# Patient Record
Sex: Female | Born: 1980
Health system: Southern US, Community
[De-identification: ages and names within clinical notes are randomized; demographics above are authoritative.]

## PROBLEM LIST (undated history)

## (undated) DIAGNOSIS — O24419 Gestational diabetes mellitus in pregnancy, unspecified control: Secondary | ICD-10-CM

## (undated) DIAGNOSIS — M25569 Pain in unspecified knee: Secondary | ICD-10-CM

## (undated) DIAGNOSIS — E119 Type 2 diabetes mellitus without complications: Secondary | ICD-10-CM

## (undated) DIAGNOSIS — F319 Bipolar disorder, unspecified: Secondary | ICD-10-CM

## (undated) DIAGNOSIS — F32A Depression, unspecified: Secondary | ICD-10-CM

## (undated) DIAGNOSIS — F329 Major depressive disorder, single episode, unspecified: Secondary | ICD-10-CM

## (undated) DIAGNOSIS — M549 Dorsalgia, unspecified: Secondary | ICD-10-CM

## (undated) HISTORY — PX: NO PAST SURGERIES: SHX2092

---
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
## 2009-07-13 ENCOUNTER — Emergency Department (HOSPITAL_COMMUNITY): Admission: EM | Admit: 2009-07-13 | Discharge: 2009-07-13 | Payer: Self-pay | Admitting: Emergency Medicine

## 2009-07-13 ENCOUNTER — Inpatient Hospital Stay (HOSPITAL_COMMUNITY): Admission: AD | Admit: 2009-07-13 | Discharge: 2009-07-15 | Payer: Self-pay | Admitting: Psychiatry

## 2009-07-13 ENCOUNTER — Ambulatory Visit: Payer: Self-pay | Admitting: Psychiatry

## 2009-11-04 ENCOUNTER — Ambulatory Visit: Payer: Self-pay | Admitting: Internal Medicine

## 2009-11-09 ENCOUNTER — Ambulatory Visit (HOSPITAL_COMMUNITY): Admission: RE | Admit: 2009-11-09 | Discharge: 2009-11-09 | Payer: Self-pay | Admitting: Family Medicine

## 2009-12-09 ENCOUNTER — Ambulatory Visit: Payer: Self-pay | Admitting: Internal Medicine

## 2009-12-09 ENCOUNTER — Encounter (INDEPENDENT_AMBULATORY_CARE_PROVIDER_SITE_OTHER): Payer: Self-pay | Admitting: Family Medicine

## 2009-12-09 LAB — CONVERTED CEMR LAB
ALT: 30 units/L (ref 0–35)
AST: 19 units/L (ref 0–37)
Albumin: 5.1 g/dL (ref 3.5–5.2)
Alkaline Phosphatase: 83 units/L (ref 39–117)
BUN: 14 mg/dL (ref 6–23)
Basophils Absolute: 0 10*3/uL (ref 0.0–0.1)
Basophils Relative: 0 % (ref 0–1)
CO2: 23 meq/L (ref 19–32)
Calcium: 9.6 mg/dL (ref 8.4–10.5)
Chloride: 104 meq/L (ref 96–112)
Cholesterol: 216 mg/dL — ABNORMAL HIGH (ref 0–200)
Creatinine, Ser: 0.54 mg/dL (ref 0.40–1.20)
Eosinophils Absolute: 0.1 10*3/uL (ref 0.0–0.7)
Eosinophils Relative: 1 % (ref 0–5)
Glucose, Bld: 92 mg/dL (ref 70–99)
HCT: 40.4 % (ref 36.0–46.0)
HDL: 48 mg/dL (ref >39)
Hemoglobin: 13.6 g/dL (ref 12.0–15.0)
LDL Cholesterol: 145 mg/dL — ABNORMAL HIGH (ref 0–99)
Lymphocytes Relative: 31 % (ref 12–46)
Lymphs Abs: 1.8 10*3/uL (ref 0.7–4.0)
MCHC: 33.7 g/dL (ref 30.0–36.0)
MCV: 94.4 fL (ref 78.0–100.0)
Monocytes Absolute: 0.4 10*3/uL (ref 0.1–1.0)
Monocytes Relative: 7 % (ref 3–12)
Neutro Abs: 3.5 10*3/uL (ref 1.7–7.7)
Neutrophils Relative %: 60 % (ref 43–77)
Platelets: 299 10*3/uL (ref 150–400)
Potassium: 3.9 meq/L (ref 3.5–5.3)
RBC: 4.28 M/uL (ref 3.87–5.11)
RDW: 13.2 % (ref 11.5–15.5)
Sodium: 139 meq/L (ref 135–145)
Total Bilirubin: 0.4 mg/dL (ref 0.3–1.2)
Total CHOL/HDL Ratio: 4.5
Total Protein: 7.9 g/dL (ref 6.0–8.3)
Triglycerides: 114 mg/dL (ref <150)
VLDL: 23 mg/dL (ref 0–40)
WBC: 5.7 10*3/uL (ref 4.0–10.5)

## 2009-12-18 ENCOUNTER — Ambulatory Visit: Payer: Self-pay | Admitting: Internal Medicine

## 2010-01-18 ENCOUNTER — Ambulatory Visit: Payer: Self-pay | Admitting: Internal Medicine

## 2010-05-04 ENCOUNTER — Ambulatory Visit: Payer: Self-pay | Admitting: Internal Medicine

## 2010-12-15 LAB — RAPID URINE DRUG SCREEN, HOSP PERFORMED
Amphetamines: NOT DETECTED
Barbiturates: NOT DETECTED
Benzodiazepines: NOT DETECTED
Cocaine: NOT DETECTED
Opiates: NOT DETECTED
Tetrahydrocannabinol: NOT DETECTED

## 2010-12-15 LAB — URINALYSIS, ROUTINE W REFLEX MICROSCOPIC
Bilirubin Urine: NEGATIVE
Glucose, UA: NEGATIVE mg/dL
Hgb urine dipstick: NEGATIVE
Ketones, ur: 15 mg/dL — AB
Nitrite: NEGATIVE
Protein, ur: NEGATIVE mg/dL
Specific Gravity, Urine: 1.018 (ref 1.005–1.030)
Urobilinogen, UA: 0.2 mg/dL (ref 0.0–1.0)
pH: 6 (ref 5.0–8.0)

## 2010-12-15 LAB — BASIC METABOLIC PANEL
CO2: 23 mEq/L (ref 19–32)
Chloride: 112 mEq/L (ref 96–112)
GFR calc Af Amer: >60 mL/min (ref >60)
Potassium: 3.6 mEq/L (ref 3.5–5.1)
Sodium: 144 mEq/L (ref 135–145)

## 2010-12-15 LAB — CBC
HCT: 37.5 % (ref 36.0–46.0)
Hemoglobin: 13.2 g/dL (ref 12.0–15.0)
MCHC: 35.2 g/dL (ref 30.0–36.0)
MCV: 91.2 fL (ref 78.0–100.0)
RBC: 4.11 MIL/uL (ref 3.87–5.11)
WBC: 7.4 10*3/uL (ref 4.0–10.5)

## 2010-12-15 LAB — ETHANOL: Alcohol, Ethyl (B): 122 mg/dL — ABNORMAL HIGH (ref 0–10)

## 2011-01-28 NOTE — Discharge Summary (Signed)
[facility]  Patient:    Catherine Morris, Catherine Morris Visit Number: [id number] MRN: [id number]          Service Type: OBS Location: 910D [id number] [id number] Attending Physician:  Antionette Char Dictated by:   Lucille Passy, M.D. Admit Date:  09/06/2001 Disc. Date: 09/10/01                             Discharge Summary  DATE OF BIRTH:                04-Sep-1981  DISCHARGE DIAGNOSES:          1. Cervicitis.                               2. Intrauterine pregnancy at 28 weeks.  DISCHARGE MEDICATIONS:        1. Augmentin 500 mg p.o. b.i.d. x 4 days.                               2. Flagyl 500 mg p.o. b.i.d. x 4 days.  HISTORY OF PRESENT ILLNESS:   Catherine Morris is an 30 year old, G1, P0, who presented at 28 weeks and 2 days to the maternal admissions unit with lower abdominal pain and vaginal bleeding.  She had good fetal movement,  no fluid leakage, and was not feeling strong contractions.  She had had no prenatal care.  The cervix was closed, long, and firm on sterile vaginal exam and a wet prep showed a few yeast, many clue cells, moderate wbcs, and bacteria too numerous to count with bleeding secondary to cervical friability. Fetal heart tones on admission were reassuring.  HOSPITAL COURSE:              The patient was admitted for IV antibiotics and was placed on ampicillin as the hospital pharmacy was out of Unasyn.  She was stable during the hospitalization, feeling no contractions, and her cervix remained closed, long, and thick.  She completed a 72-hour course of ampicillin IV.  On the day of discharge, her cervix was still closed, so she was discharged on a four-day course of both Augmentin and Flagyl b.i.d.  An appointment was made for her to follow up one week after discharge at the [facility] prior to discharge. Dictated by:   Lucille Passy, M.D. Attending Physician:  Antionette Char DD:  09/10/01 TD:   09/10/01 Job: [id number]

## 2014-01-14 ENCOUNTER — Encounter (HOSPITAL_COMMUNITY): Payer: Self-pay | Admitting: Emergency Medicine

## 2014-01-14 ENCOUNTER — Emergency Department (INDEPENDENT_AMBULATORY_CARE_PROVIDER_SITE_OTHER)
Admission: EM | Admit: 2014-01-14 | Discharge: 2014-01-14 | Disposition: A | Discharge status: Home / Self Care | Payer: Self-pay | Source: Home / Self Care

## 2014-01-14 DIAGNOSIS — R519 Headache, unspecified: Secondary | ICD-10-CM

## 2014-01-14 DIAGNOSIS — R51 Headache: Secondary | ICD-10-CM

## 2014-01-14 HISTORY — DX: Depression, unspecified: F32.A

## 2014-01-14 HISTORY — DX: Pain in unspecified knee: M25.569

## 2014-01-14 HISTORY — DX: Major depressive disorder, single episode, unspecified: F32.9

## 2014-01-14 HISTORY — DX: Dorsalgia, unspecified: M54.9

## 2014-01-14 HISTORY — DX: Bipolar disorder, unspecified: F31.9

## 2014-01-14 MED ORDER — KETOROLAC TROMETHAMINE 60 MG/2ML IM SOLN
60.0000 mg | Freq: Once | INTRAMUSCULAR | Status: AC
  Administered 2014-01-14: 60 mg via INTRAMUSCULAR

## 2014-01-14 MED ORDER — ONDANSETRON 4 MG PO TBDP
4.0000 mg | ORAL_TABLET | Freq: Once | ORAL | Status: AC
  Administered 2014-01-14: 4 mg via ORAL

## 2014-01-14 MED ORDER — DEXAMETHASONE SODIUM PHOSPHATE 10 MG/ML IJ SOLN
5.0000 mg | Freq: Once | INTRAMUSCULAR | Status: AC
  Administered 2014-01-14: 5 mg via INTRAMUSCULAR

## 2014-01-14 MED ORDER — ONDANSETRON 4 MG PO TBDP
ORAL_TABLET | ORAL | Status: AC
  Filled 2014-01-14: qty 1

## 2014-01-14 MED ORDER — KETOROLAC TROMETHAMINE 60 MG/2ML IM SOLN
INTRAMUSCULAR | Status: AC
  Filled 2014-01-14: qty 2

## 2014-01-14 MED ORDER — DEXAMETHASONE SODIUM PHOSPHATE 10 MG/ML IJ SOLN
INTRAMUSCULAR | Status: AC
  Filled 2014-01-14: qty 1

## 2014-01-14 NOTE — Discharge Instructions (Signed)
Dolor de Pensions consultant, preguntas frecuentes y sus respuestas (Headaches, Frequently Asked Questions) CEFALEAS MIGRAOSAS P: Qu es la migraa? Qu la ocasiona? Cmo puedo tratarla? R: En general, la migraa comienza como un dolor apagado. Luego progresa hacia un dolor, constante, punzante y como un latido. Sentir Copy las sienes. Podr sentir Aeronautical engineer parte anterior o posterior de la cabeza, o en uno o ambos lados. El dolor suele estar acompaado de una combinacin de:  Nuseas.  Vmitos.  Sensibilidad a la luz y los ruidos. Algunas personas (un 15%) experimentan un aura (ver abajo) antes de un ataque. La causa de la migraa se debe a reacciones qumicas del cerebro. El tratamiento para la migraa puede incluir medicamentos de [place]. Tambin puede incluir tcnicas de [place]. Estas incluyen entrenamientos para la relajacin y biorretroalimentacin.  P: Qu es un aura? R: Alrededor del 15% de las personas con migraa tiene un "aura". Es una seal de sntomas neurolgicos que ocurren antes de un dolor de cabeza por migraas. Podr ver lneas onduladas o irregulares, puntos o luces parpadeantes. Podr experimentar visin de tnel o puntos ciegos en uno o ambos ojos. El aura puede incluir alucinaciones visuales o auditivas (algo que se imagina). Puede incluir trastornos en el olfato (como olores extraos), el tacto o el gusto. Entre otros sntomas se incluyen:  Adormecimiento.  Sensacin de hormigueo.  Dificultad para recordar o Tax adviser. Estos episodios neurolgicos pueden durar hasta 60 minutos. Los sntomas desaparecern a medida que el dolor de cabeza comience. P:Qu es un disparador? R: Ciertos factores fsicos o Best boy a "disparar" una migraa. Estos son:  Alimentos.  Cambios hormonales.  Clima.  Estrs. Es importante recordar que los disparadores son diferentes entre si. Para ayudar a prevenir ataques de migraas, necesitar  descubrir cules son los Engineer, civil (consulting). Lleve un diario sobre sus dolores de [place]. Este es un buen modo para descubrir los disparadores. El Visual merchandiser en el momento de hablar con el profesional acerca de su enfermedad. P: El clima afecta en las migraas? R: La luz solar, el calor, la humedad y lo cambios drsticos en la presin Doctor, hospital a, o "disparar" un ataque de migraa en [employer]. Pero estudios han demostrado que el clima no acta como disparador para todas las personas con [place]. P: Cul es la relacin entre la migraa y la hormonas? R: Las hormonas inician y [place] funciones corporales. Las hormonas [employer] balance en el cuerpo dentro de los constantes cambios de [place]. Algunas veces, el nivel de hormonas en el cuerpo se desbalancea. Por ejemplo, durante la menstruacin, el embarazo o la [place]. Pueden ser la causa de un ataque de migraa. De hecho, alrededor de tres cuartos de las mujeres con migraa informan que sus ataques estn relacionados con el ciclo menstrual.  P: Aumenta el riesgo de sufrir un choque cardaco en las personas que padecen migraa? R: La probabilidad de que un ataque de migraa ocasione un ataque cardaco es muy remota. Esto no quiere Google persona que sufre de migraa no pueda tener un ataque cardaco asociado con ella. En las personas menores de 40 aos, el factor ms comn para un ataque es la [place]. Pero durante la vida de una persona, la ocurrencia de un dolor de cabeza por migraa est asociada con una reduccin en el riesgo de morir por un ataque cerebrovascular.  P: Cules son los medicamentos para la migraa? R: La  medicación precisa se utiliza para tratar el dolor de cabeza una vez que ha comenzado. Son ejemplos, medicamentos de venta libre, desinflamatorios sin esteroides, ergotamínicos y triptanos.  °P: ¿Qué son los triptanos? °R: Lo triptanos son una nueva clase de  medicamentos abortivos. Son específicos para tratar este problema. Los triptanos son vasoconstrictores. Moderan algunas reacciones químicas del cerebro. Los triptanos trabajan como receptores del cerebro. Ayudan a restaurar el balance de un neurotransmisor denominado serotonina. Se cree que las fluctuaciones en los niveles de serotonina son la causa principal de la migraña.  °P: ¿Son efectivos los medicamentos de venta libre para la migraña? °R: Los medicamentos de venta libre pueden ser efectivos para aliviar dolores leves a moderados y los síntomas asociados a la migraña. Pero deberá consultar a un médico antes de comenzar cualquier tratamiento para la migraña.  °P: ¿Cuáles son los medicamentos de prevención de la migraña? °R: Se suele denominar tratamiento "profiláctico" a los medicamentos para la prevención de la migraña. Se utilizan para reducir la frecuencia, gravedad y duración de los ataques de migraña. Son ejemplos de medicamentos de prevención: antiepilépticos, antidepresivos, bloqueadores beta, bloqueadores de los canales de calcio y medicamentos antiinflamatorios sin esteroides. °P: ¿ Por qué se utilizan anticonvulsivantes para tratar la migraña? °R: Durante los últimos años, ha habido un creciente interés en las drogas antiepilépticas para la prevención de la migraña. A menudo se los conoce como "anticonvulsivantes". La epilepsia y la migraña suceden por reacciones similares en el cerebro.  °P: ¿ Por qué se utilizan antidepresivos para tratar la migraña? °R: Los antidepresivos típicamente se utilizan para tratar a las personas con depresión. Pueden reducir la frecuencia de la migraña a través de la regulación de los niveles químicos, como la serotonina, en el cerebro.  °P: ¿ Por qué se utilizan terapias alternativas para tratar la migraña? °R: El término "terapias alternativas" suelen utilizarse para describir los tratamientos que se considera que están por fuera de alcance la medicina occidental  convencional. Son ejemplos de las terapias alternativas: la acupuntura, la acupresión y el yoga. Otra terapia alternativa común es la terapia herbal. Se cree que algunas hierbas ayudan a aliviar los dolores de cabeza. Siempre consulte con el profesional acerca de las terapias alternativas antes de utilizarlas. Algunos productos herbales contienen arsénico y otras toxinas. °DOLORES DE CABEZA POR TENSIÓN °P: ¿Qué es un dolor de cabeza por tensión? ¿Qué lo ocasiona? ¿Cómo puedo tratarlo? °R: Los dolores de cabeza por tensión ocurren al azar. A menudo son el resultado de estrés temporario, ansiedad, fatiga o ira. Los síntomas incluyen dolor en las sienes, una sensación como de tener una banda alrededor de la cabeza (un dolor que "presiona"). Los síntomas pueden incluir una sensación de empuje, de presión y contracción de los músculos de la cabeza y el cuello. El dolor comienza en la frente, sienes o en la parte posterior de la cabeza y el cuello. El tratamiento para los dolores de cabeza por tensión puede incluir medicamentos de venta libre. También puede incluir técnicas de autoayuda con entrenamientos para la relajación y biorretroalimentación. °CEFALEA EN RACIMOS °P: ¿Qué es una cefalea en racimos? ¿Qué la ocasiona? ¿Cómo puedo tratarla? °R: La cefalea en racimos toma su nombre debido a que los ataques vienen en grupos. El dolor aparece con poco o ningún aviso. Normalmente ocurre de un lado de la cabeza. Muchas veces el dolor viene acompañado de un lagrimeo u ojo rojo y goteo de la nariz del mismo lado que el dolor. Se cree que la causa es   una reacción en las sustancias químicas del cerebro. Se describe como el caso más grave e intenso de cualquier tipo de dolor de cabeza. El tratamiento incluye medicamentos bajo receta y oxígeno. °CEFALEA SINUSAL °P: ¿Qué es una cefalea sinusal? ¿Qué la ocasiona? ¿Cómo puedo tratarla? °R: Cuando se inflama una cavidad en los huesos de la cara y el cráneo (sinus) ocasiona un dolor  localizado. Esta enfermedad generalmente es el resultado de una reacción alérgica, un tumor o una infección. Si el dolor de cabeza está ocasionado por un bloqueo del sinus, como una infección, probablemente tendrá fiebre. Una imagen de rayos X confirmará el bloqueo del sinus. El tratamiento indicado por el médico podrá incluir antibióticos para la infección, y también antihistamínicos o descongestivos.  °DOLOR DE CABEZA POR EFECTO "REBOTE" °P: ¿Qué es un dolor de cabeza por efecto "rebote"? ¿Qué lo ocasiona? ¿Cómo puedo tratarlo? °R: Si se toman medicamentos para el dolor de cabeza muy a menudo puede llevar a la enfermedad conocida como "dolor de cabeza por rebote". Un patrón de abuso de medicamentos para el dolor de cabeza supone tomarlos más de dos veces por semana o en cantidades excesivas. Esto significa más que lo que indica el envase o el médico. Con los dolores de cabeza por rebote, los medicamentos no sólo dejan de aliviar el dolor sino que además comienzan a ocasionar dolores de cabeza. Los médicos tratan los dolores de cabeza por rebote mediante la disminución del medicamento del que se ha abusado. A veces el medico podrá sustituir gradualmente por un tipo diferente de tratamiento o medicación. Dejar de consumirlo podría ser difícil. El abuso regular de un medicamento aumenta el potencial que se produzcan efectos secundarios graves. Consulte con un médico si utiliza regularmente medicamentos para el dolor de cabeza más de dos días por semana o más de lo que indica el envase. °PREGUNTAS Y RESPUESTAS ADICIONALES °P: ¿Qué es la biorretroalimentación? °R: La biorretroalimentación es un tratamiento de autoayuda. La biorretroalimentación utiliza un equipamiento especial para controlar los movimientos involuntarios del cuerpo y las respuestas físicas. La biorretroalimentación controla: °· Respiración. °· Pulso. °· Latidos cardíacos. °· Temperatura. °· Tensión muscular. °· Actividad cerebrales. °La  biorretroalimentación le ayudará a mejorar y perfeccionar sus ejercicios de relajación. Aprenderá a controlar las respuestas físicas relacionadas con el estrés. Una vez que se dominan las técnicas no necesitará más el equipamiento. °P: ¿Son hereditarios los dolores de cabeza? °R: Según algunas estimaciones, aproximadamente 28 millones de estadounidenses sufren migraña. Cuatro de cada cinco (80%) informan una historia familiar de migraña. Los investigadores no pueden asegurar si se trata de un problema genético o una predisposición familiar. A pesar de esto, un niño tiene 50% de probabilidades de sufrir migraña si uno de sus padres la sufre. El niño tiene un 75% de probabilidades si ambos padres la sufren.  °P. ¿Puede un niño tener migraña? °R: En el momento de ingresar a la escuela secundaria, la mayoría de los jóvenes han experimentado algún tipo de cefalea. Algunos abordajes o medicamentos seguros y efectivos pueden evitar las cefaleas o detenerlas luego de que han comenzado.  °P. ¿Qué tipo de especialista debe ver para diagnosticar y tratar una cefalea? °R: Comience con su médico de cabecera. Converse acerca de su experiencia y abordaje de las cefaleas. Comente los métodos de clasificación, diagnóstico y tratamiento. El profesional decidirá si lo derivará a un especialista, según los síntomas u otras enfermedades. El hecho de sufrir diabetes, alergias, etc, puede requerir un abordaje más complejo. La National Headache Foundation (Fundación Nacional   para las [address]efaleas) proporcionar, a pedido, Agricultural engineeruna lista de los mdicos que son miembros de [place] estado. Document Released: 08/11/2008 Document Revised: 11/21/2011 [facility] Patient Information 2014 FlemingExitCare, MarylandLLC.  Cefalea de rebote (Headaches, Analgesic Rebound) Los analgsicos son medicamentos de prescripcin o de venta libre que se Insurance risk surveyorutilizan para Human resources officercontrolar el dolor, que incluye el dolor de Turkmenistancabeza. Sin embargo, el uso excesivo o el mal uso de estos medicamentos  puede producir cefaleas de rebote. Las cefaleas de rebote son cefaleas que vuelven a ocurrir una vez que se va el efecto del medicamento analgsico. [place] CityFinalmente, las cefaleas de rebote se vuelven trastornos de larga duracin (crnicos). Si esto ocurre, deber dejar de tomar medicamentos analgsicos. Si no lo hace, es probable que la cefalea crnica contine incluso si realiza Midwifecualquier otro tratamiento. Generalmente, al dejar de tomar analgsicos, la cefalea puede inicialmente empeorar durante varios [place]. Adems, podr sentir Programme researcher, broadcasting/film/videomalestar estomacal (nuseas), y podr tener vmitos. Luego de un perodo de 3 a 5 das, estos sntomas comienzan a Scientist, clinical (histocompatibility and immunogenetics)mejorar. En ocasiones la mejora puede llevar ms tiempo. Finalmente, la cefalea mejorar lentamente con un tratamiento basado en los medicamentos adecuados. La mayor parte de las personas puede dejar de Science writertomar analgsicos en su hogar con la supervisin de un profesional. Algunas personas pueden tener dificultades y pueden requerir la hospitalizacin. Document Released: 11/25/2008 Document Revised: 11/21/2011 [facility]Exit[facility] Patient Information 2014 [place] LakesExitCare, MarylandLLC.

## 2014-01-14 NOTE — ED Provider Notes (Signed)
Medical screening examination/treatment/procedure(s) were performed by resident physician or non-physician practitioner and as supervising physician I was immediately available for consultation/collaboration.   Jamil Armwood DOUGLAS MD.   Jamario Colina D Hailyn Zarr, MD 01/14/14 1634 

## 2014-01-14 NOTE — ED Notes (Signed)
Pt c/o frequent headaches. This episode onset yesterday afternoon. Patient reports she took tramadol with no relief. Patient is alert and oriented and in no acute distress.

## 2014-01-14 NOTE — ED Provider Notes (Signed)
CSN: [id number]     Arrival date & time 01/14/14  1210 History   First MD Initiated Contact with Patient 01/14/14 1357     Chief Complaint  Patient presents with  . Headache   (Consider location/radiation/quality/duration/timing/severity/associated sxs/prior Treatment) HPI Comments: 33 year old Hispanic female complaining of headache that worsened yesterday afternoon. It is located primarily across the frontal lobe radiating bilaterally to the temples and parietal. It is described as a throbbing pain. It is present most of the time. It occurs upon awakening, last drink today and the evening. She has chronic daily headaches for 5 years. This headache is similar but worse than previous headaches. Denies any recent trauma. The headache is worse when in upright lites such as the son. She denies nausea, vomiting, focal weakness, problems with vision, speech, hearing, swallowing or other neurologic problems. She has been seen by her PCP and given tramadol but that has not helped. The last time she saw her PCP was over a year ago and has not been back.   Past Medical History  Diagnosis Date  . Back pain   . Knee pain   . Depression   . Bipolar disorder    No past surgical history on file. No family history on file. History  Substance Use Topics  . Smoking status: Not on file  . Smokeless tobacco: Not on file  . Alcohol Use: Not on file   OB History   Grav Para Term Preterm Abortions TAB SAB Ect Mult Living                 Review of Systems  Constitutional: Positive for activity change. Negative for fever and fatigue.  HENT: Negative for congestion, ear pain, nosebleeds, rhinorrhea, sinus pressure and sore throat.   Eyes: Negative.   Respiratory: Negative.   Cardiovascular: Negative.   Gastrointestinal: Negative for nausea, vomiting and abdominal pain.  Genitourinary: Negative.   Musculoskeletal: Negative.   Skin: Negative.   Neurological: Positive for headaches. Negative for  dizziness, tremors, seizures, syncope, speech difficulty, weakness, light-headedness and numbness.  Psychiatric/Behavioral: Negative.     Allergies  Review of patient's allergies indicates no known allergies.  Home Medications   Prior to Admission medications   Medication Sig Start Date End Date Taking? Authorizing Provider  ALPRAZolam Prudy Feeler(XANAX) 0.25 MG tablet Take 0.25 mg by mouth at bedtime as needed for anxiety.    Historical Provider, MD  FLUoxetine (PROZAC) 10 MG capsule Take 10 mg by mouth daily.    Historical Provider, MD  traMADol (ULTRAM) 50 MG tablet Take 50 mg by mouth every 6 (six) hours as needed.    Historical Provider, MD   BP 110/72  Pulse 71  Temp(Src) 98.7 F (37.1 C) (Oral)  Resp 18  SpO2 100% Physical Exam  Nursing note and vitals reviewed. Constitutional: She is oriented to person, place, and time. She appears well-developed and well-nourished. No distress.  HENT:  Head: Normocephalic and atraumatic.  Mouth/Throat: Oropharynx is clear and moist. No oropharyngeal exudate.  Bilateral TMs are normal Tenderness over the temples and portions of the lower parietal scalp.   Eyes: Conjunctivae and EOM are normal. Pupils are equal, round, and reactive to light.  Neck: Normal range of motion. Neck supple. No thyromegaly present.  Cardiovascular: Normal rate, normal heart sounds and intact distal pulses.   No murmur heard. Pulmonary/Chest: Effort normal and breath sounds normal. No respiratory distress. She has no wheezes. She has no rales.  Abdominal: Soft. There is no tenderness. There  is no rebound.  Musculoskeletal: Normal range of motion. She exhibits no edema.  Lymphadenopathy:    She has no cervical adenopathy.  Neurological: She is alert and oriented to person, place, and time. She has normal strength. No cranial nerve deficit or sensory deficit. She exhibits normal muscle tone. She displays a negative Romberg sign. Coordination and gait normal. GCS eye subscore  is 4. GCS verbal subscore is 5. GCS motor subscore is 6.  Skin: Skin is warm and dry.  Psychiatric: She has a normal mood and affect.    ED Course  Procedures (including critical care time) Labs Review Labs Reviewed - No data to display  Imaging Review No results found.   MDM   1. Headache, chronic daily     Chronic daily headaches suspect associated with tension and possibly rebound. Patient is instructed to followup with her PCP's request referral to a neurologist. There is no evidence of neurologic deficits today nor in her history. We will treat with Toradol 60 mg and Decadron 5 mg IM, Zofran 4 mg by mouth. Headache instructions to include red flags. For any worsening in symptoms or problems to the emergency department.     Hayden Rasmussenavid Rosalynn Sergent, NP 01/14/14 1450

## 2014-02-20 ENCOUNTER — Ambulatory Visit: Payer: Self-pay | Attending: Internal Medicine | Admitting: Internal Medicine

## 2014-02-20 ENCOUNTER — Encounter: Payer: Self-pay | Admitting: Internal Medicine

## 2014-02-20 VITALS — BP 109/74 | HR 81 | Temp 98.7°F | Resp 17 | Wt 154.2 lb

## 2014-02-20 DIAGNOSIS — R519 Headache, unspecified: Secondary | ICD-10-CM

## 2014-02-20 DIAGNOSIS — M25569 Pain in unspecified knee: Secondary | ICD-10-CM | POA: Insufficient documentation

## 2014-02-20 DIAGNOSIS — F3289 Other specified depressive episodes: Secondary | ICD-10-CM | POA: Insufficient documentation

## 2014-02-20 DIAGNOSIS — Z139 Encounter for screening, unspecified: Secondary | ICD-10-CM | POA: Insufficient documentation

## 2014-02-20 DIAGNOSIS — F341 Dysthymic disorder: Secondary | ICD-10-CM

## 2014-02-20 DIAGNOSIS — F411 Generalized anxiety disorder: Secondary | ICD-10-CM | POA: Insufficient documentation

## 2014-02-20 DIAGNOSIS — R51 Headache: Secondary | ICD-10-CM | POA: Insufficient documentation

## 2014-02-20 DIAGNOSIS — F329 Major depressive disorder, single episode, unspecified: Secondary | ICD-10-CM | POA: Insufficient documentation

## 2014-02-20 DIAGNOSIS — F419 Anxiety disorder, unspecified: Secondary | ICD-10-CM

## 2014-02-20 DIAGNOSIS — M25561 Pain in right knee: Secondary | ICD-10-CM | POA: Insufficient documentation

## 2014-02-20 DIAGNOSIS — M25562 Pain in left knee: Secondary | ICD-10-CM

## 2014-02-20 LAB — CBC WITH DIFFERENTIAL/PLATELET
Basophils Absolute: 0 10*3/uL (ref 0.0–0.1)
Basophils Relative: 0 % (ref 0–1)
EOS ABS: 0.1 10*3/uL (ref 0.0–0.7)
Eosinophils Relative: 1 % (ref 0–5)
HCT: 37.8 % (ref 36.0–46.0)
Hemoglobin: 13.1 g/dL (ref 12.0–15.0)
LYMPHS ABS: 2.4 10*3/uL (ref 0.7–4.0)
LYMPHS PCT: 33 % (ref 12–46)
MCH: 30.4 pg (ref 26.0–34.0)
MCHC: 34.7 g/dL (ref 30.0–36.0)
MCV: 87.7 fL (ref 78.0–100.0)
Monocytes Absolute: 0.5 10*3/uL (ref 0.1–1.0)
Monocytes Relative: 7 % (ref 3–12)
NEUTROS PCT: 59 % (ref 43–77)
Neutro Abs: 4.3 10*3/uL (ref 1.7–7.7)
PLATELETS: 345 10*3/uL (ref 150–400)
RBC: 4.31 MIL/uL (ref 3.87–5.11)
RDW: 13.4 % (ref 11.5–15.5)
WBC: 7.3 10*3/uL (ref 4.0–10.5)

## 2014-02-20 LAB — HEMOGLOBIN A1C
HEMOGLOBIN A1C: 5.9 % — AB (ref <5.7)
MEAN PLASMA GLUCOSE: 123 mg/dL — AB (ref <117)

## 2014-02-20 MED ORDER — BUTALBITAL-APAP-CAFFEINE 50-325-40 MG PO TABS: 1.0000 | ORAL_TABLET | Freq: Four times a day (QID) | ORAL | Status: DC | PRN

## 2014-02-20 NOTE — Progress Notes (Signed)
Patient here with interpreter Complains of having headaches past five years Last scan of her head was in 2012 Stated she gets headaches everyday

## 2014-02-20 NOTE — Progress Notes (Signed)
Patient Demographics  Catherine Morris, is a 33 y.o. female  ZOX:[id number]  [id number]:[id number]  DOB - 1981/04/01  CC:  Chief Complaint  Patient presents with  . Headache       HPI: Catherine Morris is a 33 y.o. female here today to establish medical care. Patient has history of anxiety/depression following up with her her psychiatrist and is on Prozac Xanax, she also history of chronic headaches and has been taking Topamax 100 mg each bedtime as per patient is to let doesn't help her with the symptoms she denies any numbness weakness, she also reported to have bilateral knee pain for the more than 1 year, also history of fall in the past. As per patient she also recently had a Pap smear done and she is waiting on the results. Patient has No headache, No chest pain, No abdominal pain - No Nausea, No new weakness tingling or numbness, No Cough - SOB.  No Known Allergies Past Medical History  Diagnosis Date  . Back pain   . Knee pain   . Depression   . Bipolar disorder    Current Outpatient Prescriptions on File Prior to Visit  Medication Sig Dispense Refill  . ALPRAZolam (XANAX) 0.25 MG tablet Take 0.25 mg by mouth at bedtime as needed for anxiety.      Marland Kitchen. FLUoxetine (PROZAC) 10 MG capsule Take 10 mg by mouth daily.      . traMADol (ULTRAM) 50 MG tablet Take 50 mg by mouth every 6 (six) hours as needed.       No current facility-administered medications on file prior to visit.   Family History  Problem Relation Age of Onset  . Hypertension Mother   . Diabetes Father    History   Social History  . Marital Status: Single    Spouse Name: N/A    Number of Children: N/A  . Years of Education: N/A   Occupational History  . Not on file.   Social History Main Topics  . Smoking status: Never Smoker   . Smokeless tobacco: Not on file  . Alcohol Use: No  . Drug Use: Not on file  . Sexual Activity: Not on file   Other Topics Concern  . Not on file    Social History Narrative  . No narrative on file    Review of Systems: Constitutional: Negative for fever, chills, diaphoresis, activity change, appetite change and fatigue. HENT: Negative for ear pain, nosebleeds, congestion, facial swelling, rhinorrhea, neck pain, neck stiffness and ear discharge.  Eyes: Negative for pain, discharge, redness, itching and visual disturbance. Respiratory: Negative for cough, choking, chest tightness, shortness of breath, wheezing and stridor.  Cardiovascular: Negative for chest pain, palpitations and leg swelling. Gastrointestinal: Negative for abdominal distention. Genitourinary: Negative for dysuria, urgency, frequency, hematuria, flank pain, decreased urine volume, difficulty urinating and dyspareunia.  Musculoskeletal: Negative for back pain, joint swelling, arthralgia and gait problem. Neurological: Negative for dizziness, tremors, seizures, syncope, facial asymmetry, speech difficulty, weakness, light-headedness, numbness and headaches.  Hematological: Negative for adenopathy. Does not bruise/bleed easily. Psychiatric/Behavioral: Negative for hallucinations, behavioral problems, confusion, dysphoric mood, decreased concentration and agitation.    Objective:   Filed Vitals:   02/20/14 1201  BP: 109/74  Pulse: 81  Temp: 98.7 F (37.1 C)  Resp: 17    Physical Exam: Constitutional: Patient appears well-developed and well-nourished. No distress. HENT: Normocephalic, atraumatic, External right and left ear normal. Oropharynx is clear and moist.  Eyes: Conjunctivae and EOM are  normal. PERRLA, no scleral icterus. Neck: Normal ROM. Neck supple. No JVD. No tracheal deviation. No thyromegaly. CVS: RRR, S1/S2 +, no murmurs, no gallops, no carotid bruit.  Pulmonary: Effort and breath sounds normal, no stridor, rhonchi, wheezes, rales.  Abdominal: Soft. BS +, no distension, tenderness, rebound or guarding.  Musculoskeletal: Normal range of motion. No  edema and no tenderness. Left knee  Crepitation+  Neuro: Alert. Normal reflexes, muscle tone coordination. No cranial nerve deficit. Skin: Skin is warm and dry. No rash noted. Not diaphoretic. No erythema. No pallor. Psychiatric: Normal mood and affect. Behavior, judgment, thought content normal.  Lab Results  Component Value Date   WBC 5.7 12/09/2009   HGB 13.6 12/09/2009   HCT 40.4 12/09/2009   MCV 94.4 12/09/2009   PLT 299 12/09/2009   Lab Results  Component Value Date   CREATININE 0.54 12/09/2009   BUN 14 12/09/2009   NA 139 12/09/2009   K 3.9 12/09/2009   CL 104 12/09/2009   CO2 23 12/09/2009    No results found for this basename: HGBA1C   Lipid Panel     Component Value Date/Time   CHOL 216* 12/09/2009 2138   TRIG 114 12/09/2009 2138   HDL 48 12/09/2009 2138   CHOLHDL 4.5 Ratio 12/09/2009 2138   VLDL 23 12/09/2009 2138   LDLCALC 145* 12/09/2009 2138       Assessment and plan:   1. Anxiety and depression Following up with the psychiatrist and is on Xanax and Prozac.  2. Chronic headache Currently patient is on Topamax 100 mg each bedtime, prescribed Fioricet when necessary also refer to neurology - Ambulatory referral to Neurology - butalbital-acetaminophen-caffeine (ESGIC) 50-325-40 MG per tablet; Take 1 tablet by mouth every 6 (six) hours as needed for headache.  Dispense: 30 tablet; Refill: 0  3. Bilateral knee pain  - DG Knee Bilateral Standing AP; Future  4. Screening Baseline blood work  - CBC with Differential - COMPLETE METABOLIC PANEL WITH GFR - TSH - Vit D  25 hydroxy (rtn osteoporosis monitoring) - Hemoglobin A1c is  Return in about 3 months (around 05/23/2014) for Headache.  Doris Cheadle, MD

## 2014-02-21 ENCOUNTER — Telehealth: Payer: Self-pay

## 2014-02-21 LAB — COMPLETE METABOLIC PANEL WITH GFR
ALT: 23 U/L (ref 0–35)
AST: 18 U/L (ref 0–37)
Albumin: 4.4 g/dL (ref 3.5–5.2)
Alkaline Phosphatase: 109 U/L (ref 39–117)
BILIRUBIN TOTAL: 0.2 mg/dL (ref 0.2–1.2)
BUN: 12 mg/dL (ref 6–23)
CO2: 20 meq/L (ref 19–32)
Calcium: 9.8 mg/dL (ref 8.4–10.5)
Chloride: 106 mEq/L (ref 96–112)
Creat: 0.78 mg/dL (ref 0.50–1.10)
GFR, Est Non African American: >89 mL/min
Glucose, Bld: 87 mg/dL (ref 70–99)
Potassium: 4.4 mEq/L (ref 3.5–5.3)
SODIUM: 137 meq/L (ref 135–145)
TOTAL PROTEIN: 7.3 g/dL (ref 6.0–8.3)

## 2014-02-21 LAB — TSH: TSH: 1.517 u[IU]/mL (ref 0.350–4.500)

## 2014-02-21 LAB — VITAMIN D 25 HYDROXY (VIT D DEFICIENCY, FRACTURES): Vit D, 25-Hydroxy: 28 ng/mL — ABNORMAL LOW (ref 30–89)

## 2014-02-21 NOTE — Telephone Encounter (Signed)
Message copied by Lestine MountJUAREZ, Josef Tourigny L on Fri Feb 21, 2014 10:36 AM ------      Message from: Doris CheadleADVANI, DEEPAK      Created: Fri Feb 21, 2014  9:41 AM       Blood work reviewed, noticed low vitamin D, advise patient to start taking OTC 2000 units daily.      Also noticed her hemoglobin A1c is 5.9%, patient has prediabetes, advise for low carbohydrate diet. ------

## 2014-02-21 NOTE — Telephone Encounter (Signed)
Interpreter line used Patient not available Unable to leave message due to the fact the voice mail in not set up

## 2014-03-31 ENCOUNTER — Encounter: Payer: Self-pay | Admitting: Neurology

## 2014-03-31 ENCOUNTER — Ambulatory Visit (INDEPENDENT_AMBULATORY_CARE_PROVIDER_SITE_OTHER): Payer: Self-pay | Admitting: Neurology

## 2014-03-31 VITALS — BP 110/82 | HR 76 | Resp 16 | Ht 61.5 in | Wt 153.0 lb

## 2014-03-31 DIAGNOSIS — G4440 Drug-induced headache, not elsewhere classified, not intractable: Secondary | ICD-10-CM | POA: Insufficient documentation

## 2014-03-31 DIAGNOSIS — G43719 Chronic migraine without aura, intractable, without status migrainosus: Secondary | ICD-10-CM

## 2014-03-31 MED ORDER — TOPIRAMATE 50 MG PO TABS: ORAL_TABLET | ORAL | Status: DC

## 2014-03-31 MED ORDER — SUMATRIPTAN SUCCINATE 100 MG PO TABS: 100.0000 mg | ORAL_TABLET | Freq: Once | ORAL | Status: DC | PRN

## 2014-03-31 NOTE — Patient Instructions (Signed)
1.  Stop Topamax 100mg  tablets.  I will prescribe 50mg  tablets.  Take 2 1/2 tablets at bedtime for 7 days, then 3 tablets at bedtime.  Note that topamax may decrease efficacy of birth control. 2.  Take sumatriptan 100mg  at earliest onset of headache.  May repeat a pill in 2 hours if needed.  Do not exceed 2 pills in 24 hour period.  Stop tramadol.  Do not take any pain relievers more than 2 days out of the week. 3.  Try to cut out caffeine if possible. 4.  Take folic acid 1mg  daily. 5.  Follow up in 2 months.  Call in 4 weeks with update and we can increase topamax further if needed. 6.  Keep headache diary to record when you get a headache.

## 2014-03-31 NOTE — Progress Notes (Signed)
NEUROLOGY CONSULTATION NOTE  [name] MRN: [id number] DOB: 06/18/1981  Referring provider: Dr. Orpah CobbAdvani Primary care provider: Dr. Orpah CobbAdvani  Reason for consult:  Headache  HISTORY OF PRESENT ILLNESS: Catherine Morris is a 33 year old right-handed Spanish-speaking woman with history of depression and back pain who presents for headache.  Spanish interpreter present. Records reviewed.  Onset:  5-7 years ago Location:  Varies. Sometimes right-sided, sometimes left sided, sometimes frontal Quality:  Pounding Intensity:  Moderate 6/10, severe 10 out of 10 Aura:  No Prodrome:  Sensation of wave in her head Associated symptoms:  Photophobia, phonophobia, sometimes osmophobia. No nausea. Duration:  3-5 hours Frequency:  23 headache days per month (8-10 severe headache days per month) Triggers/exacerbating factors:  No Relieving factors:  No Activity:  Unable to function with severe headaches  Past abortive therapy:  Tylenol ineffective, Excedrin ineffective, Advil ineffective, Aleve ineffective, Maxalt caused dizziness Past preventative therapy:  None  Current abortive therapy:  Butalbital-acetaminophen-caffeine (takes 1-2 days per week), tramadol 50 mg twice daily Current preventative therapy:  Topamax 100 mg at bedtime (initially helpful) Other current medications:  alprazolam 0.25mg  at bedtime, fluoxetine 10mg , tramadol 50mg   Caffeine:  1 cup coffee daily, sometimes soda Alcohol:  No Smoker:  No Diet:  Eats food and junk food Exercise:  No Depression/stress:  Depression Sleep hygiene:  Varies Family history of headache:  Niece's has headaches  11/09/09 CT HEAD WO (for daily headaches):  Normal.  About a month ago, she said she fell getting out of the bath and hit the back of her head. There was no loss of consciousness. She has no focal symptoms.  PAST MEDICAL HISTORY: Past Medical History  Diagnosis Date  . Back pain   . Knee pain   .  Depression   . Bipolar disorder     PAST SURGICAL HISTORY: No past surgical history on file.  MEDICATIONS: Current Outpatient Prescriptions on File Prior to Visit  Medication Sig Dispense Refill  . butalbital-acetaminophen-caffeine (ESGIC) 50-325-40 MG per tablet Take 1 tablet by mouth every 6 (six) hours as needed for headache.  30 tablet  0  . traMADol (ULTRAM) 50 MG tablet Take 50 mg by mouth every 6 (six) hours as needed.       No current facility-administered medications on file prior to visit.    ALLERGIES: No Known Allergies  FAMILY HISTORY: Family History  Problem Relation Age of Onset  . Hypertension Mother   . Diabetes Father     SOCIAL HISTORY: History   Social History  . Marital Status: Single    Spouse Name: N/A    Number of Children: N/A  . Years of Education: N/A   Occupational History  . Not on file.   Social History Main Topics  . Smoking status: Never Smoker   . Smokeless tobacco: Not on file  . Alcohol Use: No  . Drug Use: Not on file  . Sexual Activity: Not on file   Other Topics Concern  . Not on file   Social History Narrative  . No narrative on file    REVIEW OF SYSTEMS: Constitutional: No fevers, chills, or sweats, no generalized fatigue, change in appetite Eyes: No visual changes, double vision, eye pain Ear, nose and throat: No hearing loss, ear pain, nasal congestion, sore throat Cardiovascular: No chest pain, palpitations Respiratory:  No shortness of breath at rest or with exertion, wheezes GastrointestinaI: No nausea, vomiting, diarrhea, abdominal pain, fecal incontinence Genitourinary:  No dysuria, urinary retention  or frequency Musculoskeletal:  No neck pain, back pain Integumentary: No rash, pruritus, skin lesions Neurological: as above Psychiatric: No depression, insomnia, anxiety Endocrine: No palpitations, fatigue, diaphoresis, mood swings, change in appetite, change in weight, increased thirst Hematologic/Lymphatic:   No anemia, purpura, petechiae. Allergic/Immunologic: no itchy/runny eyes, nasal congestion, recent allergic reactions, rashes  PHYSICAL EXAM: Filed Vitals:   03/31/14 1040  BP: 110/82  Pulse: 76  Resp: 16   General: No acute distress Head:  Normocephalic/atraumatic Neck: supple, no paraspinal tenderness, full range of motion Back: No paraspinal tenderness Heart: regular rate and rhythm Lungs: Clear to auscultation bilaterally. Vascular: No carotid bruits. Neurological Exam: Mental status: alert and oriented to person, place, and time, recent and remote memory intact, fund of knowledge intact, attention and concentration intact, speech fluent and not dysarthric, language intact. Cranial nerves: CN I: not tested CN II: pupils equal, round and reactive to light, visual fields intact, fundi unremarkable, without vessel changes, exudates, hemorrhages or papilledema. CN III, IV, VI:  full range of motion, no nystagmus, no ptosis CN V: facial sensation intact CN VII: upper and lower face symmetric CN VIII: hearing intact CN IX, X: gag intact, uvula midline CN XI: sternocleidomastoid and trapezius muscles intact CN XII: tongue midline Bulk & Tone: normal, no fasciculations. Motor: 5 out of 5 throughout Sensation: Temperature and vibration intact Deep Tendon Reflexes: 2+ throughout, toes downgoing Finger to nose testing: No dysmetria Heel to shin: No dysmetria Gait: Normal station and stride. Able to turn and walk in tandem. Romberg negative.  IMPRESSION: 1. 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 Medication overuse headache  PLAN: 1. We'll titrate Topamax to 125 mg at bedtime for one week and then 150 mg at bedtime. Discussed the effects on birth control medications. Advised to take 1 mg folic acid daily. 2. For abortive therapy, will try sumatriptan 100 mg. Advised to stop tramadol and butalbital. Limit use of pain relievers to no more than 2 days out of the week. 3. Improve exercise  and diet 4. She is to call in 4 weeks with an update and we can adjust medications if needed. Followup in 2 months.  Thank you for allowing me to take part in the care of this patient.  Shon Millet, DO  CC: Doris Cheadle, MD

## 2014-05-23 ENCOUNTER — Encounter: Payer: Self-pay | Admitting: Internal Medicine

## 2014-05-23 ENCOUNTER — Ambulatory Visit: Payer: Self-pay | Attending: Internal Medicine | Admitting: Internal Medicine

## 2014-05-23 VITALS — BP 104/68 | HR 74 | Temp 98.5°F | Resp 16 | Ht 61.0 in | Wt 151.0 lb

## 2014-05-23 DIAGNOSIS — R51 Headache: Secondary | ICD-10-CM

## 2014-05-23 DIAGNOSIS — E559 Vitamin D deficiency, unspecified: Secondary | ICD-10-CM

## 2014-05-23 DIAGNOSIS — R7309 Other abnormal glucose: Secondary | ICD-10-CM

## 2014-05-23 DIAGNOSIS — F329 Major depressive disorder, single episode, unspecified: Secondary | ICD-10-CM

## 2014-05-23 DIAGNOSIS — F419 Anxiety disorder, unspecified: Secondary | ICD-10-CM

## 2014-05-23 DIAGNOSIS — R7303 Prediabetes: Secondary | ICD-10-CM | POA: Insufficient documentation

## 2014-05-23 DIAGNOSIS — F341 Dysthymic disorder: Secondary | ICD-10-CM

## 2014-05-23 DIAGNOSIS — G8929 Other chronic pain: Secondary | ICD-10-CM

## 2014-05-23 NOTE — Progress Notes (Signed)
LCSW met with patient in order to assess emotional needs and link patient to support services.  Patient stated that she has been working with a Nature conservation officer but is having difficulty affording the costs. LCSW recommended that patient seek psychiatry services at local agencies that provide that without insurance.  Patient stated that she also needed counseling and LCSW encouraged patient to also request counseling services from those same agencies.  Patient encouraged to follow up with this LCSW as needed for mental or emotional support.   Christene Lye MSW, LCSW

## 2014-05-23 NOTE — Progress Notes (Signed)
Pt here withc/o migraine headaches unrelieved with prescribed Imitrex and pain medication Pt states medication is causing "loopy" feeling Pt was prescribed medication per Neurology. States she has f/u appt 06/01/14 Spanish interpretor present

## 2014-05-23 NOTE — Patient Instructions (Signed)
La diabetes mellitus y los alimentos (Diabetes Mellitus and Food) Es importante que controle su nivel de azcar en la sangre (glucosa). El nivel de glucosa en sangre depende en gran medida de lo que usted come. Comer alimentos saludables en las cantidades adecuadas a lo largo del da, aproximadamente a la misma hora todos los das, lo ayudar a controlar su nivel de glucosa en sangre. Tambin puede ayudarlo a retrasar o evitar el empeoramiento de la diabetes mellitus. Comer de manera saludable incluso puede ayudarlo a mejorar el nivel de presin arterial y a alcanzar o mantener un peso saludable.  CMO PUEDEN AFECTARME LOS ALIMENTOS? Carbohidratos Los carbohidratos afectan el nivel de glucosa en sangre ms que cualquier otro tipo de alimento. El nutricionista lo ayudar a determinar cuntos carbohidratos puede consumir en cada comida y ensearle a contarlos. El recuento de carbohidratos es importante para mantener la glucosa en sangre en un nivel saludable, en especial si utiliza insulina o toma determinados medicamentos para la diabetes mellitus. Alcohol El alcohol puede provocar disminuciones sbitas de la glucosa en sangre (hipoglucemia), en especial si utiliza insulina o toma determinados medicamentos para la diabetes mellitus. La hipoglucemia es una afeccin que puede poner en peligro la vida. Los sntomas de la hipoglucemia (somnolencia, mareos y desorientacin) son similares a los sntomas de haber consumido mucho alcohol.  Si el mdico lo autoriza a beber alcohol, hgalo con moderacin y siga estas pautas:  Las mujeres no deben beber ms de un trago por da, y los hombres no deben beber ms de dos tragos por da. Un trago es igual a:  12 onzas (355 ml) de cerveza  5 onzas de vino (150 ml) de vino  1,5onzas (45ml) de bebidas espirituosas  No beba con el estmago vaco.  Mantngase hidratado. Beba agua, gaseosas dietticas o t helado sin azcar.  Las gaseosas comunes, los jugos y  otros refrescos podran contener muchos carbohidratos y se deben contar. QU ALIMENTOS NO SE RECOMIENDAN? Cuando haga las elecciones de alimentos, es importante que recuerde que todos los alimentos son distintos. Algunos tienen menos nutrientes que otros por porcin, aunque podran tener la misma cantidad de caloras o carbohidratos. Es difcil darle al cuerpo lo que necesita cuando consume alimentos con menos nutrientes. Estos son algunos ejemplos de alimentos que debera evitar ya que contienen muchas caloras y carbohidratos, pero pocos nutrientes:  Grasas trans (la mayora de los alimentos procesados incluyen grasas trans en la etiqueta de Informacin nutricional).  Gaseosas comunes.  Jugos.  Caramelos.  Dulces, como tortas, pasteles, rosquillas y galletas.  Comidas fritas. QU ALIMENTOS PUEDO COMER? Consuma alimentos ricos en nutrientes, que nutrirn el cuerpo y lo mantendrn saludable. Los alimentos que debe comer tambin dependern de varios factores, como:  Las caloras que necesita.  Los medicamentos que toma.  Su peso.  El nivel de glucosa en sangre.  El nivel de presin arterial.  El nivel de colesterol. Tambin debe consumir una variedad de alimentos, como:  Protenas, como carne, aves, pescado, tofu, frutos secos y semillas (las protenas de animales magros son mejores).  Frutas.  Verduras.  Productos lcteos, como leche, queso y yogur (descremados son mejores).  Panes, granos, pastas, cereales, arroz y frijoles.  Grasas, como aceite de oliva, margarina sin grasas trans, aceite de canola, aguacate y aceitunas. TODOS LOS QUE PADECEN DIABETES MELLITUS TIENEN EL MISMO PLAN DE COMIDAS? Dado que todas las personas que padecen diabetes mellitus son distintas, no hay un solo plan de comidas que funcione para todos. Es muy   importante que se rena con un nutricionista que lo ayudar a crear un plan de comidas adecuado para usted. Document Released: 12/06/2007  Document Revised: 09/03/2013 [employer] Patient Information 2015 [employer]. This information is not intended to replace advice given to you by your health care provider. Make sure you discuss any questions you have with your health care provider.  

## 2014-05-23 NOTE — Progress Notes (Signed)
MRN: [id number] Name: Catherine Morris  Sex: female Age: 33 y.o. DOB: 1981-01-14  Allergies: Review of patient's allergies indicates no known allergies.  Chief Complaint  Patient presents with  . Follow-up  . Migraine    HPI: Patient is 33 y.o. female who has history of chronic headaches currently following up with the neurologist she is on Topamax and Imitrex, she also has history of anxiety and depression was following her with the psychiatrist in the past and is taking Prozac and Xanax, she had a blood work done which was reviewed with the patient noticed vitamin D deficiency as well as impaired fasting glucose with A1c of 5.9%, patient does report family history of diabetes.  Past Medical History  Diagnosis Date  . Back pain   . Knee pain   . Depression   . Bipolar disorder     History reviewed. No pertinent past surgical history.    Medication List       This list is accurate as of: 05/23/14 11:19 AM.  Always use your most recent med list.               ALPRAZolam 1 MG tablet  Commonly known as:  XANAX  Take 1 mg by mouth 2 (two) times daily as needed for anxiety.     butalbital-acetaminophen-caffeine 50-325-40 MG per tablet  Commonly known as:  ESGIC  Take 1 tablet by mouth every 6 (six) hours as needed for headache.     FLUoxetine 40 MG capsule  Commonly known as:  PROZAC  Take 40 mg by mouth 2 (two) times daily.     SUMAtriptan 100 MG tablet  Commonly known as:  IMITREX  Take 1 tablet (100 mg total) by mouth once as needed for migraine or headache. May repeat x1 in 2hrs if needed.  Do not exceed 2pills in 24h     topiramate 50 MG tablet  Commonly known as:  TOPAMAX  Take 2.5tabs qhs x7d, then 3tabs qhs.     traMADol 50 MG tablet  Commonly known as:  ULTRAM  Take 50 mg by mouth every 6 (six) hours as needed.        No orders of the defined types were placed in this encounter.     There is no immunization history on file for this  patient.  Family History  Problem Relation Age of Onset  . Hypertension Mother   . Diabetes Father   . Diabetes Brother     History  Substance Use Topics  . Smoking status: Never Smoker   . Smokeless tobacco: Not on file  . Alcohol Use: No    Review of Systems   As noted in HPI  Filed Vitals:   05/23/14 1052  BP: 104/68  Pulse: 74  Temp: 98.5 F (36.9 C)  Resp: 16    Physical Exam  Physical Exam  Eyes: EOM are normal. Pupils are equal, round, and reactive to light.  Neck: Neck supple.  Cardiovascular: Normal rate and regular rhythm.   Pulmonary/Chest: Breath sounds normal. No respiratory distress. She has no wheezes. She has no rales.  Musculoskeletal: She exhibits no edema.  Psychiatric:  tearfull anxious    CBC    Component Value Date/Time   WBC 7.3 02/20/2014 1227   RBC 4.31 02/20/2014 1227   HGB 13.1 02/20/2014 1227   HCT 37.8 02/20/2014 1227   PLT 345 02/20/2014 1227   MCV 87.7 02/20/2014 1227   LYMPHSABS 2.4 02/20/2014 1227  MONOABS 0.5 02/20/2014 1227   EOSABS 0.1 02/20/2014 1227   BASOSABS 0.0 02/20/2014 1227    CMP     Component Value Date/Time   NA 137 02/20/2014 1227   K 4.4 02/20/2014 1227   CL 106 02/20/2014 1227   CO2 20 02/20/2014 1227   GLUCOSE 87 02/20/2014 1227   BUN 12 02/20/2014 1227   CREATININE 0.78 02/20/2014 1227   CREATININE 0.54 12/09/2009 2138   CALCIUM 9.8 02/20/2014 1227   PROT 7.3 02/20/2014 1227   ALBUMIN 4.4 02/20/2014 1227   AST 18 02/20/2014 1227   ALT 23 02/20/2014 1227   ALKPHOS 109 02/20/2014 1227   BILITOT 0.2 02/20/2014 1227   GFRNONAA >89 02/20/2014 1227   GFRNONAA >60 07/13/2009 0248   GFRAA >89 02/20/2014 1227   GFRAA  Value: >60        The eGFR has been calculated using the MDRD equation. This calculation has not been validated in all clinical situations. eGFR's persistently <60 mL/min signify possible Chronic Kidney Disease. 07/13/2009 0248    Lab Results  Component Value Date/Time   CHOL 216* 12/09/2009  9:38 PM     No components found with this basename: hga1c    Lab Results  Component Value Date/Time   AST 18 02/20/2014 12:27 PM    Assessment and Plan  Chronic nonintractable headache, unspecified headache type Currently on Topamax and Imitrex when necessary and following up with the neurologist.  Unspecified vitamin D deficiency Advise patient take over-the-counter vitamin D 2000 units daily.  Prediabetes Her A1c is 5.9% and does report family history of diabetes, I have advised patient for diabetes meal planning.  Anxiety and depression Currently on Prozac and Xanax was following up with the psychiatrist, recently has not been able to followup due to financial and social issues, patient will be scheduled appointment with social worker.   Return in about 3 months (around 08/22/2014) for prediabetes.  Lorayne Marek, MD

## 2014-06-02 ENCOUNTER — Encounter: Payer: Self-pay | Admitting: Neurology

## 2014-06-02 ENCOUNTER — Ambulatory Visit (INDEPENDENT_AMBULATORY_CARE_PROVIDER_SITE_OTHER): Payer: Self-pay | Admitting: Neurology

## 2014-06-02 VITALS — BP 102/64 | HR 78 | Resp 16 | Wt 149.1 lb

## 2014-06-02 DIAGNOSIS — G43009 Migraine without aura, not intractable, without status migrainosus: Secondary | ICD-10-CM

## 2014-06-02 MED ORDER — DICLOFENAC POTASSIUM 50 MG PO TABS: 50.0000 mg | ORAL_TABLET | Freq: Three times a day (TID) | ORAL | Status: DC | PRN

## 2014-06-02 NOTE — Progress Notes (Signed)
NEUROLOGY FOLLOW UP OFFICE NOTE  Catherine Morris [id number]  HISTORY OF PRESENT ILLNESS: Catherine Morris is a 33 year old right-handed Spanish-speaking woman with history of depression, Bipolar disorder and back pain who follows up for chronic migraines without aura.  She is accompanied by an interpretor  UPDATE: Intensity:  Moderate 6/10; severe 10/10 Duration:  1 day Frequency:  5 moderate headaches over the past 2 months.  5 straight days of severe headache last week.  Current abortive therapy:  butalbital-acetaminophen-caffeine. Current preventative therapy:  Topamax  (  caused increased sleepiness). Other current medications:  alprazolam 0.25mg  at bedtime, fluoxetine   HISTORY: Onset:  5-7 years ago Location:  Varies. Sometimes right-sided, sometimes left sided, sometimes frontal Quality:  Pounding Initial intensity:  Moderate 6/10, severe 10 out of 10 Aura:  No Prodrome:  Sensation of wave in her head Associated symptoms:  Photophobia, phonophobia, sometimes osmophobia. No nausea. Initial duration:  3-5 hours Initial frequency:  23 headache days per month (8-10 severe headache days per month) Triggers/exacerbating factors:  No Relieving factors:  No Activity:  Unable to function with severe headaches  Past abortive therapy:  Tylenol ineffective, Excedrin ineffective, Advil ineffective, Aleve ineffective, Maxalt caused dizziness, tramadol (overuse), butalbital combo (overuse), sumatriptan  (made her hallucinate). Past preventative therapy:  None  Caffeine:  1 cup coffee daily, sometimes soda Alcohol:  No Smoker:  No Diet:  Eats food and junk food Exercise:  No Depression/stress:  Depression Sleep hygiene:  Varies Family history of headache:  Niece's has headaches  11/09/09 CT HEAD WO (for daily headaches):  Normal.  PAST MEDICAL HISTORY: Past Medical History  Diagnosis Date  . Back pain   . Knee pain   . Depression   .  Bipolar disorder     MEDICATIONS: Current Outpatient Prescriptions on File Prior to Visit  Medication Sig Dispense Refill  . ALPRAZolam (XANAX) 1 MG tablet Take 1 mg by mouth 2 (two) times daily as needed for anxiety.      Marland Kitchen FLUoxetine (PROZAC) 40 MG capsule Take 40 mg by mouth 2 (two) times daily.      Marland Kitchen topiramate (TOPAMAX) 50 MG tablet Take 2.5tabs qhs x7d, then 3tabs qhs.  90 tablet  0  . butalbital-acetaminophen-caffeine (ESGIC) 50-325-40 MG per tablet Take 1 tablet by mouth every 6 (six) hours as needed for headache.  30 tablet  0  . SUMAtriptan (IMITREX) 100 MG tablet Take 1 tablet (100 mg total) by mouth once as needed for migraine or headache. May repeat x1 in 2hrs if needed.  Do not exceed 2pills in 24h  9 tablet  2  . traMADol (ULTRAM) 50 MG tablet Take 50 mg by mouth every 6 (six) hours as needed.       No current facility-administered medications on file prior to visit.    ALLERGIES: No Known Allergies  FAMILY HISTORY: Family History  Problem Relation Age of Onset  . Hypertension Mother   . Diabetes Father   . Diabetes Brother     SOCIAL HISTORY: History   Social History  . Marital Status: Single    Spouse Name: N/A    Number of Children: N/A  . Years of Education: N/A   Occupational History  . Not on file.   Social History Main Topics  . Smoking status: Never Smoker   . Smokeless tobacco: Not on file  . Alcohol Use: No  . Drug Use: No  . Sexual Activity: Yes    Partners: Male  Other Topics Concern  . Not on file   Social History Narrative  . No narrative on file    REVIEW OF SYSTEMS: Constitutional: No fevers, chills, or sweats, no generalized fatigue, change in appetite Eyes: No visual changes, double vision, eye pain Ear, nose and throat: No hearing loss, ear pain, nasal congestion, sore throat Cardiovascular: No chest pain, palpitations Respiratory:  No shortness of breath at rest or with exertion, wheezes GastrointestinaI: No nausea,  vomiting, diarrhea, abdominal pain, fecal incontinence Genitourinary:  No dysuria, urinary retention or frequency Musculoskeletal:  No neck pain, back pain Integumentary: No rash, pruritus, skin lesions Neurological: as above Psychiatric: No depression, insomnia, anxiety Endocrine: No palpitations, fatigue, diaphoresis, mood swings, change in appetite, change in weight, increased thirst Hematologic/Lymphatic:  No anemia, purpura, petechiae. Allergic/Immunologic: no itchy/runny eyes, nasal congestion, recent allergic reactions, rashes  PHYSICAL EXAM: Filed Vitals:   06/02/14 1150  BP: 102/64  Pulse: 78  Resp: 16   General: No acute distress Head:  Normocephalic/atraumatic Neck: supple, no paraspinal tenderness, full range of motion Heart:  Regular rate and rhythm Lungs:  Clear to auscultation bilaterally Back: No paraspinal tenderness Neurological Exam: alert and oriented to person, place, and time. Attention span and concentration intact, recent and remote memory intact, fund of knowledge intact.  Speech fluent and not dysarthric, language intact.  CN II-XII intact. Fundoscopic exam unremarkable without vessel changes, exudates, hemorrhages or papilledema.  Bulk and tone normal, muscle strength 5/5 throughout.  Sensation to light touch, temperature and vibration intact.  Deep tendon reflexes 2+ throughout, toes downgoing.  Finger to nose and heel to shin testing intact.  Gait normal, Romberg negative.  IMPRESSION: Chronic migraines without aura.  PLAN: 1.  For abortive therapy, will try diclofenac potassium . 2.  Continue topamax  for now, since frequency has markedly improved 3.  Stop butalbital combination (I do not recommend use of butalbital-containing medications due to high propensity for medication-overuse headaches) 4.  Follow up in 3 months but instructed to call sooner with questions or concerns (such as need to change medication).  Shon Millet, DO  CC:  Doris Cheadle, MD

## 2014-06-02 NOTE — Patient Instructions (Signed)
1.  Continue Topamax  at bedtime 2.  When you get a headache, at the earliest onset, take diclofenac potassium .  May take up to three times daily as needed. 3.  If you have increased headaches or if the diclofenac does not work or causes side effects, please let us know 4.  Follow up in 3 months.

## 2014-06-23 ENCOUNTER — Ambulatory Visit: Payer: Self-pay | Attending: Internal Medicine

## 2014-06-25 ENCOUNTER — Telehealth: Payer: Self-pay | Admitting: Neurology

## 2014-06-25 NOTE — Telephone Encounter (Signed)
Pt called to give an update on the medication she is currently taking. She is still having headaches. C/B [phone]

## 2014-06-25 NOTE — Telephone Encounter (Signed)
I would start atenolol 50mg  every morning.  She should continue the topamax at this time.  For abortive therapy, we can try Relpax 40mg  at earliest onset of headache and may repeat once in 2 hours if needed.  Should not take any pain reliever more than 2 days out of the week.  Call in 4 weeks with update.

## 2014-06-25 NOTE — Telephone Encounter (Signed)
Patient called stating her medication is not working  She is having headache more often her pain reliever does work for about 3 hours after taking then the headache is back she has had a headache about 12 days during this month . Please advise

## 2014-06-27 ENCOUNTER — Other Ambulatory Visit: Payer: Self-pay | Admitting: *Deleted

## 2014-06-27 NOTE — Telephone Encounter (Signed)
I spoke with patient she wishes to wait until the 30 days is up before adding another medication she states today her headache is less

## 2014-07-11 ENCOUNTER — Other Ambulatory Visit: Payer: Self-pay | Admitting: *Deleted

## 2014-07-11 ENCOUNTER — Telehealth: Payer: Self-pay | Admitting: Neurology

## 2014-07-11 MED ORDER — DICLOFENAC POTASSIUM 50 MG PO TABS: 50.0000 mg | ORAL_TABLET | Freq: Three times a day (TID) | ORAL | Status: DC

## 2014-07-11 NOTE — Telephone Encounter (Signed)
Pt called requesting a refill for DICLOFENAC 50mg  [facility] [address] [phone]

## 2014-07-11 NOTE — Telephone Encounter (Signed)
Medication sent to pharmacy  

## 2014-09-01 ENCOUNTER — Encounter: Payer: Self-pay | Admitting: Neurology

## 2014-09-01 ENCOUNTER — Ambulatory Visit (INDEPENDENT_AMBULATORY_CARE_PROVIDER_SITE_OTHER): Payer: Self-pay | Admitting: Neurology

## 2014-09-01 VITALS — BP 116/74 | HR 70 | Temp 98.3°F | Resp 16 | Ht 61.0 in | Wt 146.4 lb

## 2014-09-01 DIAGNOSIS — G43719 Chronic migraine without aura, intractable, without status migrainosus: Secondary | ICD-10-CM

## 2014-09-01 DIAGNOSIS — G4440 Drug-induced headache, not elsewhere classified, not intractable: Secondary | ICD-10-CM

## 2014-09-01 DIAGNOSIS — G4441 Drug-induced headache, not elsewhere classified, intractable: Secondary | ICD-10-CM

## 2014-09-01 MED ORDER — ELETRIPTAN HYDROBROMIDE 40 MG PO TABS: ORAL_TABLET | ORAL | Status: DC

## 2014-09-01 MED ORDER — PREDNISONE 10 MG PO TABS: ORAL_TABLET | ORAL | Status: DC

## 2014-09-01 MED ORDER — ATENOLOL 50 MG PO TABS: ORAL_TABLET | ORAL | Status: DC

## 2014-09-01 NOTE — Patient Instructions (Signed)
1.  In addition to topamax, start atenolol 50mg  tablet.  Take 1/2 tablet daily for 7 days, then increase to 1 full tablet daily.  Call in 4 weeks with update and we can change dose. 2.  Stop diclofenac.  Instead, take relpax at earliest onset of headache.  May repeat once in 2 hours if needed.   3.  Do not take medications like relpax or over the counter medications more than 2 days out of the week to treat headache. 4.  Continue topamax 100mg  daily 5.  I will prescribe you a steroid (prednisone) taper to try and break the daily headache. Take 6tabs x1day, then 5tabs x1day, then 4tabs x1day, then 3tabs x1day, then 2tabs x1day, then 1tab x1day, then STOP 6.  Follow up in 3 months.

## 2014-09-01 NOTE — Progress Notes (Signed)
NEUROLOGY FOLLOW UP OFFICE NOTE  Catherine Morris [id number]  HISTORY OF PRESENT ILLNESS: Catherine Morris is a 33 year old right-handed Spanish-speaking woman with history of depression, Bipolar disorder and back pain who follows up for chronic migraines without aura.  She is accompanied by an interpretor  UPDATE: She was doing well two months ago, only with 10 headache days per month.  However, she has been having them daily for the past month. Intensity:  9/10 Duration:  All day  Current abortive therapy:  Diclofenac.  She was taking 1 daily at first, which helped.  Now she has to take 2 daily. Current preventative therapy:  Topamax 100mg  (150mg  caused increased sleepiness).  Other current medications:  alprazolam 0.25mg  at bedtime, fluoxetine 10mg   Caffeine:  1 cup coffee daily, sometimes soda Alcohol:  No Smoker:  No Diet:  Eats food and junk food Exercise:  No Depression/stress:  Depression Sleep hygiene:  Varies  HISTORY: Onset:  5-7 years ago Location:  Varies. Sometimes right-sided, sometimes left sided, sometimes frontal Quality:  Pounding Initial intensity:  Moderate 6/10, severe 10 out of 10; September Moderate 6/10; severe 10/10 Aura:  No Prodrome:  Sensation of wave in her head Associated symptoms:  Photophobia, phonophobia, sometimes osmophobia. No nausea. Initial duration:  3-5 hours; September 1 day Initial frequency:  23 headache days per month (8-10 severe headache days per month); September 5 moderate headaches over the past 2 months.  5 straight days of severe headache last week. Triggers/exacerbating factors:  No Relieving factors:  No Activity:  Unable to function with severe headaches  Past abortive therapy:  Tylenol ineffective, Excedrin ineffective, Advil ineffective, Aleve ineffective, Maxalt caused dizziness, tramadol (overuse), butalbital combo (overuse), sumatriptan 100mg  (made her hallucinate), Diclofenac potassium  50mg  Past preventative therapy:  None  Family history of headache:  Niece's has headaches  11/09/09 CT HEAD WO (for daily headaches):  Normal.  PAST MEDICAL HISTORY: Past Medical History  Diagnosis Date  . Back pain   . Knee pain   . Depression   . Bipolar disorder     MEDICATIONS: Current Outpatient Prescriptions on File Prior to Visit  Medication Sig Dispense Refill  . ALPRAZolam (XANAX) 1 MG tablet Take 1 mg by mouth 2 (two) times daily as needed for anxiety.    . diclofenac (CATAFLAM) 50 MG tablet Take 1 tablet (50 mg total) by mouth 3 (three) times daily as needed. 30 tablet 0  . diclofenac (CATAFLAM) 50 MG tablet Take 1 tablet (50 mg total) by mouth 3 (three) times daily. 90 tablet 1  . FLUoxetine (PROZAC) 40 MG capsule Take 40 mg by mouth 2 (two) times daily.    Marland Kitchen. topiramate (TOPAMAX) 50 MG tablet Take 2.5tabs qhs x7d, then 3tabs qhs. 90 tablet 0  . butalbital-acetaminophen-caffeine (ESGIC) 50-325-40 MG per tablet Take 1 tablet by mouth every 6 (six) hours as needed for headache. (Patient not taking: Reported on 09/01/2014) 30 tablet 0  . SUMAtriptan (IMITREX) 100 MG tablet Take 1 tablet (100 mg total) by mouth once as needed for migraine or headache. May repeat x1 in 2hrs if needed.  Do not exceed 2pills in 24h (Patient not taking: Reported on 09/01/2014) 9 tablet 2  . traMADol (ULTRAM) 50 MG tablet Take 50 mg by mouth every 6 (six) hours as needed.     No current facility-administered medications on file prior to visit.    ALLERGIES: No Known Allergies  FAMILY HISTORY: Family History  Problem Relation Age of Onset  .  Hypertension Mother   . Diabetes Father   . Diabetes Brother     SOCIAL HISTORY: History   Social History  . Marital Status: Single    Spouse Name: N/A    Number of Children: N/A  . Years of Education: N/A   Occupational History  . Not on file.   Social History Main Topics  . Smoking status: Never Smoker   . Smokeless tobacco: Not on file   . Alcohol Use: No  . Drug Use: No  . Sexual Activity:    Partners: Male   Other Topics Concern  . Not on file   Social History Narrative  . No narrative on file    REVIEW OF SYSTEMS: Constitutional: No fevers, chills, or sweats, no generalized fatigue, change in appetite Eyes: No visual changes, double vision, eye pain Ear, nose and throat: No hearing loss, ear pain, nasal congestion, sore throat Cardiovascular: No chest pain, palpitations Respiratory:  No shortness of breath at rest or with exertion, wheezes GastrointestinaI: No nausea, vomiting, diarrhea, abdominal pain, fecal incontinence Genitourinary:  No dysuria, urinary retention or frequency Musculoskeletal:  No neck pain, back pain Integumentary: No rash, pruritus, skin lesions Neurological: as above Psychiatric: No depression, insomnia, anxiety Endocrine: No palpitations, fatigue, diaphoresis, mood swings, change in appetite, change in weight, increased thirst Hematologic/Lymphatic:  No anemia, purpura, petechiae. Allergic/Immunologic: no itchy/runny eyes, nasal congestion, recent allergic reactions, rashes  PHYSICAL EXAM: Filed Vitals:   09/01/14 1129  BP: 116/74  Pulse: 70  Temp: 98.3 F (36.8 C)  Resp: 16   General: No acute distress Head:  Normocephalic/atraumatic Eyes:  Fundoscopic exam unremarkable without vessel changes, exudates, hemorrhages or papilledema. Neck: supple, no paraspinal tenderness, full range of motion Heart:  Regular rate and rhythm Lungs:  Clear to auscultation bilaterally Back: No paraspinal tenderness Neurological Exam: alert and oriented to person, place, and time. Attention span and concentration intact, recent and remote memory intact, fund of knowledge intact.  Speech fluent and not dysarthric, language intact.  CN II-XII intact. Fundoscopic exam unremarkable without vessel changes, exudates, hemorrhages or papilledema.  Bulk and tone normal, muscle strength 5/5 throughout.   Sensation to light touch  intact.  Deep tendon reflexes 2+ throughout.  Finger to nose testing intact.  Gait normal, Romberg negative.  IMPRESSION: Chronic migraine without aura Medication-overuse headache  PLAN: 1.  Continue topamax 100mg .  Will start atenolol 25mg  daily x7d then increase to 50mg  daily. 2.  Stop diclofenac.  Instead, will try relpax 40mg  for abortive therapy. 3.  Prednisone taper to break cycle of daily headache 4.  Instructed to not take pain relievers more than 2 days out of the week 5.  Call in 4 weeks with update.  Follow up in 3 months.  Shon MilletAdam Jaffe, DO  CC: Doris Cheadleeepak Advani, MD

## 2014-09-08 ENCOUNTER — Ambulatory Visit (HOSPITAL_BASED_OUTPATIENT_CLINIC_OR_DEPARTMENT_OTHER): Payer: Self-pay

## 2014-09-08 ENCOUNTER — Encounter: Payer: Self-pay | Admitting: Internal Medicine

## 2014-09-08 ENCOUNTER — Ambulatory Visit: Payer: Self-pay | Attending: Internal Medicine | Admitting: Internal Medicine

## 2014-09-08 VITALS — BP 103/68 | HR 82 | Temp 98.0°F | Resp 16 | Wt 146.8 lb

## 2014-09-08 DIAGNOSIS — R7309 Other abnormal glucose: Secondary | ICD-10-CM | POA: Insufficient documentation

## 2014-09-08 DIAGNOSIS — R7303 Prediabetes: Secondary | ICD-10-CM

## 2014-09-08 DIAGNOSIS — R519 Headache, unspecified: Secondary | ICD-10-CM

## 2014-09-08 DIAGNOSIS — H538 Other visual disturbances: Secondary | ICD-10-CM | POA: Insufficient documentation

## 2014-09-08 DIAGNOSIS — Z23 Encounter for immunization: Secondary | ICD-10-CM

## 2014-09-08 DIAGNOSIS — E559 Vitamin D deficiency, unspecified: Secondary | ICD-10-CM | POA: Insufficient documentation

## 2014-09-08 DIAGNOSIS — R51 Headache: Secondary | ICD-10-CM | POA: Insufficient documentation

## 2014-09-08 DIAGNOSIS — Z7952 Long term (current) use of systemic steroids: Secondary | ICD-10-CM | POA: Insufficient documentation

## 2014-09-08 DIAGNOSIS — F329 Major depressive disorder, single episode, unspecified: Secondary | ICD-10-CM | POA: Insufficient documentation

## 2014-09-08 DIAGNOSIS — Z79899 Other long term (current) drug therapy: Secondary | ICD-10-CM | POA: Insufficient documentation

## 2014-09-08 LAB — POCT GLYCOSYLATED HEMOGLOBIN (HGB A1C): HEMOGLOBIN A1C: 6

## 2014-09-08 NOTE — Progress Notes (Signed)
MRN: [id number] Name: Catherine Morris  Sex: female Age: 33 y.o. DOB: 09/07/1981  Allergies: Review of patient's allergies indicates no known allergies.  Chief Complaint  Patient presents with  . Headache    HPI: Patient is 33 y.o. female who has to of chronic headaches, prediabetes, vitamin D deficiency, anxiety/depression comes today for followup as per patient she saw her neurologist one week ago and was started on new medication, she is prescribed atenolol which currently she is taking, she was also prescribed  Relpax which as per patient she could not afford, as per patient she is going to check on with her neurologist for different medication, today her A1c 6.0% slightly increased compared to last visit, I have advised patient to modify her diet, patient will like to get a flu shot today.Patient is also complaining of on and off blurry vision.  Past Medical History  Diagnosis Date  . Back pain   . Knee pain   . Depression   . Bipolar disorder     History reviewed. No pertinent past surgical history.    Medication List       This list is accurate as of: 09/08/14 11:50 AM.  Always use your most recent med list.               ALPRAZolam 1 MG tablet  Commonly known as:  XANAX  Take 1 mg by mouth 2 (two) times daily as needed for anxiety.     atenolol 50 MG tablet  Commonly known as:  TENORMIN  Take 0.5tab daily x7d, then increase to 1tab daily     butalbital-acetaminophen-caffeine 50-325-40 MG per tablet  Commonly known as:  ESGIC  Take 1 tablet by mouth every 6 (six) hours as needed for headache.     diclofenac 50 MG tablet  Commonly known as:  CATAFLAM  Take 1 tablet (50 mg total) by mouth 3 (three) times daily as needed.     diclofenac 50 MG tablet  Commonly known as:  CATAFLAM  Take 1 tablet (50 mg total) by mouth 3 (three) times daily.     eletriptan 40 MG tablet  Commonly known as:  RELPAX  Take1tab at earliest onset of headache. May  repeat x1 in 2 hours if headache persists or recurs.     FLUoxetine 40 MG capsule  Commonly known as:  PROZAC  Take 40 mg by mouth 2 (two) times daily.     predniSONE 10 MG tablet  Commonly known as:  DELTASONE  Take 6tabs x1day, then 5tabs x1day, then 4tabs x1day, then 3tabs x1day, then 2tabs x1day, then 1tab x1day, then STOP     SUMAtriptan 100 MG tablet  Commonly known as:  IMITREX  Take 1 tablet (100 mg total) by mouth once as needed for migraine or headache. May repeat x1 in 2hrs if needed.  Do not exceed 2pills in 24h     topiramate 50 MG tablet  Commonly known as:  TOPAMAX  Take 2.5tabs qhs x7d, then 3tabs qhs.     traMADol 50 MG tablet  Commonly known as:  ULTRAM  Take 50 mg by mouth every 6 (six) hours as needed.        No orders of the defined types were placed in this encounter.     There is no immunization history on file for this patient.  Family History  Problem Relation Age of Onset  . Hypertension Mother   . Diabetes Father   . Diabetes Brother  History  Substance Use Topics  . Smoking status: Never Smoker   . Smokeless tobacco: Not on file  . Alcohol Use: No    Review of Systems   As noted in HPI  Filed Vitals:   09/08/14 1123  BP: 103/68  Pulse: 82  Temp: 98 F (36.7 C)  Resp: 16    Physical Exam  Physical Exam  Constitutional: No distress.  Eyes: EOM are normal. Pupils are equal, round, and reactive to light.  Cardiovascular: Normal rate and regular rhythm.   Pulmonary/Chest: Breath sounds normal. No respiratory distress. She has no wheezes. She has no rales.  Musculoskeletal: She exhibits no edema.    CBC    Component Value Date/Time   WBC 7.3 02/20/2014 1227   RBC 4.31 02/20/2014 1227   HGB 13.1 02/20/2014 1227   HCT 37.8 02/20/2014 1227   PLT 345 02/20/2014 1227   MCV 87.7 02/20/2014 1227   LYMPHSABS 2.4 02/20/2014 1227   MONOABS 0.5 02/20/2014 1227   EOSABS 0.1 02/20/2014 1227   BASOSABS 0.0 02/20/2014 1227     CMP     Component Value Date/Time   NA 137 02/20/2014 1227   K 4.4 02/20/2014 1227   CL 106 02/20/2014 1227   CO2 20 02/20/2014 1227   GLUCOSE 87 02/20/2014 1227   BUN 12 02/20/2014 1227   CREATININE 0.78 02/20/2014 1227   CREATININE 0.54 12/09/2009 2138   CALCIUM 9.8 02/20/2014 1227   PROT 7.3 02/20/2014 1227   ALBUMIN 4.4 02/20/2014 1227   AST 18 02/20/2014 1227   ALT 23 02/20/2014 1227   ALKPHOS 109 02/20/2014 1227   BILITOT 0.2 02/20/2014 1227   GFRNONAA >89 02/20/2014 1227   GFRNONAA >60 07/13/2009 0248   GFRAA >89 02/20/2014 1227   GFRAA  07/13/2009 0248    >60        The eGFR has been calculated using the MDRD equation. This calculation has not been validated in all clinical situations. eGFR's persistently <60 mL/min signify possible Chronic Kidney Disease.    Lab Results  Component Value Date/Time   CHOL 216* 12/09/2009 09:38 PM    No components found for: HGA1C  Lab Results  Component Value Date/Time   AST 18 02/20/2014 12:27 PM    Assessment and Plan  Pre-diabetes - Plan:  Results for orders placed or performed in visit on 09/08/14  HgB A1c  Result Value Ref Range   Hemoglobin A1C 6.0    Advised patient for diabetes meal planning.  Chronic nonintractable headache, unspecified headache type Currently patient is taking atenolol and prednisone, she is going to check on with her neurologist for the prescription of Relpax which she cannot afford.  Needs flu shot Flu shot given today   Vitamin D deficiency Currently taking OTC vitamin D 1000 Units daily   Blurry vision - Plan: Ambulatory referral to Ophthalmology   Health Maintenance  -Vaccinations:  Flu shot today   Return in about 4 months (around 01/08/2015) for prediabetes.  Lorayne Marek, MD

## 2014-09-08 NOTE — Progress Notes (Signed)
Interpreter line used Patient here for follow up on her headaches Follows up with nuerologist Recently prescribed new medication for headaches but not able to afford it So she is not taking it( Relpax 40mg )

## 2014-09-08 NOTE — Patient Instructions (Signed)
La diabetes mellitus y los alimentos (Diabetes Mellitus and Food) Es importante que controle su nivel de azcar en la sangre (glucosa). El nivel de glucosa en sangre depende en gran medida de lo que usted come. Comer alimentos saludables en las cantidades adecuadas a lo largo del da, aproximadamente a la misma hora todos los das, lo ayudar a controlar su nivel de glucosa en sangre. Tambin puede ayudarlo a retrasar o evitar el empeoramiento de la diabetes mellitus. Comer de manera saludable incluso puede ayudarlo a mejorar el nivel de presin arterial y a alcanzar o mantener un peso saludable.  CMO PUEDEN AFECTARME LOS ALIMENTOS? Carbohidratos Los carbohidratos afectan el nivel de glucosa en sangre ms que cualquier otro tipo de alimento. El nutricionista lo ayudar a determinar cuntos carbohidratos puede consumir en cada comida y ensearle a contarlos. El recuento de carbohidratos es importante para mantener la glucosa en sangre en un nivel saludable, en especial si utiliza insulina o toma determinados medicamentos para la diabetes mellitus. Alcohol El alcohol puede provocar disminuciones sbitas de la glucosa en sangre (hipoglucemia), en especial si utiliza insulina o toma determinados medicamentos para la diabetes mellitus. La hipoglucemia es una afeccin que puede poner en peligro la vida. Los sntomas de la hipoglucemia (somnolencia, mareos y desorientacin) son similares a los sntomas de haber consumido mucho alcohol.  Si el mdico lo autoriza a beber alcohol, hgalo con moderacin y siga estas pautas:  Las mujeres no deben beber ms de un trago por da, y los hombres no deben beber ms de dos tragos por da. Un trago es igual a:  12 onzas (355 ml) de cerveza  5 onzas de vino (150 ml) de vino  1,5onzas (45ml) de bebidas espirituosas  No beba con el estmago vaco.  Mantngase hidratado. Beba agua, gaseosas dietticas o t helado sin azcar.  Las gaseosas comunes, los jugos y  otros refrescos podran contener muchos carbohidratos y se deben contar. QU ALIMENTOS NO SE RECOMIENDAN? Cuando haga las elecciones de alimentos, es importante que recuerde que todos los alimentos son distintos. Algunos tienen menos nutrientes que otros por porcin, aunque podran tener la misma cantidad de caloras o carbohidratos. Es difcil darle al cuerpo lo que necesita cuando consume alimentos con menos nutrientes. Estos son algunos ejemplos de alimentos que debera evitar ya que contienen muchas caloras y carbohidratos, pero pocos nutrientes:  Grasas trans (la mayora de los alimentos procesados incluyen grasas trans en la etiqueta de Informacin nutricional).  Gaseosas comunes.  Jugos.  Caramelos.  Dulces, como tortas, pasteles, rosquillas y galletas.  Comidas fritas. QU ALIMENTOS PUEDO COMER? Consuma alimentos ricos en nutrientes, que nutrirn el cuerpo y lo mantendrn saludable. Los alimentos que debe comer tambin dependern de varios factores, como:  Las caloras que necesita.  Los medicamentos que toma.  Su peso.  El nivel de glucosa en sangre.  El nivel de presin arterial.  El nivel de colesterol. Tambin debe consumir una variedad de alimentos, como:  Protenas, como carne, aves, pescado, tofu, frutos secos y semillas (las protenas de animales magros son mejores).  Frutas.  Verduras.  Productos lcteos, como leche, queso y yogur (descremados son mejores).  Panes, granos, pastas, cereales, arroz y frijoles.  Grasas, como aceite de oliva, margarina sin grasas trans, aceite de canola, aguacate y aceitunas. TODOS LOS QUE PADECEN DIABETES MELLITUS TIENEN EL MISMO PLAN DE COMIDAS? Dado que todas las personas que padecen diabetes mellitus son distintas, no hay un solo plan de comidas que funcione para todos. Es muy   importante que se rena con un nutricionista que lo ayudar a crear un plan de comidas adecuado para usted. Document Released: 12/06/2007  Document Revised: 09/03/2013 [employer] Patient Information 2015 [employer]. This information is not intended to replace advice given to you by your health care provider. Make sure you discuss any questions you have with your health care provider.  

## 2014-09-09 ENCOUNTER — Other Ambulatory Visit: Payer: Self-pay | Admitting: *Deleted

## 2014-09-09 ENCOUNTER — Telehealth: Payer: Self-pay | Admitting: Neurology

## 2014-09-09 MED ORDER — ZOLMITRIPTAN 5 MG NA SOLN: 5.0000 mg | NASAL | Status: DC | PRN

## 2014-09-09 NOTE — Telephone Encounter (Signed)
See below note and advise  

## 2014-09-09 NOTE — Telephone Encounter (Signed)
Pt called stating that she was able to pay for the two out of three scripts Dr. Everlena CooperJaffe Rx her. Predisone and Atenolol are the two she was able to afford. Relpax was the script she could not afford. Pt would like to know if there is a generic or another script similar that she could have but with less cost.  [facility] [address] [place]   Her c/b [phone]

## 2014-09-09 NOTE — Telephone Encounter (Signed)
We have samples of Zomig Nasal Spray that she can pick up and try:  5mg  in one nostril at earliest onset of headache.  May repeat spray once in 2 hours if headache persists or recurs.  She should not exceed two sprays in 24 hours.

## 2014-09-15 ENCOUNTER — Telehealth: Payer: Self-pay | Admitting: Neurology

## 2014-09-15 ENCOUNTER — Telehealth: Payer: Self-pay | Admitting: *Deleted

## 2014-09-15 NOTE — Telephone Encounter (Signed)
See below note and advise  

## 2014-09-15 NOTE — Telephone Encounter (Signed)
Patient was not seen in office today no note will be given

## 2014-09-15 NOTE — Telephone Encounter (Signed)
Pt would like to know if a note can be written stating that she has been in contact with our office. Pt took today off due to her not feeling well and Would like to show some type of documentation to her work. C/B [phone]

## 2014-09-15 NOTE — Telephone Encounter (Signed)
Have her try Imitrex  injection (take at earliest onset of headache and may repeat once in 2 hours if needed).

## 2014-09-15 NOTE — Telephone Encounter (Signed)
Pt called stating that the nasal spray is not working for her and her headaches are getting worse. Please call pt and advice to see what she can do. C/b [phone]

## 2014-09-15 NOTE — Telephone Encounter (Signed)
Have her try Imitrex  injection (take at earliest onset of headache and may repeat once in 2 hours if needed).    2 syringes with 3 refills

## 2014-10-23 ENCOUNTER — Other Ambulatory Visit: Payer: Self-pay | Admitting: *Deleted

## 2014-10-23 ENCOUNTER — Telehealth: Payer: Self-pay | Admitting: Neurology

## 2014-10-23 DIAGNOSIS — G43009 Migraine without aura, not intractable, without status migrainosus: Secondary | ICD-10-CM

## 2014-10-23 MED ORDER — ATENOLOL 50 MG PO TABS: 50.0000 mg | ORAL_TABLET | Freq: Every day | ORAL | Status: DC

## 2014-10-23 NOTE — Telephone Encounter (Signed)
ATENOLOL 50 mg  sent to pharmacy #[id number] with 2 refills

## 2014-10-23 NOTE — Telephone Encounter (Signed)
Pt called requesting a refill for ATENOLOL 50 mg Pharmacy: [facility] [address] [phone]

## 2014-12-02 ENCOUNTER — Ambulatory Visit (INDEPENDENT_AMBULATORY_CARE_PROVIDER_SITE_OTHER): Payer: Self-pay | Admitting: Neurology

## 2014-12-02 ENCOUNTER — Other Ambulatory Visit: Payer: Self-pay | Admitting: *Deleted

## 2014-12-02 ENCOUNTER — Encounter: Payer: Self-pay | Admitting: Neurology

## 2014-12-02 VITALS — BP 112/62 | HR 64 | Resp 20 | Ht 61.0 in | Wt 152.0 lb

## 2014-12-02 DIAGNOSIS — G43009 Migraine without aura, not intractable, without status migrainosus: Secondary | ICD-10-CM

## 2014-12-02 MED ORDER — ATENOLOL 50 MG PO TABS: 50.0000 mg | ORAL_TABLET | Freq: Every day | ORAL | Status: DC

## 2014-12-02 NOTE — Progress Notes (Signed)
NEUROLOGY FOLLOW UP OFFICE NOTE  Catherine Morris [id number]  HISTORY OF PRESENT ILLNESS: Catherine Morris is a 34 year old right-handed Spanish-speaking woman with history of depression, Bipolar disorder and back pain who follows up for chronic migraines without aura.  She is accompanied by an interpretor  UPDATE: Headaches have improved.  However, she is not sure if it is due to the atenolol or having had gotten corrected eye glasses Intensity:  3-4/10 Frequency:  1 to 2 times a week  Current abortive therapy:  None Current preventative therapy:  Topamax  (  caused increased sleepiness), atenolol .   Other current medications:  alprazolam 0.25mg  at bedtime, fluoxetine   Caffeine:  1 cup coffee daily, sometimes soda Alcohol:  No Smoker:  No Diet:  Eats food and junk food Exercise:  No Depression/stress:  Depression Sleep hygiene:  Varies  HISTORY: Onset:  5-7 years ago Location:  Varies. Sometimes right-sided, sometimes left sided, sometimes frontal Quality:  Pounding Initial intensity:  Moderate 6/10, severe 10 out of 10; December 9/10 Prodrome:  Sensation of wave in her head Associated symptoms:  Photophobia, phonophobia, sometimes osmophobia. No nausea. Initial duration:  3-5 hours; December 1 day Initial frequency:  23 headache days per month (8-10 severe headache days per month); December 10 headache days per month. Triggers/exacerbating factors:  No Relieving factors:  No Activity:  Unable to function with severe headaches  Past abortive therapy:  Tylenol ineffective, Excedrin ineffective, Advil ineffective, Aleve ineffective, Maxalt caused dizziness, tramadol (overuse), butalbital combo (overuse), sumatriptan  (made her hallucinate), Diclofenac potassium , Zomig Nasal Spray.  She could not afford Relpax or Imitrex . Past preventative therapy:  None  Family history of headache:  Niece's has headaches  PAST MEDICAL  HISTORY: Past Medical History  Diagnosis Date  . Back pain   . Knee pain   . Depression   . Bipolar disorder     MEDICATIONS: Current Outpatient Prescriptions on File Prior to Visit  Medication Sig Dispense Refill  . FLUoxetine (PROZAC) 40 MG capsule Take 40 mg by mouth 2 (two) times daily.    Marland Kitchen ALPRAZolam (XANAX) 1 MG tablet Take 1 mg by mouth 2 (two) times daily as needed for anxiety.    Marland Kitchen atenolol (TENORMIN) 50 MG tablet Take 1 tablet (50 mg total) by mouth daily. Take 0.5tab daily x7d, then increase to 1tab daily 30 tablet 3  . butalbital-acetaminophen-caffeine (ESGIC) 50-325-40 MG per tablet Take 1 tablet by mouth every 6 (six) hours as needed for headache. (Patient not taking: Reported on 09/01/2014) 30 tablet 0  . diclofenac (CATAFLAM) 50 MG tablet Take 1 tablet (50 mg total) by mouth 3 (three) times daily as needed. (Patient not taking: Reported on 12/02/2014) 30 tablet 0  . diclofenac (CATAFLAM) 50 MG tablet Take 1 tablet (50 mg total) by mouth 3 (three) times daily. (Patient not taking: Reported on 12/02/2014) 90 tablet 1  . eletriptan (RELPAX) 40 MG tablet Take1tab at earliest onset of headache. May repeat x1 in 2 hours if headache persists or recurs. (Patient not taking: Reported on 12/02/2014) 9 tablet 0  . predniSONE (DELTASONE) 10 MG tablet Take 6tabs x1day, then 5tabs x1day, then 4tabs x1day, then 3tabs x1day, then 2tabs x1day, then 1tab x1day, then STOP (Patient not taking: Reported on 12/02/2014) 21 tablet 0  . SUMAtriptan (IMITREX) 100 MG tablet Take 1 tablet (100 mg total) by mouth once as needed for migraine or headache. May repeat x1 in 2hrs if needed.  Do not exceed  2pills in 24h (Patient not taking: Reported on 09/01/2014) 9 tablet 2  . topiramate (TOPAMAX) 50 MG tablet Take 2.5tabs qhs x7d, then 3tabs qhs. (Patient not taking: Reported on 12/02/2014) 90 tablet 0  . traMADol (ULTRAM) 50 MG tablet Take 50 mg by mouth every 6 (six) hours as needed.    . zolmitriptan (ZOMIG) 5  MG nasal solution Place 1 spray into the nose as needed for migraine (at earliest onset of headache . May repeat  spray once in 2 hours if still having headache (. DO NOT EXCEED 2 sprays in 24 hour period ).). (Patient not taking: Reported on 12/02/2014) 6 Units 0   No current facility-administered medications on file prior to visit.    ALLERGIES: No Known Allergies  FAMILY HISTORY: Family History  Problem Relation Age of Onset  . Hypertension Mother   . Diabetes Father   . Diabetes Brother     SOCIAL HISTORY: History   Social History  . Marital Status: Single    Spouse Name: N/A  . Number of Children: N/A  . Years of Education: N/A   Occupational History  . Not on file.   Social History Main Topics  . Smoking status: Never Smoker   . Smokeless tobacco: Not on file  . Alcohol Use: No  . Drug Use: No  . Sexual Activity:    Partners: Male   Other Topics Concern  . Not on file   Social History Narrative    REVIEW OF SYSTEMS: Constitutional: No fevers, chills, or sweats, no generalized fatigue, change in appetite Eyes: No visual changes, double vision, eye pain Ear, nose and throat: No hearing loss, ear pain, nasal congestion, sore throat Cardiovascular: No chest pain, palpitations Respiratory:  No shortness of breath at rest or with exertion, wheezes GastrointestinaI: No nausea, vomiting, diarrhea, abdominal pain, fecal incontinence Genitourinary:  No dysuria, urinary retention or frequency Musculoskeletal:  No neck pain, back pain Integumentary: No rash, pruritus, skin lesions Neurological: as above Psychiatric: No depression, insomnia, anxiety Endocrine: No palpitations, fatigue, diaphoresis, mood swings, change in appetite, change in weight, increased thirst Hematologic/Lymphatic:  No anemia, purpura, petechiae. Allergic/Immunologic: no itchy/runny eyes, nasal congestion, recent allergic reactions, rashes  PHYSICAL EXAM: Filed Vitals:   12/02/14 1134  BP:  112/62  Pulse: 64  Resp: 20   General: No acute distress Head:  Normocephalic/atraumatic Eyes:  Fundoscopic exam unremarkable without vessel changes, exudates, hemorrhages or papilledema. Neck: supple, no paraspinal tenderness, full range of motion Heart:  Regular rate and rhythm Lungs:  Clear to auscultation bilaterally Back: No paraspinal tenderness Neurological Exam: alert and oriented to person, place, and time. Attention span and concentration intact, recent and remote memory intact, fund of knowledge intact.  Speech fluent and not dysarthric, language intact.  CN II-XII intact. Fundoscopic exam unremarkable without vessel changes, exudates, hemorrhages or papilledema.  Bulk and tone normal, muscle strength 5/5 throughout.  Sensation to light touch  intact.  Deep tendon reflexes 2+ throughout.  Finger to nose testing intact.  Gait normal, Romberg negative.  IMPRESSION: Migraine without aura, improved  PLAN: 1.  Will continue topiramate 100mg  and atenolol 50mg  daily for now, especially since the headaches have just recently gotten controlled. 2.  She will follow up in 3 months.  If still stable, will then discuss possibility of discontinuing at least one of the preventative medications  15 minutes spent with patient, over 50% spent discussing plan.  Shon MilletAdam Jaffe, DO  CC:  Doris Cheadleeepak Advani, MD

## 2014-12-02 NOTE — Patient Instructions (Addendum)
1.  Continue topamax 100mg  daily and atenolol 50mg  daily for now. 2.  Follow up in 3 months and we can decide whether to get off of the medications.   1. Continuar Topamax 100 mg al da y 50 mg de atenolol diaria por ahora. 2. Dar seguimiento a los 3 meses y podemos decidir si se debe bajar de los medicamentos.

## 2015-02-04 ENCOUNTER — Ambulatory Visit: Payer: Self-pay | Attending: Internal Medicine

## 2015-02-10 ENCOUNTER — Encounter: Payer: Self-pay | Admitting: Internal Medicine

## 2015-02-10 ENCOUNTER — Ambulatory Visit: Payer: Self-pay | Attending: Internal Medicine | Admitting: Internal Medicine

## 2015-02-10 VITALS — BP 96/66 | HR 76 | Temp 98.0°F | Resp 16 | Wt 161.8 lb

## 2015-02-10 DIAGNOSIS — I952 Hypotension due to drugs: Secondary | ICD-10-CM | POA: Insufficient documentation

## 2015-02-10 DIAGNOSIS — R519 Headache, unspecified: Secondary | ICD-10-CM

## 2015-02-10 DIAGNOSIS — K029 Dental caries, unspecified: Secondary | ICD-10-CM | POA: Insufficient documentation

## 2015-02-10 DIAGNOSIS — R7303 Prediabetes: Secondary | ICD-10-CM

## 2015-02-10 DIAGNOSIS — R7309 Other abnormal glucose: Secondary | ICD-10-CM | POA: Insufficient documentation

## 2015-02-10 DIAGNOSIS — R51 Headache: Secondary | ICD-10-CM | POA: Insufficient documentation

## 2015-02-10 LAB — POCT GLYCOSYLATED HEMOGLOBIN (HGB A1C): Hemoglobin A1C: 6

## 2015-02-10 NOTE — Patient Instructions (Signed)
La diabetes mellitus y los alimentos (Diabetes Mellitus and Food) Es importante que controle su nivel de azcar en la sangre (glucosa). El nivel de glucosa en sangre depende en gran medida de lo que usted come. Comer alimentos saludables en las cantidades adecuadas a lo largo del da, aproximadamente a la misma hora todos los das, lo ayudar a controlar su nivel de glucosa en sangre. Tambin puede ayudarlo a retrasar o evitar el empeoramiento de la diabetes mellitus. Comer de manera saludable incluso puede ayudarlo a mejorar el nivel de presin arterial y a alcanzar o mantener un peso saludable.  CMO PUEDEN AFECTARME LOS ALIMENTOS? Carbohidratos Los carbohidratos afectan el nivel de glucosa en sangre ms que cualquier otro tipo de alimento. El nutricionista lo ayudar a determinar cuntos carbohidratos puede consumir en cada comida y ensearle a contarlos. El recuento de carbohidratos es importante para mantener la glucosa en sangre en un nivel saludable, en especial si utiliza insulina o toma determinados medicamentos para la diabetes mellitus. Alcohol El alcohol puede provocar disminuciones sbitas de la glucosa en sangre (hipoglucemia), en especial si utiliza insulina o toma determinados medicamentos para la diabetes mellitus. La hipoglucemia es una afeccin que puede poner en peligro la vida. Los sntomas de la hipoglucemia (somnolencia, mareos y desorientacin) son similares a los sntomas de haber consumido mucho alcohol.  Si el mdico lo autoriza a beber alcohol, hgalo con moderacin y siga estas pautas:  Las mujeres no deben beber ms de un trago por da, y los hombres no deben beber ms de dos tragos por da. Un trago es igual a:  12 onzas (355 ml) de cerveza  5 onzas de vino (150 ml) de vino  1,5onzas (45ml) de bebidas espirituosas  No beba con el estmago vaco.  Mantngase hidratado. Beba agua, gaseosas dietticas o t helado sin azcar.  Las gaseosas comunes, los jugos y  otros refrescos podran contener muchos carbohidratos y se deben contar. QU ALIMENTOS NO SE RECOMIENDAN? Cuando haga las elecciones de alimentos, es importante que recuerde que todos los alimentos son distintos. Algunos tienen menos nutrientes que otros por porcin, aunque podran tener la misma cantidad de caloras o carbohidratos. Es difcil darle al cuerpo lo que necesita cuando consume alimentos con menos nutrientes. Estos son algunos ejemplos de alimentos que debera evitar ya que contienen muchas caloras y carbohidratos, pero pocos nutrientes:  Grasas trans (la mayora de los alimentos procesados incluyen grasas trans en la etiqueta de Informacin nutricional).  Gaseosas comunes.  Jugos.  Caramelos.  Dulces, como tortas, pasteles, rosquillas y galletas.  Comidas fritas. QU ALIMENTOS PUEDO COMER? Consuma alimentos ricos en nutrientes, que nutrirn el cuerpo y lo mantendrn saludable. Los alimentos que debe comer tambin dependern de varios factores, como:  Las caloras que necesita.  Los medicamentos que toma.  Su peso.  El nivel de glucosa en sangre.  El nivel de presin arterial.  El nivel de colesterol. Tambin debe consumir una variedad de alimentos, como:  Protenas, como carne, aves, pescado, tofu, frutos secos y semillas (las protenas de animales magros son mejores).  Frutas.  Verduras.  Productos lcteos, como leche, queso y yogur (descremados son mejores).  Panes, granos, pastas, cereales, arroz y frijoles.  Grasas, como aceite de oliva, margarina sin grasas trans, aceite de canola, aguacate y aceitunas. TODOS LOS QUE PADECEN DIABETES MELLITUS TIENEN EL MISMO PLAN DE COMIDAS? Dado que todas las personas que padecen diabetes mellitus son distintas, no hay un solo plan de comidas que funcione para todos. Es muy   importante que se rena con un nutricionista que lo ayudar a crear un plan de comidas adecuado para usted. Document Released: 12/06/2007  Document Revised: 09/03/2013 [employer] Patient Information 2015 [employer]. This information is not intended to replace advice given to you by your health care provider. Make sure you discuss any questions you have with your health care provider.  

## 2015-02-10 NOTE — Progress Notes (Signed)
Patient here with in house interpreter Patient states she is here for a check up and to check on her Pre diabetes

## 2015-02-10 NOTE — Progress Notes (Signed)
MRN: [id number] Name: Catherine Morris  Sex: female Age: 34 y.o. DOB: February 11, 1981  Allergies: Review of patient's allergies indicates no known allergies.  Chief Complaint  Patient presents with  . Follow-up    HPI: Patient is 34 y.o. female who history of chronic headaches, prediabetes comes today for followup as per patient she is trying to modify her diet, her hemoglobin A1c is 6.0%, she follows up with the neurologist and is on Topamax and her atenolol for prophylaxis of headaches, today her blood pressure is borderline low, she has this headache but unchanged denies any dizziness, denies any chest pain shortness of breath. Patient also requests referral to see a dentist.  Past Medical History  Diagnosis Date  . Back pain   . Knee pain   . Depression   . Bipolar disorder     History reviewed. No pertinent past surgical history.    Medication List       This list is accurate as of: 02/10/15  4:45 PM.  Always use your most recent med list.               ALPRAZolam 1 MG tablet  Commonly known as:  XANAX  Take 1 mg by mouth 2 (two) times daily as needed for anxiety.     atenolol 50 MG tablet  Commonly known as:  TENORMIN  Take 1 tablet (50 mg total) by mouth daily. Take 0.5tab daily x7d, then increase to 1tab daily     atenolol 50 MG tablet  Commonly known as:  TENORMIN  Take 1 tablet (50 mg total) by mouth daily.     butalbital-acetaminophen-caffeine 50-325-40 MG per tablet  Commonly known as:  ESGIC  Take 1 tablet by mouth every 6 (six) hours as needed for headache.     diclofenac 50 MG tablet  Commonly known as:  CATAFLAM  Take 1 tablet (50 mg total) by mouth 3 (three) times daily as needed.     diclofenac 50 MG tablet  Commonly known as:  CATAFLAM  Take 1 tablet (50 mg total) by mouth 3 (three) times daily.     eletriptan 40 MG tablet  Commonly known as:  RELPAX  Take1tab at earliest onset of headache. May repeat x1 in 2 hours if headache  persists or recurs.     FLUoxetine 40 MG capsule  Commonly known as:  PROZAC  Take 40 mg by mouth 2 (two) times daily.     predniSONE 10 MG tablet  Commonly known as:  DELTASONE  Take 6tabs x1day, then 5tabs x1day, then 4tabs x1day, then 3tabs x1day, then 2tabs x1day, then 1tab x1day, then STOP     SUMAtriptan 100 MG tablet  Commonly known as:  IMITREX  Take 1 tablet (100 mg total) by mouth once as needed for migraine or headache. May repeat x1 in 2hrs if needed.  Do not exceed 2pills in 24h     topiramate 100 MG tablet  Commonly known as:  TOPAMAX  Take 100 mg by mouth daily.     topiramate 50 MG tablet  Commonly known as:  TOPAMAX  Take 2.5tabs qhs x7d, then 3tabs qhs.     traMADol 50 MG tablet  Commonly known as:  ULTRAM  Take 50 mg by mouth every 6 (six) hours as needed.     Vitamin D (Ergocalciferol) 50000 UNITS Caps capsule  Commonly known as:  DRISDOL  Take 50,000 Units by mouth every 7 (seven) days.     zolmitriptan 5  MG nasal solution  Commonly known as:  ZOMIG  Place 1 spray into the nose as needed for migraine (at earliest onset of headache . May repeat  spray once in 2 hours if still having headache (. DO NOT EXCEED 2 sprays in 24 hour period ).).        No orders of the defined types were placed in this encounter.    Immunization History  Administered Date(s) Administered  . Influenza,inj,Quad PF,36+ Mos 09/08/2014    Family History  Problem Relation Age of Onset  . Hypertension Mother   . Diabetes Father   . Diabetes Brother     History  Substance Use Topics  . Smoking status: Never Smoker   . Smokeless tobacco: Not on file  . Alcohol Use: No    Review of Systems   As noted in HPI  Filed Vitals:   02/10/15 1600  BP: 96/66  Pulse: 76  Temp:   Resp:     Physical Exam  Physical Exam  Constitutional: She is oriented to person, place, and time.  HENT:  Dental cavities  Eyes: EOM are normal. Pupils are equal, round, and reactive to  light.  Cardiovascular: Normal rate and regular rhythm.   Pulmonary/Chest: Breath sounds normal. No respiratory distress. She has no wheezes. She has no rales.  Neurological: She is alert and oriented to person, place, and time. She has normal reflexes. No cranial nerve deficit. Coordination normal.    CBC    Component Value Date/Time   WBC 7.3 02/20/2014 1227   RBC 4.31 02/20/2014 1227   HGB 13.1 02/20/2014 1227   HCT 37.8 02/20/2014 1227   PLT 345 02/20/2014 1227   MCV 87.7 02/20/2014 1227   LYMPHSABS 2.4 02/20/2014 1227   MONOABS 0.5 02/20/2014 1227   EOSABS 0.1 02/20/2014 1227   BASOSABS 0.0 02/20/2014 1227    CMP     Component Value Date/Time   NA 137 02/20/2014 1227   K 4.4 02/20/2014 1227   CL 106 02/20/2014 1227   CO2 20 02/20/2014 1227   GLUCOSE 87 02/20/2014 1227   BUN 12 02/20/2014 1227   CREATININE 0.78 02/20/2014 1227   CREATININE 0.54 12/09/2009 2138   CALCIUM 9.8 02/20/2014 1227   PROT 7.3 02/20/2014 1227   ALBUMIN 4.4 02/20/2014 1227   AST 18 02/20/2014 1227   ALT 23 02/20/2014 1227   ALKPHOS 109 02/20/2014 1227   BILITOT 0.2 02/20/2014 1227   GFRNONAA >89 02/20/2014 1227   GFRNONAA >60 07/13/2009 0248   GFRAA >89 02/20/2014 1227   GFRAA  07/13/2009 0248    >60        The eGFR has been calculated using the MDRD equation. This calculation has not been validated in all clinical situations. eGFR's persistently <60 mL/min signify possible Chronic Kidney Disease.    Lab Results  Component Value Date/Time   CHOL 216* 12/09/2009 09:38 PM    Lab Results  Component Value Date/Time   HGBA1C 6.0 02/10/2015 04:00 PM   HGBA1C 5.9* 02/20/2014 12:27 PM    Lab Results  Component Value Date/Time   AST 18 02/20/2014 12:27 PM    Assessment and Plan  Pre-diabetes - Plan:  Results for orders placed or performed in visit on 02/10/15  HgB A1c  Result Value Ref Range   Hemoglobin A1C 6.0    HgB A1c is unchanged compared to last one ,advised  patient for low carbohydrate diet.  Dental cavities Referred to dentistry.  Hypotension due to  drugs Patient is on atenolol, she's not symptomatic with low blood pressure, still have advised to reduce the dose of atenolol to 25 mg daily to prevent hypotension, she'll come back in 2 weeks for nurse visit BP check, advise patient to get immediate medical attention if she has any new symptoms, she understands verbalized instructions.  Chronic nonintractable headache, unspecified headache type Continue with her current meds already has scheduled with neurology next month.   Return in about 3 months (around 05/13/2015) for BP check in 2 weeks/Nurse Visit.   This note has been created with Surveyor, quantity. Any transcriptional errors are unintentional.    Lorayne Marek, MD

## 2015-03-06 ENCOUNTER — Ambulatory Visit: Payer: Self-pay | Attending: Internal Medicine | Admitting: *Deleted

## 2015-03-06 VITALS — BP 88/54 | HR 73 | Temp 98.5°F | Resp 18 | Ht 61.0 in | Wt 160.6 lb

## 2015-03-06 DIAGNOSIS — I952 Hypotension due to drugs: Secondary | ICD-10-CM | POA: Insufficient documentation

## 2015-03-06 NOTE — Progress Notes (Signed)
Spoke with patient via Astronomer, Catherine Morris Patient presents for BP check for hypotension after decreasing tenormin to 25 mg daily for 2 weeks. Patient states she also decreased topamax to 50 mg daily for last 7 days Patient denies dizziness, lightheadedness, feeling like passing out Patient has appt with neurologist in 4 days  Filed Vitals:   03/06/15 1627  BP: 88/54  Pulse: 73  Temp: 98.5 F (36.9 C)  Resp: 18    Per PA: No changes to meds at this time F/u with neurologist

## 2015-03-06 NOTE — Progress Notes (Signed)
Patient here for blood pressure check.  Overall, patient states she feels "good."  Patient has no complaints of pain at this time.  Manual BP 88/54.  Reviewed medications with patient.  Patient reports taking 25mg  of Atenolol for the past 2 weeks, and 50mg  Topamax for the past week for migraines.  Per Scot Jun, PA, patient instructed to continue taking medications as she currently is because she is scheduled to see Neurologist on Tuesday (03/10/15).

## 2015-03-10 ENCOUNTER — Telehealth: Payer: Self-pay | Admitting: Internal Medicine

## 2015-03-10 ENCOUNTER — Ambulatory Visit: Payer: Self-pay | Admitting: Neurology

## 2015-03-10 NOTE — Telephone Encounter (Signed)
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 Patient says that she was referred to the dentist about a month ago but never received a call for an appointment. Please follow up with pt. Patient has the orange card and says was referred to [facility] but they dont have a referral from us. Please follow up with pt.

## 2015-03-10 NOTE — Telephone Encounter (Signed)
Referral sent today to [facility] . They will contact the patient to schedule an appointment or patient can follow up  [phone]

## 2015-03-12 ENCOUNTER — Ambulatory Visit: Payer: Self-pay | Admitting: Neurology

## 2015-05-12 ENCOUNTER — Encounter: Payer: Self-pay | Admitting: Neurology

## 2015-05-12 ENCOUNTER — Ambulatory Visit (INDEPENDENT_AMBULATORY_CARE_PROVIDER_SITE_OTHER): Payer: Self-pay | Admitting: Neurology

## 2015-05-12 VITALS — BP 112/66 | HR 64 | Resp 18 | Ht 61.0 in | Wt 166.0 lb

## 2015-05-12 DIAGNOSIS — G43719 Chronic migraine without aura, intractable, without status migrainosus: Secondary | ICD-10-CM

## 2015-05-12 MED ORDER — SUMATRIPTAN 20 MG/ACT NA SOLN: NASAL | Status: DC

## 2015-05-12 MED ORDER — AMITRIPTYLINE HCL 25 MG PO TABS: 25.0000 mg | ORAL_TABLET | Freq: Every day | ORAL | Status: DC

## 2015-05-12 NOTE — Progress Notes (Signed)
NEUROLOGY FOLLOW UP OFFICE NOTE  Catherine Morris [id number]  HISTORY OF PRESENT ILLNESS: Catherine Morris is a 34 year old right-handed Spanish-speaking woman with history of depression who follows up for chronic migraines without aura.  She is accompanied by an interpretor.  UPDATE: She was doing well until June after her atenolol was cut from  to  due to low blood pressure. Intensity:  7-8/10 Duration:  hours Frequency:  20 headache days per month  Current abortive therapy:  none. Current preventative therapy:  Topamax  (higher doses caused increased sleepiness), atenolol .   Other current medications:  alprazolam 0.25mg  at bedtime, fluoxetine   Caffeine:  1 cup coffee daily, sometimes soda Alcohol:  No Smoker:  No Diet:  Eats food and junk food Exercise:  No Depression/stress:  Depression Sleep hygiene:  Varies  HISTORY: Onset:  5-7 years ago Location:  Varies. Sometimes right-sided, sometimes left sided, sometimes frontal Quality:  Pounding Initial intensity:  Moderate 6/10, severe 10 out of 10; March 3-4/10 Prodrome:  Sensation of wave in her head Associated symptoms:  Photophobia, phonophobia, sometimes osmophobia. No nausea. Initial duration:  3-5 hours Initial frequency:  23 headache days per month (8-10 severe headache days per month); March 1 to 2 times a week Triggers/exacerbating factors:  No Relieving factors:  No Activity:  Unable to function with severe headaches  Past abortive therapy:  Tylenol ineffective, Excedrin ineffective, Advil ineffective, Aleve ineffective, Maxalt caused dizziness, tramadol (overuse), butalbital combo (overuse), sumatriptan  (made her hallucinate), Diclofenac potassium , Zomig Nasal Spray.  She could not afford Relpax or Imitrex [place]. Past preventative therapy:  None  Family history of headache:  Niece's has headaches  PAST MEDICAL HISTORY: Past Medical History  Diagnosis Date    . Back pain   . Knee pain   . Depression   . Bipolar disorder     MEDICATIONS: Current Outpatient Prescriptions on File Prior to Visit  Medication Sig Dispense Refill  . ALPRAZolam (XANAX) 1 MG tablet Take 1 mg by mouth 2 (two) times daily as needed for anxiety.    . diclofenac (CATAFLAM) 50 MG tablet Take 1 tablet (50 mg total) by mouth 3 (three) times daily as needed. 30 tablet 0  . diclofenac (CATAFLAM) 50 MG tablet Take 1 tablet (50 mg total) by mouth 3 (three) times daily. 90 tablet 1  . predniSONE (DELTASONE) 10 MG tablet Take 6tabs x1day, then 5tabs x1day, then 4tabs x1day, then 3tabs x1day, then 2tabs x1day, then 1tab x1day, then STOP 21 tablet 0  . Vitamin D, Ergocalciferol, (DRISDOL) 50000 UNITS CAPS capsule Take 50,000 Units by mouth every 7 (seven) days.    Marland Kitchen eletriptan (RELPAX) 40 MG tablet Take1tab at earliest onset of headache. May repeat x1 in 2 hours if headache persists or recurs. (Patient not taking: Reported on 05/12/2015) 9 tablet 0   No current facility-administered medications on file prior to visit.    ALLERGIES: No Known Allergies  FAMILY HISTORY: Family History  Problem Relation Age of Onset  . Hypertension Mother   . Diabetes Father   . Diabetes Brother     SOCIAL HISTORY: Social History   Social History  . Marital Status: Single    Spouse Name: N/A  . Number of Children: N/A  . Years of Education: N/A   Occupational History  . Not on file.   Social History Main Topics  . Smoking status: Never Smoker   . Smokeless tobacco: Not on file  . Alcohol Use: No  .  Drug Use: No  . Sexual Activity:    Partners: Male   Other Topics Concern  . Not on file   Social History Narrative    REVIEW OF SYSTEMS: Constitutional: No fevers, chills, or sweats, no generalized fatigue, change in appetite Eyes: No visual changes, double vision, eye pain Ear, nose and throat: No hearing loss, ear pain, nasal congestion, sore throat Cardiovascular: No chest  pain, palpitations Respiratory:  No shortness of breath at rest or with exertion, wheezes GastrointestinaI: No nausea, vomiting, diarrhea, abdominal pain, fecal incontinence Genitourinary:  No dysuria, urinary retention or frequency Musculoskeletal:  No neck pain, back pain Integumentary: No rash, pruritus, skin lesions Neurological: as above Psychiatric: No depression, insomnia, anxiety Endocrine: No palpitations, fatigue, diaphoresis, mood swings, change in appetite, change in weight, increased thirst Hematologic/Lymphatic:  No anemia, purpura, petechiae. Allergic/Immunologic: no itchy/runny eyes, nasal congestion, recent allergic reactions, rashes  PHYSICAL EXAM: Filed Vitals:   05/12/15 0916  BP: 112/66  Pulse: 64  Resp: 18   General: No acute distress.  Patient appears well-groomed.   Head:  Normocephalic/atraumatic Eyes:  Fundoscopic exam unremarkable without vessel changes, exudates, hemorrhages or papilledema. Neck: supple, no paraspinal tenderness, full range of motion Heart:  Regular rate and rhythm Lungs:  Clear to auscultation bilaterally Back: No paraspinal tenderness Neurological Exam: alert and oriented to person, place, and time. Attention span and concentration intact, recent and remote memory intact, fund of knowledge intact.  Speech fluent and not dysarthric, language intact.  CN II-XII intact. Fundoscopic exam unremarkable without vessel changes, exudates, hemorrhages or papilledema.  Bulk and tone normal, muscle strength 5/5 throughout.  Sensation to light touch intact.  Deep tendon reflexes 2+ throughout.  Finger to nose and heel to shin testing intact.  Gait normal  IMPRESSION: Chronic migraine without aura  PLAN: 1.  Will transition fluoxetine to amitriptyline 25mg  at bedtime, as amitriptyline may be effective for migraine prevention 2.  We will have her discontinue atenolol and topiramate (on low doses so not need to taper) 3.  For abortive therapy, I will  prescribe her sumatriptan 20mg  NS.  Hopefully, this will be affordable for her. 4.  Follow up in 3 months.  She is to call in 4 weeks with update.  15 minutes spent face to face with patient, over 50% spent discussing management.  Shon Millet, DO  CC:  Doris Cheadle, MD

## 2015-05-12 NOTE — Patient Instructions (Addendum)
1.  We will start amitriptyline  at bedtime.  Call in 4 weeks with update and we can increase dose. 2.  Stop atenolol and Prozac, and topiramate. 3. When you get a migraine, take 1 spray of sumatriptan  into nostril.  May repeat 1 spray once in 2 hours if needed.  Do not exceed 2 sprays in 24 hours.  4.  Follow up in 3 months.  1. Vamos a empezar a 25 mg de amitriptilina a la hora de [employer] Llamamos en 4 semanas con la actualizacin y podemos aumentar la dosis. 2. Detener el atenolol y el Prozac, y [place]. 3. Cuando se obtiene una migraa, tome 1 de pulverizacin de 20 mg de sumatriptn en el orificio nasal. Se puede repetir 1 aerosol una vez en 2 horas si es necesario. No exceda de 2 pulverizaciones en 24 horas. 4. Seguimiento en 3 meses.

## 2015-07-24 ENCOUNTER — Telehealth: Payer: Self-pay | Admitting: Internal Medicine

## 2015-07-24 ENCOUNTER — Other Ambulatory Visit: Payer: Self-pay

## 2015-07-24 ENCOUNTER — Encounter: Payer: Self-pay | Admitting: Internal Medicine

## 2015-07-24 ENCOUNTER — Ambulatory Visit (HOSPITAL_COMMUNITY)
Admission: RE | Admit: 2015-07-24 | Discharge: 2015-07-24 | Disposition: A | Discharge status: Home / Self Care | Payer: Self-pay | Source: Ambulatory Visit | Attending: Internal Medicine | Admitting: Internal Medicine

## 2015-07-24 ENCOUNTER — Ambulatory Visit: Payer: Self-pay | Attending: Family Medicine | Admitting: Internal Medicine

## 2015-07-24 VITALS — BP 131/83 | HR 100 | Temp 97.8°F | Resp 17 | Ht 61.0 in | Wt 170.0 lb

## 2015-07-24 DIAGNOSIS — R079 Chest pain, unspecified: Secondary | ICD-10-CM | POA: Insufficient documentation

## 2015-07-24 DIAGNOSIS — R7309 Other abnormal glucose: Secondary | ICD-10-CM | POA: Insufficient documentation

## 2015-07-24 DIAGNOSIS — R7303 Prediabetes: Secondary | ICD-10-CM

## 2015-07-24 LAB — POCT GLYCOSYLATED HEMOGLOBIN (HGB A1C): HEMOGLOBIN A1C: 6.3

## 2015-07-24 LAB — D-DIMER, QUANTITATIVE (NOT AT ARMC): D DIMER QUANT: 0.27 ug{FEU}/mL (ref 0.00–0.48)

## 2015-07-24 MED ORDER — NAPROXEN 500 MG PO TABS
500.0000 mg | ORAL_TABLET | Freq: Two times a day (BID) | ORAL | Status: DC
Start: 2015-07-24 — End: 2020-06-19

## 2015-07-24 NOTE — Progress Notes (Signed)
   Subjective:    Patient ID: Catherine Morris, female    DOB: 1981/04/14, 34 y.o.   MRN: [id number]  Chest Pain  This is a new problem. The current episode started in the past 7 days. The problem occurs constantly. The pain is present in the substernal region. The pain is moderate. The quality of the pain is described as heavy (pinching). The pain radiates to the left shoulder. Associated symptoms include back pain (with deep inspiration). Pertinent negatives include no abdominal pain, diaphoresis, dizziness, irregular heartbeat, nausea, palpitations or shortness of breath. She has tried nothing for the symptoms. Risk factors include obesity, sedentary lifestyle and stress.  Pertinent negatives for past medical history include no thyroid problem.  Lmp: 10/14, not a smoker    Review of Systems  Constitutional: Negative for diaphoresis.  Respiratory: Negative for shortness of breath.   Cardiovascular: Positive for chest pain. Negative for palpitations.  Gastrointestinal: Negative for nausea and abdominal pain.  Musculoskeletal: Positive for back pain (with deep inspiration).  Neurological: Negative for dizziness.       Objective:   Physical Exam  Cardiovascular: Normal rate, regular rhythm and normal heart sounds.   Pulmonary/Chest: Effort normal and breath sounds normal. No respiratory distress. She has no wheezes. She exhibits tenderness (mild tenderness to palpitation).  Abdominal: Soft. Bowel sounds are normal. She exhibits no distension. There is no tenderness.  Musculoskeletal: She exhibits no edema.  Skin: Skin is warm and dry. She is not diaphoretic.      Assessment & Plan:  Catherine Morris was seen today for chest pain.  Diagnoses and all orders for this visit:  Chest pain, unspecified chest pain type -     EKG 12-Lead -     D-dimer, quantitative (not at [facility]) -     naproxen (NAPROSYN) 500 MG tablet; Take 1 tablet (500 mg total) by mouth 2 (two) times daily with a  meal. EKG: normal EKG, normal sinus rhythm. At this point I believe patients pain is musculoskeletal and I will treat as such. Explained signs and symptoms that should warrant immediate attention.  Patient verbalized understanding with teach back used.  Pre-diabetes -     HgB A1c Diet, weight, exercise addressed to slow onset of diabetes.   Return if symptoms worsen or fail to improve.    Ambrose FinlandValerie A Fergus Throne, NP 08/04/2015 12:50 PM

## 2015-07-24 NOTE — Patient Instructions (Signed)
If you develop shortness of breath, sweating, calf pain, nausea please go to the nearest emergency room for further evaluation of your chest pain.

## 2015-07-24 NOTE — Progress Notes (Signed)
Patient complains of having chest pain for the past two days Today would be day three with having these pains Patient rates her pain #5 on pain scale

## 2015-07-27 ENCOUNTER — Ambulatory Visit: Payer: Self-pay | Admitting: Family Medicine

## 2015-07-28 ENCOUNTER — Telehealth: Payer: Self-pay

## 2015-07-28 NOTE — Telephone Encounter (Signed)
In house interpreter used Patient not available Message left on voice mail to return our call 

## 2015-07-28 NOTE — Telephone Encounter (Signed)
-----   Message from Ambrose FinlandValerie A Keck, NP sent at 07/27/2015  9:42 AM EST ----- D-dimer is negative. I do not suspect a blood clot in lungs

## 2015-07-28 NOTE — Telephone Encounter (Signed)
Patient returned phone call, please f/u °

## 2015-07-29 NOTE — Telephone Encounter (Signed)
Patient returned phone call, please f/u with pt.    °

## 2015-07-30 NOTE — Telephone Encounter (Signed)
Patient is saying that she is continuing to have chest pains. She is having trouble breathing, especially when she is active. She is also interested in obtaining her results. Please follow up with pt. Thank you.

## 2015-08-10 ENCOUNTER — Ambulatory Visit: Payer: Self-pay | Attending: Internal Medicine

## 2015-08-10 NOTE — Telephone Encounter (Signed)
error 

## 2015-08-11 ENCOUNTER — Telehealth: Payer: Self-pay

## 2015-08-11 NOTE — Telephone Encounter (Signed)
Interpreter line used GholsonJuliana ID# [id number] Returned patient phone call  Patient not available Message left on voice mail to return  Our call

## 2015-08-12 ENCOUNTER — Ambulatory Visit: Payer: Self-pay | Admitting: Neurology

## 2015-10-08 ENCOUNTER — Other Ambulatory Visit: Payer: Self-pay | Admitting: Neurology

## 2015-10-08 NOTE — Telephone Encounter (Signed)
Last OV: 05/12/15 Next OV: 12/04/15  amitriptyline  at bedtime, as amitriptyline may be effective for migraine prevention

## 2015-12-04 ENCOUNTER — Ambulatory Visit: Payer: Self-pay | Admitting: Neurology

## 2016-08-19 ENCOUNTER — Encounter (HOSPITAL_COMMUNITY): Payer: Self-pay | Admitting: *Deleted

## 2016-08-19 ENCOUNTER — Ambulatory Visit (INDEPENDENT_AMBULATORY_CARE_PROVIDER_SITE_OTHER): Payer: Self-pay

## 2016-08-19 ENCOUNTER — Ambulatory Visit (HOSPITAL_COMMUNITY)
Admission: EM | Admit: 2016-08-19 | Discharge: 2016-08-19 | Disposition: A | Discharge status: Home / Self Care | Payer: Self-pay | Attending: Family Medicine | Admitting: Family Medicine

## 2016-08-19 DIAGNOSIS — S39012A Strain of muscle, fascia and tendon of lower back, initial encounter: Secondary | ICD-10-CM

## 2016-08-19 DIAGNOSIS — R739 Hyperglycemia, unspecified: Secondary | ICD-10-CM

## 2016-08-19 LAB — POCT I-STAT, CHEM 8
BUN: 11 mg/dL (ref 6–20)
CALCIUM ION: 1.22 mmol/L (ref 1.15–1.40)
CREATININE: 0.6 mg/dL (ref 0.44–1.00)
Chloride: 98 mmol/L — ABNORMAL LOW (ref 101–111)
Glucose, Bld: 354 mg/dL — ABNORMAL HIGH (ref 65–99)
HCT: 43 % (ref 36.0–46.0)
Hemoglobin: 14.6 g/dL (ref 12.0–15.0)
Potassium: 4.5 mmol/L (ref 3.5–5.1)
Sodium: 136 mmol/L (ref 135–145)
TCO2: 27 mmol/L (ref 0–100)

## 2016-08-19 LAB — POCT URINALYSIS DIP (DEVICE)
Bilirubin Urine: NEGATIVE
Glucose, UA: 500 mg/dL — AB
Ketones, ur: NEGATIVE mg/dL
LEUKOCYTES UA: NEGATIVE
NITRITE: NEGATIVE
PROTEIN: NEGATIVE mg/dL
Specific Gravity, Urine: 1.01 (ref 1.005–1.030)
UROBILINOGEN UA: 0.2 mg/dL (ref 0.0–1.0)
pH: 6 (ref 5.0–8.0)

## 2016-08-19 MED ORDER — METAXALONE 800 MG PO TABS: 800.0000 mg | ORAL_TABLET | Freq: Three times a day (TID) | ORAL | 0 refills | Status: DC

## 2016-08-19 NOTE — ED Triage Notes (Signed)
Pt  Reports  Back  Pain  X  1   Week  denies  Any  specefic  Injury      Denies any  Urinary  Symptoms  Pain is  Worse  On   Certain posistions  And  Movement

## 2016-08-19 NOTE — ED Provider Notes (Signed)
[facility]    CSN: [id number] Arrival date & time: 08/19/16  1201     History   Chief Complaint Chief Complaint  Patient presents with  . Back Pain    HPI Catherine Morris is a 35 y.o. female.   The history is provided by the patient.  Back Pain  Location:  Lumbar spine Radiates to:  Does not radiate Pain severity:  Mild Onset quality:  Gradual Duration:  1 week Progression:  Unchanged Chronicity:  New Context: lifting heavy objects   Context comment:  Works in housekeeping at hotel. Worsened by:  Bending Associated symptoms: no abdominal pain, no abdominal swelling, no bladder incontinence, no bowel incontinence, no chest pain, no dysuria, no fever, no leg pain, no numbness, no paresthesias, no pelvic pain and no perianal numbness   Associated symptoms comment:  Nl menses. Risk factors: lack of exercise     Past Medical History:  Diagnosis Date  . Back pain   . Bipolar disorder (HCC)   . Depression   . Knee pain     Patient Active Problem List   Diagnosis Date Noted  . Unspecified vitamin D deficiency 05/23/2014  . Prediabetes 05/23/2014  . Intractable chronic migraine without aura and without status migrainosus 03/31/2014  . Anxiety and depression 02/20/2014  . Chronic headache 02/20/2014  . Bilateral knee pain 02/20/2014    History reviewed. No pertinent surgical history.  OB History    No data available       Home Medications    Prior to Admission medications   Medication Sig Start Date End Date Taking? Authorizing Provider  FLUoxetine (PROZAC) 10 MG tablet Take 10 mg by mouth daily.   Yes Historical Provider, MD  ALPRAZolam Prudy Feeler(XANAX) 1 MG tablet Take 1 mg by mouth 2 (two) times daily as needed for anxiety.    Historical Provider, MD  amitriptyline (ELAVIL) 25 MG tablet TAKE ONE TABLET BY MOUTH AT BEDTIME 10/08/15   Drema DallasAdam R Jaffe, DO  naproxen (NAPROSYN) 500 MG tablet Take 1 tablet (500 mg total) by mouth 2 (two) times daily  with a meal. 07/24/15   Ambrose FinlandValerie A Keck, NP  SUMAtriptan (IMITREX) 20 MG/ACT nasal spray Place 1 spray in one nostril.  May repeat x1 in 2 hours if headache persists or recurs. 05/12/15   Drema DallasAdam R Jaffe, DO    Family History Family History  Problem Relation Age of Onset  . Hypertension Mother   . Diabetes Father   . Diabetes Brother     Social History Social History  Substance Use Topics  . Smoking status: Never Smoker  . Smokeless tobacco: Not on file  . Alcohol use No     Allergies   Patient has no known allergies.   Review of Systems Review of Systems  Constitutional: Negative.  Negative for fever.  Cardiovascular: Negative for chest pain.  Gastrointestinal: Negative.  Negative for abdominal pain and bowel incontinence.  Genitourinary: Negative.  Negative for bladder incontinence, dysuria and pelvic pain.  Musculoskeletal: Positive for back pain and myalgias. Negative for gait problem and joint swelling.  Neurological: Negative for numbness and paresthesias.  All other systems reviewed and are negative.    Physical Exam Triage Vital Signs ED Triage Vitals  Enc Vitals Group     BP 08/19/16 1242 124/72     Pulse Rate 08/19/16 1242 78     Resp 08/19/16 1242 16     Temp 08/19/16 1242 98.6 F (37 C)  Temp src --      SpO2 08/19/16 1242 100 %     Weight --      Height --      Head Circumference --      Peak Flow --      Pain Score 08/19/16 1241 9     Pain Loc --      Pain Edu? --      Excl. in GC? --    No data found.   Updated Vital Signs BP 124/72 (BP Location: Right Arm)   Pulse 78   Temp 98.6 F (37 C)   Resp 16   SpO2 100%   Visual Acuity Right Eye Distance:   Left Eye Distance:   Bilateral Distance:    Right Eye Near:   Left Eye Near:    Bilateral Near:     Physical Exam  Constitutional: She appears well-developed and well-nourished.  Abdominal: Soft. Bowel sounds are normal.  Musculoskeletal: She exhibits tenderness.       Lumbar  back: She exhibits decreased range of motion, tenderness, pain and spasm. She exhibits no bony tenderness, no swelling, no deformity and normal pulse.       Back:  Nursing note and vitals reviewed.    UC Treatments / Results  Labs (all labs ordered are listed, but only abnormal results are displayed) Labs Reviewed - No data to display I-stat bs 354  EKG  EKG Interpretation None       Radiology No results found.  Procedures Procedures (including critical care time)  Medications Ordered in UC Medications - No data to display   Initial Impression / Assessment and Plan / UC Course  I have reviewed the triage vital signs and the nursing notes.  Pertinent labs & imaging results that were available during my care of the patient were reviewed by me and considered in my medical decision making (see chart for details).  Clinical Course    Sent for care of bs 354. Prev unknown,   Final Clinical Impressions(s) / UC Diagnoses   Final diagnoses:  None    New Prescriptions New Prescriptions   No medications on file     Linna HoffJames D Jaykub Mackins, MD 08/19/16 1401

## 2016-08-19 NOTE — ED Notes (Signed)
PT refuses to go to ED for eval. PT given instructions to report to ED if she has any concerning symptoms. PT reports she understands reasoning for being referred to ED and she will see her PCP next week for A1C check. PT given medication instructions. PT instructed to not drive or operate machinery while taking skelaxin. PT acknowledges instructions.

## 2019-12-14 ENCOUNTER — Ambulatory Visit: Payer: Self-pay | Attending: Internal Medicine

## 2019-12-14 DIAGNOSIS — Z23 Encounter for immunization: Secondary | ICD-10-CM

## 2019-12-14 NOTE — Progress Notes (Signed)
   Covid-19 Vaccination Clinic  Name:  Catherine Morris    MRN: [id number] DOB: 18-Jun-1981  12/14/2019  [name] was observed post Covid-19 immunization for 15 minutes without incident. She was provided with Vaccine Information Sheet and instruction to access the V-Safe system.   [name] was instructed to call 911 with any severe reactions post vaccine: Marland Kitchen Difficulty breathing  . Swelling of face and throat  . A fast heartbeat  . A bad rash all over body  . Dizziness and weakness   Immunizations Administered    Name Date Dose VIS Date Route   Pfizer COVID-19 Vaccine 12/14/2019  2:38 PM 0.3 mL 08/23/2019 Intramuscular   Manufacturer: [employer], [employer]   Lot: [id number]   NDC: [address]-0

## 2020-01-08 ENCOUNTER — Ambulatory Visit: Payer: Self-pay | Attending: Internal Medicine

## 2020-01-08 DIAGNOSIS — Z23 Encounter for immunization: Secondary | ICD-10-CM

## 2020-01-08 NOTE — Progress Notes (Signed)
   Covid-19 Vaccination Clinic  Name:  Tatiana Courter    MRN: [id number] DOB: 01/19/1981  01/08/2020  [name] was observed post Covid-19 immunization for 15 minutes without incident. She was provided with Vaccine Information Sheet and instruction to access the V-Safe system.   [name] was instructed to call 911 with any severe reactions post vaccine: Marland Kitchen Difficulty breathing  . Swelling of face and throat  . A fast heartbeat  . A bad rash all over body  . Dizziness and weakness   Immunizations Administered    Name Date Dose VIS Date Route   Pfizer COVID-19 Vaccine 01/08/2020  2:40 PM 0.3 mL 11/06/2018 Intramuscular   Manufacturer: [employer], [employer]   Lot: [id number]   NDC: [address]-4

## 2020-05-04 ENCOUNTER — Encounter: Payer: Self-pay | Admitting: Podiatry

## 2020-05-04 ENCOUNTER — Other Ambulatory Visit: Payer: Self-pay

## 2020-05-04 ENCOUNTER — Ambulatory Visit: Payer: Self-pay | Admitting: Podiatry

## 2020-05-04 DIAGNOSIS — M2041 Other hammer toe(s) (acquired), right foot: Secondary | ICD-10-CM

## 2020-05-04 NOTE — Progress Notes (Signed)
   HPI: 39 y.o. female presenting today as a new patient for evaluation of a hammertoe to the right fourth toe.  Patient states that she experiences pain to the lateral aspect of the right fourth toe with certain shoe gear.  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  She has noticed significant pain and tenderness to the right fourth toe especially in close toed shoes.  She is referred here by a local podiatrist, Dr. Elijah Birk for further treatment and evaluation possible surgery  Past Medical History:  Diagnosis Date  . Back pain   . Bipolar disorder (HCC)   . Depression   . Knee pain      Physical Exam: General: The patient is alert and oriented x3 in no acute distress.  Dermatology: Skin is warm, dry and supple bilateral lower extremities. Negative for open lesions or macerations.  Vascular: Palpable pedal pulses bilaterally. No edema or erythema noted. Capillary refill within normal limits.  Neurological: Epicritic and protective threshold grossly intact bilaterally.   Musculoskeletal Exam: Range of motion within normal limits to all pedal and ankle joints bilateral. Muscle strength 5/5 in all groups bilateral. Hypertrophic head of the proximal phalanx of the right fourth toe noted with palpation.  There is some sensitivity to palpation as well.  The hypertrophic head of the proximal phalanx is most evident on the lateral aspect that pushes against the fifth toe.  Assessment: 1.  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 Patient evaluated.  2.  OTC corn and callus pads were applied and provided for the patient today 3.  Recommend new balance wide fitting nonslip resistant shoes 4.  Today we did discuss that surgery may be an option to alleviate her symptoms.  Patient currently does not have insurance and would like to know the cost of surgery.  Message sent to our billing  specialist to provide the patient with cost of surgery.  Surgery will be performed here in the office and consist of the PIPJ arthroplasty 5.  Return to clinic as needed      Felecia Shelling, DPM [facility]  Dr. Felecia Shelling, DPM    [address] [place], [place] [address]                Office [phone]  Fax [phone]

## 2020-05-08 ENCOUNTER — Other Ambulatory Visit: Payer: Self-pay

## 2020-05-08 ENCOUNTER — Encounter (HOSPITAL_COMMUNITY): Payer: Self-pay | Admitting: Family Medicine

## 2020-05-08 ENCOUNTER — Inpatient Hospital Stay (HOSPITAL_COMMUNITY)
Admission: AD | Admit: 2020-05-08 | Discharge: 2020-05-08 | Disposition: A | Discharge status: Home / Self Care | Payer: Self-pay | Attending: Family Medicine | Admitting: Family Medicine

## 2020-05-08 ENCOUNTER — Inpatient Hospital Stay (HOSPITAL_COMMUNITY): Payer: Self-pay

## 2020-05-08 DIAGNOSIS — Z79899 Other long term (current) drug therapy: Secondary | ICD-10-CM | POA: Insufficient documentation

## 2020-05-08 DIAGNOSIS — O24112 Pre-existing diabetes mellitus, type 2, in pregnancy, second trimester: Secondary | ICD-10-CM | POA: Insufficient documentation

## 2020-05-08 DIAGNOSIS — F319 Bipolar disorder, unspecified: Secondary | ICD-10-CM | POA: Insufficient documentation

## 2020-05-08 DIAGNOSIS — O09521 Supervision of elderly multigravida, first trimester: Secondary | ICD-10-CM | POA: Insufficient documentation

## 2020-05-08 DIAGNOSIS — O209 Hemorrhage in early pregnancy, unspecified: Secondary | ICD-10-CM

## 2020-05-08 DIAGNOSIS — Z7984 Long term (current) use of oral hypoglycemic drugs: Secondary | ICD-10-CM | POA: Insufficient documentation

## 2020-05-08 DIAGNOSIS — O26891 Other specified pregnancy related conditions, first trimester: Secondary | ICD-10-CM | POA: Insufficient documentation

## 2020-05-08 DIAGNOSIS — Z833 Family history of diabetes mellitus: Secondary | ICD-10-CM | POA: Insufficient documentation

## 2020-05-08 DIAGNOSIS — Z8249 Family history of ischemic heart disease and other diseases of the circulatory system: Secondary | ICD-10-CM | POA: Insufficient documentation

## 2020-05-08 DIAGNOSIS — O208 Other hemorrhage in early pregnancy: Secondary | ICD-10-CM | POA: Insufficient documentation

## 2020-05-08 DIAGNOSIS — R109 Unspecified abdominal pain: Secondary | ICD-10-CM

## 2020-05-08 DIAGNOSIS — O99341 Other mental disorders complicating pregnancy, first trimester: Secondary | ICD-10-CM | POA: Insufficient documentation

## 2020-05-08 DIAGNOSIS — Z791 Long term (current) use of non-steroidal anti-inflammatories (NSAID): Secondary | ICD-10-CM | POA: Insufficient documentation

## 2020-05-08 DIAGNOSIS — Z3A01 Less than 8 weeks gestation of pregnancy: Secondary | ICD-10-CM | POA: Insufficient documentation

## 2020-05-08 HISTORY — DX: Type 2 diabetes mellitus without complications: E11.9

## 2020-05-08 HISTORY — DX: Gestational diabetes mellitus in pregnancy, unspecified control: O24.419

## 2020-05-08 LAB — CBC
HCT: 38.2 % (ref 36.0–46.0)
Hemoglobin: 13.5 g/dL (ref 12.0–15.0)
MCH: 31 pg (ref 26.0–34.0)
MCHC: 35.3 g/dL (ref 30.0–36.0)
MCV: 87.8 fL (ref 80.0–100.0)
Platelets: 403 10*3/uL — ABNORMAL HIGH (ref 150–400)
RBC: 4.35 MIL/uL (ref 3.87–5.11)
RDW: 11.7 % (ref 11.5–15.5)
WBC: 10.4 10*3/uL (ref 4.0–10.5)
nRBC: 0 % (ref 0.0–0.2)

## 2020-05-08 LAB — URINALYSIS, ROUTINE W REFLEX MICROSCOPIC
Bilirubin Urine: NEGATIVE
Glucose, UA: >=500 mg/dL — AB
Ketones, ur: NEGATIVE mg/dL
Leukocytes,Ua: NEGATIVE
Nitrite: NEGATIVE
Protein, ur: NEGATIVE mg/dL
Specific Gravity, Urine: 1.001 — ABNORMAL LOW (ref 1.005–1.030)
pH: 7 (ref 5.0–8.0)

## 2020-05-08 LAB — WET PREP, GENITAL
Clue Cells Wet Prep HPF POC: NONE SEEN
Sperm: NONE SEEN
Trich, Wet Prep: NONE SEEN
WBC, Wet Prep HPF POC: NONE SEEN
Yeast Wet Prep HPF POC: NONE SEEN

## 2020-05-08 LAB — ABO/RH: ABO/RH(D): O POS

## 2020-05-08 LAB — COMPREHENSIVE METABOLIC PANEL
ALT: 17 U/L (ref 0–44)
AST: 17 U/L (ref 15–41)
Albumin: 3.9 g/dL (ref 3.5–5.0)
Alkaline Phosphatase: 74 U/L (ref 38–126)
Anion gap: 11 (ref 5–15)
BUN: 7 mg/dL (ref 6–20)
CO2: 24 mmol/L (ref 22–32)
Calcium: 9.7 mg/dL (ref 8.9–10.3)
Chloride: 101 mmol/L (ref 98–111)
Creatinine, Ser: 0.53 mg/dL (ref 0.44–1.00)
GFR calc Af Amer: >60 mL/min (ref >60)
GFR calc non Af Amer: >60 mL/min (ref >60)
Glucose, Bld: 77 mg/dL (ref 70–99)
Potassium: 3.7 mmol/L (ref 3.5–5.1)
Sodium: 136 mmol/L (ref 135–145)
Total Bilirubin: 0.4 mg/dL (ref 0.3–1.2)
Total Protein: 7.2 g/dL (ref 6.5–8.1)

## 2020-05-08 LAB — POCT PREGNANCY, URINE: Preg Test, Ur: POSITIVE — AB

## 2020-05-08 LAB — HCG, QUANTITATIVE, PREGNANCY: hCG, Beta Chain, Quant, S: 60721 m[IU]/mL — ABNORMAL HIGH (ref <5)

## 2020-05-08 NOTE — Discharge Instructions (Signed)
Hematoma subcoriónico °Subchorionic Hematoma ° °Un hematoma subcoriónico es una acumulación de sangre entre la pared externa del embrión (corion) y la pared interna de la matriz (útero). °Esta afección puede causar hemorragia vaginal. Si causan poca o nada de hemorragia vaginal, generalmente, los hematomas pequeños que ocurren al principio del embarazo se reducen por su propia cuenta y no afectan al bebé ni al embarazo. Cuando la hemorragia comienza más tarde en el embarazo, o el hematoma es más grande o se produce en una paciente de edad avanzada, la afección puede ser más grave. Los hematomas más grandes pueden agrandarse aún más, lo que aumenta las posibilidades de aborto espontáneo. Esta afección también aumenta los siguientes riesgos: °· Separación prematura de la placenta del útero. °· Parto antes de término (prematuro). °· Muerte fetal. °¿Cuáles son las causas? °Se desconoce la causa exacta de esta afección. Ocurre cuando la sangre queda atrapada entre la placenta y la pared uterina porque la placenta se ha separado del lugar original del implante. °¿Qué incrementa el riesgo? °Es más probable que desarrolle esta afección si: °· Recibió tratamiento con medicamentos para la fertilidad. °· La concepción se realizó a través de la fertilización in vitro (FIV). °¿Cuáles son los signos o los síntomas? °Los síntomas de esta afección incluyen los siguientes: °· Pérdida o hemorragia vaginal. °· Contracciones del útero. Estas contracciones provocan dolor abdominal. °En ocasiones, puede no haber síntomas y la hemorragia solo se puede ver cuando se toman imágenes ecográficas (ecografía transvaginal). °¿Cómo se diagnostica? °Esta afección se diagnostica con un examen físico. Es un examen pélvico. También pueden hacerle otros estudios, por ejemplo: °· Análisis de sangre. °· Análisis de orina. °· Ecografía del abdomen. °¿Cómo se trata? °El tratamiento de esta afección puede variar. El tratamiento puede incluir lo  siguiente: °· Observación cautelosa. La observarán atentamente para detectar cualquier cambio en la hemorragia. Durante esta etapa: °? El hematoma puede reabsorberse en el cuerpo. °? El hematoma puede separar el espacio lleno de líquido que contiene al embrión (saco gestacional) de la pared del útero (endometrio). °· Medicamentos. °· Restricción de las actividades. Puede ser necesaria hasta que se detenga la hemorragia. °Siga estas indicaciones en su casa: °· Haga reposo en cama si se lo indica el médico. °· No levante ningún objeto que pese más de 10 libras (4,5 kg) o siga las indicaciones del médico. °· No consuma ningún producto que contenga nicotina o tabaco, como cigarrillos y cigarrillos electrónicos. Si necesita ayuda para dejar de fumar, consulte al médico. °· Lleve un registro escrito de la cantidad de toallas higiénicas que utiliza cada día y cuán empapadas (saturadas) están. °· No use tampones. °· Concurra a todas las visitas de control como se lo haya indicado el médico. Esto es importante. El profesional podrá pedirle que se realice análisis de seguimiento, ecografías o ambas. °Comuníquese con un médico si: °· Tiene una hemorragia vaginal. °· Tiene fiebre. °Solicite ayuda de inmediato si: °· Siente calambres intensos en el estómago, en la espalda, en el abdomen o en la pelvis. °· Elimina coágulos o tejidos grandes. Guarde los tejidos para que su médico los vea. °· Tiene más hemorragia vaginal, y se desmaya o se siente mareada o débil. °Resumen °· Un hematoma subcoriónico es una acumulación de sangre entre la pared externa de la placenta y el útero. °· Esta afección puede causar hemorragia vaginal. °· En ocasiones, puede no haber síntomas y la hemorragia solo se puede ver cuando se toman imágenes ecográficas. °· El tratamiento puede incluir una observación cautelosa, medicamentos   o restriccin de las [address]. Esta informacin no tiene Theme park manager el consejo del mdico. Asegrese de hacerle  al mdico cualquier pregunta que tenga. Document Revised: 06/08/2017 Document Reviewed: 06/08/2017 Elsevier Patient Education  2020 [employer].

## 2020-05-08 NOTE — MAU Provider Note (Signed)
History     CSN: [id number]  Arrival date and time: 05/08/20 6314   First Provider Initiated Contact with Patient 05/08/20 1006      Chief Complaint  Patient presents with  . Possible Pregnancy  . Vaginal Bleeding   HPI Catherine Morris is a 39 y.o. [id number] at [redacted]w[redacted]d who presents to MAU with chief complaint of suprapubic cramping and vaginal bleeding. These are new problems, onset this morning.  Patient's abdominal pain score is 3/10, non-radiating. She has not taken medication or tried other treatments for her complaint. Her vaginal bleeding is "just a little". She has not donned a pad. Most recent sexual intercourse last night. She denies abdominal tenderness, dysuria, fever or recent illness.  OB History    Gravida  3   Para  2   Term  1   Preterm  1   AB      Living  2     SAB      TAB      Ectopic      Multiple      Live Births              Past Medical History:  Diagnosis Date  . Back pain   . Bipolar disorder (HCC)   . Depression   . Diabetes mellitus without complication (HCC)   . Gestational diabetes   . Knee pain     History reviewed. No pertinent surgical history.  Family History  Problem Relation Age of Onset  . Hypertension Mother   . Diabetes Father   . Diabetes Brother     Social History   Tobacco Use  . Smoking status: Never Smoker  . Smokeless tobacco: Never Used  Substance Use Topics  . Alcohol use: No  . Drug use: No    Allergies: No Known Allergies  Medications Prior to Admission  Medication Sig Dispense Refill Last Dose  . ALPRAZolam (XANAX) 1 MG tablet Take 1 mg by mouth 2 (two) times daily as needed for anxiety.   05/07/2020 at Unknown time  . metFORMIN (GLUCOPHAGE) 1000 MG tablet Take 1,000 mg by mouth 2 (two) times daily with a meal.     . Prenatal Vit-Fe Fumarate-FA (PRENATAL MULTIVITAMIN) TABS tablet Take 1 tablet by mouth daily at 12 noon.     Marland Kitchen amitriptyline (ELAVIL) 25 MG tablet TAKE ONE TABLET BY  MOUTH AT BEDTIME 30 tablet 3   . cyclobenzaprine (FLEXERIL) 5 MG tablet Take 5 mg by mouth at bedtime.     Marland Kitchen FLUoxetine (PROZAC) 10 MG tablet Take 10 mg by mouth daily.     . metaxalone (SKELAXIN) 800 MG tablet Take 1 tablet (800 mg total) by mouth 3 (three) times daily. As muscle relaxer. 30 tablet 0   . naproxen (NAPROSYN) 500 MG tablet Take 1 tablet (500 mg total) by mouth 2 (two) times daily with a meal. 60 tablet 0   . SUMAtriptan (IMITREX) 20 MG/ACT nasal spray Place 1 spray in one nostril.  May repeat x1 in 2 hours if headache persists or recurs. 1 Inhaler 0     Review of Systems  Gastrointestinal: Positive for abdominal pain.  Genitourinary: Positive for vaginal bleeding.  Musculoskeletal: Negative for back pain.  All other systems reviewed and are negative.  Physical Exam   Blood pressure 119/83, pulse 87, temperature 98.2 F (36.8 C), resp. rate 16, height 5\' 1"  (1.549 m), weight 60.3 kg, last menstrual period 03/15/2020, SpO2 99 %.  Physical Exam Vitals and  nursing note reviewed. Exam conducted with a chaperone present.  Cardiovascular:     Rate and Rhythm: Normal rate.     Pulses: Normal pulses.     Heart sounds: Normal heart sounds.  Pulmonary:     Effort: Pulmonary effort is normal.     Breath sounds: Normal breath sounds.  Abdominal:     General: Abdomen is flat. Bowel sounds are normal.     Tenderness: There is no abdominal tenderness. There is no right CVA tenderness or left CVA tenderness.  Genitourinary:    Comments: Pelvic exam declined by patient. Swabs collected via blind swab. Scant dark red discharge on pad  Skin:    General: Skin is warm and dry.     Capillary Refill: Capillary refill takes less than 2 seconds.  Neurological:     General: No focal deficit present.     Mental Status: She is alert.  Psychiatric:        Mood and Affect: Mood normal.        Thought Content: Thought content normal.        Judgment: Judgment normal.     MAU Course   Procedures  Patient Vitals for the past 24 hrs:  BP Temp Pulse Resp SpO2 Height Weight  05/08/20 1242 (!) 106/59 -- 83 16 -- -- --  05/08/20 0944 119/83 98.2 F (36.8 C) 87 16 99 % 5\' 1"  (1.549 m) 60.3 kg   Results for orders placed or performed during the hospital encounter of 05/08/20 (from the past 24 hour(s))  Pregnancy, urine POC     Status: Abnormal   Collection Time: 05/08/20  9:34 AM  Result Value Ref Range   Preg Test, Ur POSITIVE (A) NEGATIVE  Urinalysis, Routine w reflex microscopic Urine, Clean Catch     Status: Abnormal   Collection Time: 05/08/20  9:39 AM  Result Value Ref Range   Color, Urine STRAW (A) YELLOW   APPearance CLEAR CLEAR   Specific Gravity, Urine 1.001 (L) 1.005 - 1.030   pH 7.0 5.0 - 8.0   Glucose, UA >=500 (A) NEGATIVE mg/dL   Hgb urine dipstick LARGE (A) NEGATIVE   Bilirubin Urine NEGATIVE NEGATIVE   Ketones, ur NEGATIVE NEGATIVE mg/dL   Protein, ur NEGATIVE NEGATIVE mg/dL   Nitrite NEGATIVE NEGATIVE   Leukocytes,Ua NEGATIVE NEGATIVE   WBC, UA 0-5 0 - 5 WBC/hpf   Bacteria, UA RARE (A) NONE SEEN   Squamous Epithelial / LPF 0-5 0 - 5  Wet prep, genital     Status: None   Collection Time: 05/08/20 10:19 AM  Result Value Ref Range   Yeast Wet Prep HPF POC NONE SEEN NONE SEEN   Trich, Wet Prep NONE SEEN NONE SEEN   Clue Cells Wet Prep HPF POC NONE SEEN NONE SEEN   WBC, Wet Prep HPF POC NONE SEEN NONE SEEN   Sperm NONE SEEN   CBC     Status: Abnormal   Collection Time: 05/08/20 10:40 AM  Result Value Ref Range   WBC 10.4 4.0 - 10.5 K/uL   RBC 4.35 3.87 - 5.11 MIL/uL   Hemoglobin 13.5 12.0 - 15.0 g/dL   HCT 05/10/20 36 - 46 %   MCV 87.8 80.0 - 100.0 fL   MCH 31.0 26.0 - 34.0 pg   MCHC 35.3 30.0 - 36.0 g/dL   RDW 19.6 22.2 - 97.9 %   Platelets 403 (H) 150 - 400 K/uL   nRBC 0.0 0.0 - 0.2 %  Comprehensive  metabolic panel     Status: None   Collection Time: 05/08/20 10:40 AM  Result Value Ref Range   Sodium 136 135 - 145 mmol/L   Potassium 3.7  3.5 - 5.1 mmol/L   Chloride 101 98 - 111 mmol/L   CO2 24 22 - 32 mmol/L   Glucose, Bld 77 70 - 99 mg/dL   BUN 7 6 - 20 mg/dL   Creatinine, Ser 3.26 0.44 - 1.00 mg/dL   Calcium 9.7 8.9 - 71.2 mg/dL   Total Protein 7.2 6.5 - 8.1 g/dL   Albumin 3.9 3.5 - 5.0 g/dL   AST 17 15 - 41 U/L   ALT 17 0 - 44 U/L   Alkaline Phosphatase 74 38 - 126 U/L   Total Bilirubin 0.4 0.3 - 1.2 mg/dL   GFR calc non Af Amer >60 >60 mL/min   GFR calc Af Amer >60 >60 mL/min   Anion gap 11 5 - 15  hCG, quantitative, pregnancy     Status: Abnormal   Collection Time: 05/08/20 10:40 AM  Result Value Ref Range   hCG, Beta Chain, Quant, S 60,721 (H) <5 mIU/mL  ABO/Rh     Status: None   Collection Time: 05/08/20 10:40 AM  Result Value Ref Range   ABO/RH(D) O POS    No rh immune globuloin      NOT A RH IMMUNE GLOBULIN CANDIDATE, PT RH POSITIVE Performed at [facility], [address], [place], [place] [address]    US OB LESS THAN 14 WEEKS WITH OB TRANSVAGINAL  Result Date: 05/08/2020 CLINICAL DATA:  Pelvic pain and vaginal bleeding in early pregnancy. EXAM: OBSTETRIC <14 WK [place] AND TRANSVAGINAL OB US TECHNIQUE: Both transabdominal and transvaginal ultrasound examinations were performed for complete evaluation of the gestation as well as the maternal uterus, adnexal regions, and pelvic cul-de-sac. Transvaginal technique was performed to assess early pregnancy. COMPARISON:  None. FINDINGS: Intrauterine gestational sac: Single Yolk sac:  Visualized. Embryo:  Visualized. Cardiac Activity: Visualized. Heart Rate: 127 bpm CRL:  9 mm   6 w   5 d                  [place] EDC: 12/27/2020 Subchorionic hemorrhage:  Small subchorionic hemorrhage noted. Maternal uterus/adnexae: Neither ovary is directly visualized, however no adnexal mass or abnormal free fluid identified. IMPRESSION: Single living IUP with estimated gestational age of [redacted] weeks 5 days, and [place] EDC of 12/27/2020. Small subchorionic hemorrhage noted. Electronically  Signed   By: Danae Orleans M.D.   On: 05/08/2020 11:43   Assessment and Plan  --39 y.o. [id number] with SIUP at [redacted]w[redacted]d for TVUS --Subchorionic hemorrhage, pelvic rest advised --Blood type O POS, Rhogam not indicated --Language barrier: interpreter Bonnye Fava present for all patient interaction --Discharge home in stable condition   F/U: --Patient plans to establish prenatal care with Buel Ream, CNM 05/08/2020, 2:09 PM

## 2020-05-08 NOTE — MAU Note (Signed)
. °  Catherine Morris is a 39 y.o. at [redacted]w[redacted]d here in MAU reporting: she started having vaginal bleeding this morning with lower abdominal cramping. Last intercourse last night LMP: 03/15/20 Onset of complaint: this morning Pain score: 3 Vitals:   05/08/20 0944  BP: 119/83  Pulse: 87  Resp: 16  Temp: 98.2 F (36.8 C)  SpO2: 99%     FHT: Lab orders placed from triage: UA/UPT

## 2020-05-11 LAB — GC/CHLAMYDIA PROBE AMP (~~LOC~~) NOT AT ARMC
Chlamydia: NEGATIVE
Comment: NEGATIVE
Comment: NORMAL
Neisseria Gonorrhea: NEGATIVE

## 2020-06-19 ENCOUNTER — Other Ambulatory Visit: Payer: Self-pay

## 2020-06-19 ENCOUNTER — Encounter: Payer: Self-pay | Admitting: Obstetrics and Gynecology

## 2020-06-19 ENCOUNTER — Ambulatory Visit (INDEPENDENT_AMBULATORY_CARE_PROVIDER_SITE_OTHER): Payer: Self-pay | Admitting: Obstetrics and Gynecology

## 2020-06-19 ENCOUNTER — Other Ambulatory Visit (HOSPITAL_COMMUNITY)
Admission: RE | Admit: 2020-06-19 | Discharge: 2020-06-19 | Disposition: A | Discharge status: Home / Self Care | Payer: Self-pay | Source: Ambulatory Visit | Attending: Obstetrics and Gynecology | Admitting: Obstetrics and Gynecology

## 2020-06-19 VITALS — BP 106/71 | HR 88 | Wt 131.8 lb

## 2020-06-19 DIAGNOSIS — O09529 Supervision of elderly multigravida, unspecified trimester: Secondary | ICD-10-CM | POA: Insufficient documentation

## 2020-06-19 DIAGNOSIS — Z3A12 12 weeks gestation of pregnancy: Secondary | ICD-10-CM

## 2020-06-19 DIAGNOSIS — O09521 Supervision of elderly multigravida, first trimester: Secondary | ICD-10-CM | POA: Insufficient documentation

## 2020-06-19 DIAGNOSIS — E119 Type 2 diabetes mellitus without complications: Secondary | ICD-10-CM | POA: Insufficient documentation

## 2020-06-19 DIAGNOSIS — K219 Gastro-esophageal reflux disease without esophagitis: Secondary | ICD-10-CM

## 2020-06-19 DIAGNOSIS — O0990 Supervision of high risk pregnancy, unspecified, unspecified trimester: Secondary | ICD-10-CM | POA: Insufficient documentation

## 2020-06-19 DIAGNOSIS — Z789 Other specified health status: Secondary | ICD-10-CM

## 2020-06-19 DIAGNOSIS — Z8669 Personal history of other diseases of the nervous system and sense organs: Secondary | ICD-10-CM

## 2020-06-19 DIAGNOSIS — O24111 Pre-existing diabetes mellitus, type 2, in pregnancy, first trimester: Secondary | ICD-10-CM | POA: Insufficient documentation

## 2020-06-19 LAB — GLUCOSE, CAPILLARY: Glucose-Capillary: 102 mg/dL — ABNORMAL HIGH (ref 70–99)

## 2020-06-19 MED ORDER — FAMOTIDINE 20 MG PO TABS: 20.0000 mg | ORAL_TABLET | Freq: Two times a day (BID) | ORAL | 3 refills | Status: DC

## 2020-06-19 MED ORDER — ASPIRIN EC 81 MG PO TBEC: 81.0000 mg | DELAYED_RELEASE_TABLET | Freq: Every day | ORAL | 10 refills | Status: DC

## 2020-06-19 NOTE — Progress Notes (Signed)
New OB Note  06/19/2020   Clinic: [facility]  Chief Complaint: NOB  Transfer of Care Patient: Yes from [facility]. Pt states she only had an intake and no blood was drawn.   History of Present Illness: [name] is a 39 y.o. [id number] @ 12/5 weeks (EDC 4/17, based on 6wk u/s) Patient's last menstrual period was 03/15/2020.  Preg complicated by has Anxiety and depression; Chronic headache; Bilateral knee pain; Intractable chronic migraine without aura and without status migrainosus; Unspecified vitamin D deficiency; Supervision of high risk pregnancy, antepartum; Language barrier; Multigravida of advanced maternal age in first trimester; History of migraine; Gastroesophageal reflux disease; and Pre-existing type 2 diabetes mellitus during pregnancy in first trimester on their problem list.   Any events prior to today's visit: no She was using no method when she conceived.  She has mild signs or symptoms of nausea/vomiting of pregnancy and GERD She has Negative signs or symptoms of miscarriage or preterm labor  ROS: A 12-point review of systems was performed and negative, except as stated in the above HPI.  OBGYN History: As per HPI. OB History  Gravida Para Term Preterm AB Living  3 2 1 1   2   SAB TAB Ectopic Multiple Live Births               # Outcome Date GA Lbr Len/2nd Weight Sex Delivery Anes PTL Lv  3 Current           2 Preterm 2004 [redacted]w[redacted]d    Vag-Spont     1 Term 2003 [redacted]w[redacted]d    Vag-Spont       Any issues with any prior pregnancies:  G1-she states she had no PNC until 90m and that child came at around 35wks. She states she was vaginal, 6lbs 5oz and was able to be discharged home with her 11m states she had [facility] and child came early at 21-36wks, was 5lbs 8oz, vaginal. She states she gave birth on a Tuesday and the child came home on Friday.  Prior children are healthy, doing well, and without any problems or issues: yes History of pap  smears:  Unknown    Past Medical History: Past Medical History:  Diagnosis Date  . Back pain   . Bipolar disorder (HCC)   . Depression   . Diabetes mellitus without complication (HCC)   . Gestational diabetes   . Knee pain     Past Surgical History: No past surgical history on file.  Family History:  Family History  Problem Relation Age of Onset  . Hypertension Mother   . Diabetes Father   . Diabetes Brother     Social History:  Social History   Socioeconomic History  . Marital status: Single    Spouse name: Not on file  . Number of children: Not on file  . Years of education: Not on file  . Highest education level: Not on file  Occupational History  . Not on file  Tobacco Use  . Smoking status: Never Smoker  . Smokeless tobacco: Never Used  Substance and Sexual Activity  . Alcohol use: No  . Drug use: No  . Sexual activity: Yes    Partners: Male  Other Topics Concern  . Not on file  Social History Narrative  . Not on file   Social Determinants of Health   Financial Resource Strain:   . Difficulty of Paying Living Expenses: Not on file  Food Insecurity:   . Worried  About Running Out of Food in the Last Year: Not on file  . Ran Out of Food in the Last Year: Not on file  Transportation Needs:   . Lack of Transportation (Medical): Not on file  . Lack of Transportation (Non-Medical): Not on file  Physical Activity:   . Days of Exercise per Week: Not on file  . Minutes of Exercise per Session: Not on file  Stress:   . Feeling of Stress : Not on file  Social Connections:   . Frequency of Communication with Friends and Family: Not on file  . Frequency of Social Gatherings with Friends and Family: Not on file  . Attends Religious Services: Not on file  . Active Member of Clubs or Organizations: Not on file  . Attends Banker Meetings: Not on file  . Marital Status: Not on file  Intimate Partner Violence:   . Fear of Current or Ex-Partner:  Not on file  . Emotionally Abused: Not on file  . Physically Abused: Not on file  . Sexually Abused: Not on file    Allergy: No Known Allergies   Current Outpatient Medications: Prenatal vitamin Metformin 1000 bid  Physical Exam:   BP 106/71   Pulse 88   Wt 131 lb 12.8 oz (59.8 kg)   LMP 03/15/2020   BMI 24.90 kg/m  Body mass index is 24.9 kg/m. Contractions: Not present Vag. Bleeding: None. Fundal height: not applicable FHTs: 150s  General appearance: Well nourished, well developed female in no acute distress.  Neck:  Supple, normal appearance, and no thyromegaly  Cardiovascular: S1, S2 normal, no murmur, rub or gallop, regular rate and rhythm Respiratory:  Clear to auscultation bilateral. Normal respiratory effort Abdomen: positive bowel sounds and no masses, hernias; diffusely non tender to palpation, non distended Breasts: pt denies any breast s/s Neuro/Psych:  Normal mood and affect.  Skin:  Warm and dry.  Lymphatic:  No inguinal lymphadenopathy.   Pelvic exam: is not limited by body habitus EGBUS: within normal limits, Vagina: within normal limits and with no blood in the vault, Cervix: normal appearing cervix without discharge or lesions, closed/long/high, Uterus:  enlarged, c/w 12 week size, and Adnexa:  normal adnexa and no mass, fullness, tenderness  Laboratory: CBG 102 today July 2021 a1c 9.4, TSH normal  Imaging:  No new imaging  Narrative & Impression  CLINICAL DATA:  Pelvic pain and vaginal bleeding in early pregnancy.  EXAM: OBSTETRIC <14 WK [place] AND TRANSVAGINAL OB US  TECHNIQUE: Both transabdominal and transvaginal ultrasound examinations were performed for complete evaluation of the gestation as well as the maternal uterus, adnexal regions, and pelvic cul-de-sac. Transvaginal technique was performed to assess early pregnancy.  COMPARISON:  None.  FINDINGS: Intrauterine gestational sac: Single  Yolk sac:  Visualized.  Embryo:   Visualized.  Cardiac Activity: Visualized.  Heart Rate: 127 bpm  CRL:  9 mm   6 w   5 d                  [place] EDC: 12/27/2020  Subchorionic hemorrhage:  Small subchorionic hemorrhage noted.  Maternal uterus/adnexae: Neither ovary is directly visualized, however no adnexal mass or abnormal free fluid identified.  IMPRESSION: Single living IUP with estimated gestational age of [redacted] weeks 5 days, and [place] EDC of 12/27/2020.  Small subchorionic hemorrhage noted.   Electronically Signed   By: Danae Orleans M.D.   On: 05/08/2020 11:43     Assessment: pt stable  Plan: 1. Supervision  of high risk pregnancy, antepartum Routine care. Pt amenable to genetics and starting low dose ASA. Pt states she only has medicaid through this month.  Optho referral based on insurance status - Culture, OB Urine - [place] MFM OB DETAIL +14 WK; Future - CBC/D/Plt+RPR+Rh+ABO+Rub Ab... - Cytology - PAP( [place]) - Hemoglobin A1c - Comprehensive metabolic panel - Protein / creatinine ratio, urine - VITAMIN D 25 Hydroxy (Vit-D Deficiency, Fractures) - Referral to Nutrition and Diabetes Services - Ambulatory referral to Nutrition and Diabetic Education  2. Multigravida of advanced maternal age in first trimester See above  3. Gastroesophageal reflux disease, unspecified whether esophagitis present pepcid sent in  4. Language barrier Interpreter used  5. History of migraine No issues currently  6. Pre-existing type 2 diabetes mellitus during pregnancy in first trimester Based on [facility] labs in the system. She has not been taught how to check her sugars. ASAP dm education consult ordered. Pt told to stay on metformin 1000 bid but will need to see how her sugars are to see if she needs to start insulin.   Patient will need mfm anatomy u/s, fetal echo at 22-26wks - Ambulatory referral to Nutrition and Diabetic Education  Problem list reviewed and updated.  Follow up in 1  weeks.  >50% of 35 min visit spent on counseling and coordination of care.     Cornelia Copa MD Attending Center for [facility])

## 2020-06-19 NOTE — Progress Notes (Signed)
Recommend medications for nausea and heartburn Questions about diabetes   Signed up patient for babyscripts through the spanish platform

## 2020-06-20 LAB — COMPREHENSIVE METABOLIC PANEL
ALT: 10 IU/L (ref 0–32)
AST: 13 IU/L (ref 0–40)
Albumin/Globulin Ratio: 1.4 (ref 1.2–2.2)
Albumin: 4.2 g/dL (ref 3.8–4.8)
Alkaline Phosphatase: 67 IU/L (ref 44–121)
BUN/Creatinine Ratio: 15 (ref 9–23)
BUN: 6 mg/dL (ref 6–20)
Bilirubin Total: 0.2 mg/dL (ref 0.0–1.2)
CO2: 21 mmol/L (ref 20–29)
Calcium: 9.6 mg/dL (ref 8.7–10.2)
Chloride: 100 mmol/L (ref 96–106)
Creatinine, Ser: 0.4 mg/dL — ABNORMAL LOW (ref 0.57–1.00)
GFR calc Af Amer: 154 mL/min/{1.73_m2} (ref >59)
GFR calc non Af Amer: 133 mL/min/{1.73_m2} (ref >59)
Globulin, Total: 2.9 g/dL (ref 1.5–4.5)
Glucose: 85 mg/dL (ref 65–99)
Potassium: 3.7 mmol/L (ref 3.5–5.2)
Sodium: 135 mmol/L (ref 134–144)
Total Protein: 7.1 g/dL (ref 6.0–8.5)

## 2020-06-20 LAB — CBC/D/PLT+RPR+RH+ABO+RUB AB...
Antibody Screen: NEGATIVE
Basophils Absolute: 0 10*3/uL (ref 0.0–0.2)
Basos: 0 %
EOS (ABSOLUTE): 0.1 10*3/uL (ref 0.0–0.4)
Eos: 1 %
HCV Ab: <0.1 s/co ratio (ref 0.0–0.9)
HIV Screen 4th Generation wRfx: NONREACTIVE
Hematocrit: 37.1 % (ref 34.0–46.6)
Hemoglobin: 12.8 g/dL (ref 11.1–15.9)
Hepatitis B Surface Ag: NEGATIVE
Immature Grans (Abs): 0 10*3/uL (ref 0.0–0.1)
Immature Granulocytes: 0 %
Lymphocytes Absolute: 1.8 10*3/uL (ref 0.7–3.1)
Lymphs: 19 %
MCH: 30.7 pg (ref 26.6–33.0)
MCHC: 34.5 g/dL (ref 31.5–35.7)
MCV: 89 fL (ref 79–97)
Monocytes Absolute: 0.5 10*3/uL (ref 0.1–0.9)
Monocytes: 6 %
Neutrophils Absolute: 6.7 10*3/uL (ref 1.4–7.0)
Neutrophils: 74 %
Platelets: 369 10*3/uL (ref 150–450)
RBC: 4.17 x10E6/uL (ref 3.77–5.28)
RDW: 12.6 % (ref 11.7–15.4)
RPR Ser Ql: NONREACTIVE
Rh Factor: POSITIVE
Rubella Antibodies, IGG: 5.36 index (ref >0.99)
WBC: 9.1 10*3/uL (ref 3.4–10.8)

## 2020-06-20 LAB — HEMOGLOBIN A1C
Est. average glucose Bld gHb Est-mCnc: 120 mg/dL
Hgb A1c MFr Bld: 5.8 % — ABNORMAL HIGH (ref 4.8–5.6)

## 2020-06-20 LAB — HCV INTERPRETATION

## 2020-06-20 LAB — PROTEIN / CREATININE RATIO, URINE
Creatinine, Urine: 19.8 mg/dL
Protein, Ur: <4 mg/dL

## 2020-06-20 LAB — VITAMIN D 25 HYDROXY (VIT D DEFICIENCY, FRACTURES): Vit D, 25-Hydroxy: 29.8 ng/mL — ABNORMAL LOW (ref 30.0–100.0)

## 2020-06-22 LAB — CYTOLOGY - PAP
Chlamydia: NEGATIVE
Comment: NEGATIVE
Comment: NEGATIVE
Comment: NORMAL
Diagnosis: NEGATIVE
High risk HPV: NEGATIVE
Neisseria Gonorrhea: NEGATIVE

## 2020-06-24 LAB — URINE CULTURE, OB REFLEX

## 2020-06-24 LAB — CULTURE, OB URINE

## 2020-06-30 ENCOUNTER — Other Ambulatory Visit: Payer: Self-pay

## 2020-06-30 ENCOUNTER — Ambulatory Visit: Payer: Self-pay | Admitting: Registered"

## 2020-06-30 ENCOUNTER — Encounter: Payer: Self-pay | Attending: Obstetrics and Gynecology | Admitting: Registered"

## 2020-06-30 VITALS — Ht 61.0 in | Wt 129.1 lb

## 2020-06-30 DIAGNOSIS — O0990 Supervision of high risk pregnancy, unspecified, unspecified trimester: Secondary | ICD-10-CM | POA: Insufficient documentation

## 2020-06-30 DIAGNOSIS — Z3A00 Weeks of gestation of pregnancy not specified: Secondary | ICD-10-CM | POA: Insufficient documentation

## 2020-06-30 DIAGNOSIS — E118 Type 2 diabetes mellitus with unspecified complications: Secondary | ICD-10-CM | POA: Insufficient documentation

## 2020-06-30 DIAGNOSIS — O24111 Pre-existing diabetes mellitus, type 2, in pregnancy, first trimester: Secondary | ICD-10-CM | POA: Insufficient documentation

## 2020-06-30 NOTE — Progress Notes (Signed)
In-person Interpreter services provided by Sabino Snipes from [facility]  Patient was seen on 06/30/20 for Gestational Diabetes self-management. EDD 12/27/20. Patient states no history of GDM. Diet history obtained. Patient eats limited variety of food groups, reduced appetite.  Beverages include orange juice, lemonade, small amount of water because it makes her feel sick.   Pre-existing diabetes: RD review of Epic appears patient as had prediabetes for at least 5 years with A1c around 6%. Last lab value in Epic10/8/21 5.8%.  Patient states at recent MD visit at [facility], [address] Pt states she was told she has diabetes and her A1c was 9.5%. RD did not call practice because it is a Spanish location and did not want to use the appointment time trying to verify this A1c.   Per dietary recall and continued weight loss, patient has inadequate intake. Patient positive for N/V/D. Some potential causes could be sickness or recently starting metformin.  Wt Readings from Last 3 Encounters:  07/02/20 129 lb 1.6 oz (58.6 kg)  06/19/20 131 lb 12.8 oz (59.8 kg)  05/08/20 133 lb (60.3 kg)   Patient states she is vomiting sometimes at night and medication prescribed by MD is not helping. Patient was having difficulty describing her symptoms. Foods that she is eating that are easy on her stomach are are pasta soup, fruit, orange juice, cereal, soda. Patient states she eats some chicken but doesn't like it or fish lately.   RD thinks it could be medication related since patient states symptoms started about the same time as starting Metformin. On 9/17 started with 1/2 pill BID for one week and increased dose to full pill BID. Pt states she doesn't notice diarrhea right after taking metformin, but has had about 5x in last month.  Due to patient's symptoms most of visit was investigating symptoms. RD reviewed some information about foods that contain carbohydrates and importance of staying hydrates  and getting in some protein, but did not cover the complete education usually covered in GDM appointment. Pt states she is willing to return in a couple of weeks with her blood sugar readings. RD will assess weight gain as welll as blood sugar control.  The following learning objectives were met by the patient :   Describes the effects of carbohydrates on blood glucose levels  Demonstrates identifying carbohydrate (no time for carb counting)  States when to check blood glucose levels  Demonstrates proper blood glucose monitoring techniques   Plan:  Aim for more food that contain protein as tolerated. Begin checking Blood Glucose before breakfast and 2 hours after first bite of breakfast, lunch and dinner as directed by MD  Bring Log Book/Sheet and meter to every medical appointment  Baby Scripts: Patient not appropriate for Baby Scripts due to language barrier. Take medication if directed by MD  Blood glucose monitor given: [employer] Lot # [id number] CBG: 148 mg/dL  Patient instructed to monitor glucose levels: FBS: 60 - 95 mg/dl 2 hour: <120 mg/dl  Patient received the following handouts:  Nutrition Diabetes and Pregnancy  Carbohydrate Counting List  Blood glucose Log Sheet  Patient will be seen for follow-up 1 in weeks or as needed for blood sugar log check as well as weight check.

## 2020-07-02 ENCOUNTER — Other Ambulatory Visit: Payer: Self-pay

## 2020-07-02 ENCOUNTER — Encounter: Payer: Self-pay | Admitting: General Practice

## 2020-07-02 ENCOUNTER — Encounter: Payer: Self-pay | Admitting: Registered"

## 2020-07-07 ENCOUNTER — Encounter: Payer: Self-pay | Admitting: General Practice

## 2020-07-09 ENCOUNTER — Other Ambulatory Visit: Payer: Self-pay

## 2020-07-09 ENCOUNTER — Ambulatory Visit: Payer: Self-pay | Admitting: Registered"

## 2020-07-09 ENCOUNTER — Encounter: Payer: Self-pay | Attending: Obstetrics and Gynecology | Admitting: Registered"

## 2020-07-09 VITALS — Ht 61.0 in | Wt 129.9 lb

## 2020-07-09 DIAGNOSIS — O0990 Supervision of high risk pregnancy, unspecified, unspecified trimester: Secondary | ICD-10-CM | POA: Insufficient documentation

## 2020-07-09 DIAGNOSIS — O24111 Pre-existing diabetes mellitus, type 2, in pregnancy, first trimester: Secondary | ICD-10-CM

## 2020-07-09 DIAGNOSIS — Z3A00 Weeks of gestation of pregnancy not specified: Secondary | ICD-10-CM | POA: Insufficient documentation

## 2020-07-09 DIAGNOSIS — E118 Type 2 diabetes mellitus with unspecified complications: Secondary | ICD-10-CM | POA: Insufficient documentation

## 2020-07-09 NOTE — Progress Notes (Signed)
In-person Interpreter services provided by Eda from [facility]  Patient was seen on 07/09/20 for follow-up assessment and education for Gestational Diabetes. EDD 12/27/20, [redacted]w[redacted]d. Patient states changes to diet/lifestyle including reducing bread and cereal. Pt states the vomiting continues 1-2x/week will be in the morning, 3x/week in evening. Pt states food stays down better earlier in the day.  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 Pt states in the evening she often is tired and will go to sleep before it is time to check her 2 hour blood sugar. RD told her that if she will check at 1 hour that would give [place] some information to work with. Some of the numbers are more than 2 hours after eating.  Patient is testing blood glucose as directed pre breakfast and about 2 hours after some meals.    The following learning objectives reviewed during follow-up visit:   Potential effect of metformin on her GI upset  Adjust depth setting on lancing device for the fingers that are not getting much blood to test.  Plan:   Per Dr. Vergie Living, reduce Metformin to 500 mg BID with meals  Return for follow-up visit 11/2 for re-evaluation of blood sugar log.  Consider trying the Green Yogurt that you bought to get more protein in your diet. Continue eating fruit that is setting well with your stomach  RD will contact you in a couple of months to schedule a follow-up visit.  If you feel you need another nutrition visit before then, ask to be put on my schedule.  Patient instructed to monitor glucose levels: FBS: 60 - 95 mg/dl 2 hour: <086 mg/dl  Patient received the following  handouts: None  Patient will be seen for follow-up in 2 months or as needed.

## 2020-07-10 ENCOUNTER — Encounter: Payer: Self-pay | Admitting: *Deleted

## 2020-07-10 DIAGNOSIS — O0990 Supervision of high risk pregnancy, unspecified, unspecified trimester: Secondary | ICD-10-CM

## 2020-07-14 ENCOUNTER — Other Ambulatory Visit: Payer: Self-pay

## 2020-07-14 ENCOUNTER — Ambulatory Visit (INDEPENDENT_AMBULATORY_CARE_PROVIDER_SITE_OTHER): Payer: Self-pay | Admitting: Obstetrics & Gynecology

## 2020-07-14 ENCOUNTER — Encounter: Payer: Self-pay | Admitting: Obstetrics & Gynecology

## 2020-07-14 VITALS — BP 97/69 | HR 79 | Wt 131.6 lb

## 2020-07-14 DIAGNOSIS — O09521 Supervision of elderly multigravida, first trimester: Secondary | ICD-10-CM

## 2020-07-14 DIAGNOSIS — O24111 Pre-existing diabetes mellitus, type 2, in pregnancy, first trimester: Secondary | ICD-10-CM

## 2020-07-14 DIAGNOSIS — O0990 Supervision of high risk pregnancy, unspecified, unspecified trimester: Secondary | ICD-10-CM

## 2020-07-14 DIAGNOSIS — Z789 Other specified health status: Secondary | ICD-10-CM

## 2020-07-14 MED ORDER — PROMETHAZINE HCL 25 MG PO TABS: 25.0000 mg | ORAL_TABLET | Freq: Four times a day (QID) | ORAL | 2 refills | Status: DC | PRN

## 2020-07-14 MED ORDER — PANTOPRAZOLE SODIUM 20 MG PO TBEC: 20.0000 mg | DELAYED_RELEASE_TABLET | Freq: Every day | ORAL | 4 refills | Status: DC

## 2020-07-14 NOTE — Progress Notes (Signed)
Spanish [employer]

## 2020-07-14 NOTE — Progress Notes (Signed)
° °  PRENATAL VISIT NOTE  Subjective:  Catherine Morris is a 39 y.o. S2G3151 at [redacted]w[redacted]d being seen today for ongoing prenatal care.  She is currently monitored for the following issues for this high-risk pregnancy and has Anxiety and depression; Chronic headache; Bilateral knee pain; Intractable chronic migraine without aura and without status migrainosus; Unspecified vitamin D deficiency; Supervision of high risk pregnancy, antepartum; Language barrier; Multigravida of advanced maternal age in first trimester; History of migraine; Gastroesophageal reflux disease; and Pre-existing type 2 diabetes mellitus during pregnancy in first trimester on their problem list.  Patient reports nausea, vomiting and reflux.  Contractions: Not present. Vag. Bleeding: None.  Movement: Present. Denies leaking of fluid.   The following portions of the patient's history were reviewed and updated as appropriate: allergies, current medications, past family history, past medical history, past social history, past surgical history and problem list.   Objective:   Vitals:   07/14/20 1040  BP: 97/69  Pulse: 79  Weight: 131 lb 9.6 oz (59.7 kg)    Fetal Status: Fetal Heart Rate (bpm): 140   Movement: Present     General:  Alert, oriented and cooperative. Patient is in no acute distress.  Skin: Skin is warm and dry. No rash noted.   Cardiovascular: Normal heart rate noted  Respiratory: Normal respiratory effort, no problems with respiration noted  Abdomen: Soft, gravid, appropriate for gestational age.  Pain/Pressure: Absent     Pelvic: Cervical exam deferred        Extremities: Normal range of motion.  Edema: None  Mental Status: Normal mood and affect. Normal behavior. Normal judgment and thought content.   Assessment and Plan:  Pregnancy: G3P1102 at [redacted]w[redacted]d 1. Supervision of high risk pregnancy, antepartum AFP - pantoprazole (PROTONIX) 20 MG tablet; Take 1 tablet (20 mg total) by mouth daily.  Dispense:  30 tablet; Refill: 4 - promethazine (PHENERGAN) 25 MG tablet; Take 1 tablet (25 mg total) by mouth every 6 (six) hours as needed for nausea or vomiting.  Dispense: 30 tablet; Refill: 2 - [place] MFM OB DETAIL +14 WK; Future Sx reflux and nausea, Pepcid d/c 2. Language barrier Spanish interpreter  3. Multigravida of advanced maternal age in first trimester Second trimester  4. Pre-existing type 2 diabetes mellitus during pregnancy in first trimester BG control is good, BS data reviewed. Metformin, fetal echocardiogram - US MFM OB DETAIL +14 WK; Future  Preterm labor symptoms and general obstetric precautions including but not limited to vaginal bleeding, contractions, leaking of fluid and fetal movement were reviewed in detail with the patient. Please refer to After Visit Summary for other counseling recommendations.   Return in about 3 weeks (around 08/04/2020) for schedule [place].  No future appointments.  Scheryl Darter, MD

## 2020-07-14 NOTE — Patient Instructions (Signed)
Segundo trimestre de embarazo Second Trimester of Pregnancy  El segundo trimestre va desde la semana14 hasta la 27 (desde el mes 4 hasta el 6). Este suele ser el momento en el que mejor se siente. En general, las nuseas matutinas han disminuido o han desaparecido completamente. Tendr ms energa y podr aumentarle el apetito. El beb en gestacin se desarrolla rpidamente. Hacia el final del sexto mes, el beb mide aproximadamente 9 pulgadas (23 cm) y pesa alrededor de 1 libras (700 g). Es probable que sienta al beb moverse entre las 18 y 20 semanas del embarazo. Siga estas indicaciones en su casa: Medicamentos  Tome los medicamentos de venta libre y los recetados solamente como se lo haya indicado el mdico. Algunos medicamentos son seguros para tomar durante el embarazo y otros no lo son.  Tome vitaminas prenatales que contengan por lo menos 600microgramos (?g) de cido flico.  Si tiene dificultad para mover el intestino (estreimiento), tome un medicamento para ablandar las heces (laxante) si su mdico se lo autoriza. Comida y bebida   Ingiera alimentos saludables de manera regular.  No coma carne cruda ni quesos sin cocinar.  Si obtiene poca cantidad de calcio de los alimentos que ingiere, consulte a su mdico sobre la posibilidad de tomar un suplemento diario de calcio.  Evite el consumo de alimentos ricos en grasas y azcares, como los alimentos fritos y los dulces.  Si tiene malestar estomacal (nuseas) o devuelve (vomita): ? Ingiera 4 o 5comidas pequeas por da en lugar de 3abundantes. ? Intente comer algunas galletitas saladas. ? Beba lquidos entre las comidas, en lugar de hacerlo durante estas.  Para evitar el estreimiento: ? Consuma alimentos ricos en fibra, como frutas y verduras frescas, cereales integrales y frijoles. ? Beba suficiente lquido para mantener el pis (orina) claro o de color amarillo plido. Actividad  Haga ejercicios solamente como se lo haya  indicado el mdico. Interrumpa la actividad fsica si comienza a tener calambres.  No haga ejercicio si hace demasiado calor, hay demasiada humedad o se encuentra en un lugar de mucha altura (altitud alta).  Evite levantar pesos excesivos.  Use zapatos con tacones bajos. Mantenga una buena postura al sentarse y pararse.  Puede continuar teniendo relaciones sexuales, a menos que el mdico le indique lo contrario. Alivio del dolor y del malestar  Use un sostn que le brinde buen soporte si sus mamas estn sensibles.  Dese baos de asiento con agua tibia para aliviar el dolor o las molestias causadas por las hemorroides. Use una crema para las hemorroides si el mdico la autoriza.  Descanse con las piernas elevadas si tiene calambres o dolor de cintura.  Si desarrolla venas hinchadas y abultadas (vrices) en las piernas: ? Use medias de compresin o medias de descanso como se lo haya indicado el mdico. ? Levante (eleve) los pies durante 15minutos, 3 o 4veces por da. ? Limite el consumo de sal en sus alimentos. Cuidado prenatal  Escriba sus preguntas. Llvelas cuando concurra a las visitas prenatales.  Concurra a todas las visitas prenatales como se lo haya indicado el mdico. Esto es importante. Seguridad  Colquese el cinturn de seguridad cuando conduzca.  Haga una lista de los nmeros de telfono de emergencia, que incluya los nmeros de telfono de familiares, amigos, el hospital, as como los departamentos de polica y bomberos. Instrucciones generales  Consulte a su mdico sobre los alimentos que debe comer o pdale que la ayude a encontrar a quien pueda aconsejarla si necesita ese servicio.    Consulte a su mdico acerca de dnde se dictan clases prenatales cerca de donde vive. Comience las clases antes del mes 6 de embarazo.  No se d baos de inmersin en agua caliente, baos turcos ni saunas.  No se haga duchas vaginales ni use tampones o toallas higinicas perfumadas.   No mantenga las piernas cruzadas durante mucho tiempo.  Vaya al dentista si an no lo hizo. Use un cepillo de cerdas suaves para cepillarse los dientes. Psese el hilo dental suavemente.  No fume, no consuma hierbas ni beba alcohol. No tome frmacos que el mdico no haya autorizado.  No consuma ningn producto que contenga nicotina o tabaco, como cigarrillos y cigarrillos electrnicos. Si necesita ayuda para dejar de fumar, consulte al mdico.  Evite el contacto con las bandejas sanitarias de los gatos y la tierra que estos animales usan. Estos elementos contienen bacterias que pueden causar defectos congnitos al beb y la posible prdida del beb (aborto espontneo) o la muerte fetal. Comunquese con un mdico si:  Tiene clicos leves o siente presin en la parte baja del vientre.  Tiene dolor al hacer pis (orinar).  Advierte un lquido con olor ftido que proviene de la vagina.  Tiene malestar estomacal (nuseas), devuelve (vomita) o tiene deposiciones acuosas (diarrea).  Sufre un dolor persistente en el abdomen.  Siente mareos. Solicite ayuda de inmediato si:  Tiene fiebre.  Tiene una prdida de lquido por la vagina.  Tiene sangrado o pequeas prdidas vaginales.  Siente dolor intenso o clicos en el abdomen.  Sube o baja de peso rpidamente.  Tiene dificultades para recuperar el aliento y siente dolor en el pecho.  Sbitamente se le hinchan mucho el rostro, las manos, los tobillos, los pies o las piernas.  No ha sentido los movimientos del beb durante una hora.  Siente un dolor de cabeza intenso que no se alivia al tomar medicamentos.  Tiene dificultad para ver. Resumen  El segundo trimestre va desde la semana14 hasta la 27, desde el mes 4 hasta el 6. Este suele ser el momento en el que mejor se siente.  Para cuidarse y cuidar a su beb en gestacin, debe comer alimentos saludables, tomar medicamentos solamente si su mdico le indica que lo haga y hacer  actividades que sean seguras para usted y su beb.  Llame al mdico si se enferma o si nota algo inusual acerca de su embarazo. Tambin llame al mdico si necesita ayuda para saber qu alimentos debe comer o si quiere saber qu actividades puede realizar de forma segura. Esta informacin no tiene como fin reemplazar el consejo del mdico. Asegrese de hacerle al mdico cualquier pregunta que tenga. Document Revised: 05/24/2017 Document Reviewed: 05/24/2017 Elsevier Patient Education  2020 Elsevier Inc.  

## 2020-07-16 LAB — AFP, SERUM, OPEN SPINA BIFIDA
AFP MoM: 1.3
AFP Value: 49.9 ng/mL
Gest. Age on Collection Date: 16.3 weeks
Maternal Age At EDD: 38.2 yr
OSBR Risk 1 IN: 4803
Test Results:: NEGATIVE
Weight: 131 [lb_av]

## 2020-07-27 ENCOUNTER — Other Ambulatory Visit: Payer: Self-pay

## 2020-07-27 ENCOUNTER — Telehealth: Payer: Self-pay

## 2020-07-27 NOTE — Telephone Encounter (Addendum)
-----   Message from Reva Bores, MD sent at 07/27/2020  4:06 PM EST ----- Had only 13 CBG readings, we really want more (3-4 days)--can we call and encourage her.  Called pt with [facility] Interpreter # [phone] and left message that I am calling to f/u if your having issues with Babyscripts app to be able to log your blood sugars.  If you could please give the office a call back.    Addison Naegeli, RN  07/27/20

## 2020-07-29 ENCOUNTER — Telehealth: Payer: Self-pay | Admitting: *Deleted

## 2020-07-29 NOTE — Telephone Encounter (Signed)
Pt left VM message stating that she is returning a call.

## 2020-07-29 NOTE — Telephone Encounter (Signed)
Left message with Spanish Interpreter Eda R., requesting her to call the office and that we will f/u with her at visit on 08/04/20.    Addison Naegeli, RN

## 2020-08-03 ENCOUNTER — Ambulatory Visit: Payer: Self-pay | Attending: Obstetrics & Gynecology

## 2020-08-03 ENCOUNTER — Other Ambulatory Visit: Payer: Self-pay

## 2020-08-03 ENCOUNTER — Encounter: Payer: Self-pay | Admitting: *Deleted

## 2020-08-03 ENCOUNTER — Other Ambulatory Visit: Payer: Self-pay | Admitting: *Deleted

## 2020-08-03 ENCOUNTER — Ambulatory Visit: Payer: Self-pay | Admitting: *Deleted

## 2020-08-03 DIAGNOSIS — O24112 Pre-existing diabetes mellitus, type 2, in pregnancy, second trimester: Secondary | ICD-10-CM

## 2020-08-03 DIAGNOSIS — O0990 Supervision of high risk pregnancy, unspecified, unspecified trimester: Secondary | ICD-10-CM

## 2020-08-03 DIAGNOSIS — O24111 Pre-existing diabetes mellitus, type 2, in pregnancy, first trimester: Secondary | ICD-10-CM | POA: Insufficient documentation

## 2020-08-04 ENCOUNTER — Ambulatory Visit (INDEPENDENT_AMBULATORY_CARE_PROVIDER_SITE_OTHER): Payer: Self-pay | Admitting: Family Medicine

## 2020-08-04 VITALS — BP 97/65 | HR 76 | Wt 131.8 lb

## 2020-08-04 DIAGNOSIS — F32A Depression, unspecified: Secondary | ICD-10-CM

## 2020-08-04 DIAGNOSIS — O0990 Supervision of high risk pregnancy, unspecified, unspecified trimester: Secondary | ICD-10-CM

## 2020-08-04 DIAGNOSIS — O09529 Supervision of elderly multigravida, unspecified trimester: Secondary | ICD-10-CM

## 2020-08-04 DIAGNOSIS — E559 Vitamin D deficiency, unspecified: Secondary | ICD-10-CM

## 2020-08-04 DIAGNOSIS — Z789 Other specified health status: Secondary | ICD-10-CM

## 2020-08-04 DIAGNOSIS — O219 Vomiting of pregnancy, unspecified: Secondary | ICD-10-CM

## 2020-08-04 DIAGNOSIS — O24111 Pre-existing diabetes mellitus, type 2, in pregnancy, first trimester: Secondary | ICD-10-CM

## 2020-08-04 DIAGNOSIS — F419 Anxiety disorder, unspecified: Secondary | ICD-10-CM

## 2020-08-04 LAB — POCT URINALYSIS DIP (DEVICE)
Bilirubin Urine: NEGATIVE
Glucose, UA: 500 mg/dL — AB
Hgb urine dipstick: NEGATIVE
Ketones, ur: NEGATIVE mg/dL
Nitrite: NEGATIVE
Protein, ur: NEGATIVE mg/dL
Specific Gravity, Urine: 1.025 (ref 1.005–1.030)
Urobilinogen, UA: 1 mg/dL (ref 0.0–1.0)
pH: 7 (ref 5.0–8.0)

## 2020-08-04 NOTE — Progress Notes (Signed)
   PRENATAL VISIT NOTE  Subjective:  Catherine Morris is a 39 y.o. F6O1308 at [redacted]w[redacted]d being seen today for ongoing prenatal care.  She is currently monitored for the following issues for this high-risk pregnancy and has Anxiety and depression; Chronic headache; Bilateral knee pain; Intractable chronic migraine without aura and without status migrainosus; Vitamin D deficiency; Supervision of high risk pregnancy, antepartum; Language barrier; AMA (advanced maternal age) multigravida 35+; History of migraine; Gastroesophageal reflux disease; and Pre-existing type 2 diabetes mellitus during pregnancy in first trimester on their problem list.  Patient reports nausea, vomiting and reflux. Taking protonix and phenergan with some relief of symptoms. Contractions: Not present. Vag. Bleeding: None.  Movement: Present. Denies leaking of fluid.   The following portions of the patient's history were reviewed and updated as appropriate: allergies, current medications, past family history, past medical history, past social history, past surgical history and problem list.   Objective:   Vitals:   08/04/20 0931  BP: 97/65  Pulse: 76  Weight: 131 lb 12.8 oz (59.8 kg)    Fetal Status: Fetal Heart Rate (bpm): 146   Movement: Present     General:  Alert, oriented and cooperative. Patient is in no acute distress.  Skin: Skin is warm and dry. No rash noted.   Cardiovascular: Normal heart rate noted  Respiratory: Normal respiratory effort, no problems with respiration noted  Abdomen: Soft, gravid, appropriate for gestational age.  Pain/Pressure: Absent     Pelvic: Cervical exam deferred        Extremities: Normal range of motion.  Edema: None  Mental Status: Normal mood and affect. Normal behavior. Normal judgment and thought content.   Assessment and Plan:  Pregnancy: G3P1102 at [redacted]w[redacted]d 1. Supervision of high risk pregnancy, antepartum -taking prenatal vitamin and bASA -discussed contraception,  elects for Paragard IUD, has had previously, will place at [facility] pp  2. Language barrier [employer] spanish interpreter used.  3. Multigravida of advanced maternal age  1. Pre-existing type 2 diabetes mellitus during pregnancy in first trimester BGL well controlled this week and last week, previously multiple elevated values. Metformin recently decreased from 1000mg  BID to 500 mg BID, patient compliant. Has multiple questions on diet, will schedule follow up with diabetes education. Follow up ultrasound scheduled. Patient has eye doctor, will schedule appt. Fetal echo previously ordered but not scheduled, RN will call and scheduled.  5. Vitamin D deficiency -chronic problem, last check 06/19/20 was 29.8. Not currently taking supplementation, patient has prescription at home. Encouraged to restart.  6. History of anxiety/depression Not on meds, no history of SI/HI. Mood stable. Will schedule with BH.  7. Nausea and vomiting and pregnancy Only bothers her at night. Discussed eating dinner earlier and meal choices. Continue protonix and phenergan. Continue to monitor. Weight stable.  Preterm labor symptoms and general obstetric precautions including but not limited to vaginal bleeding, contractions, leaking of fluid and fetal movement were reviewed in detail with the patient. Please refer to After Visit Summary for other counseling recommendations.   Return in about 4 weeks (around 09/01/2020), or HOB; in person.  Future Appointments  Date Time Provider Department Center  09/01/2020 10:30 AM [facility] NURSE [facility]  09/01/2020 10:45 AM [id number]    09/03/2020, MD

## 2020-08-04 NOTE — Progress Notes (Addendum)
Called [facility], confirmed pt has not been scheduled for fetal echo. Front office notified to send referral records. Called pt with Eda to explain someone will call with appt. AFP results reviewed per patients request.   Fleet Contras RN 08/04/20

## 2020-08-04 NOTE — BH Specialist Note (Signed)
Integrated Behavioral Health via Telemedicine Video (Caregility) Visit  08/04/2020 Catherine Morris [id number]  Number of Integrated Behavioral Health visits: 1 Session Start time: 2:37  Session End time: 3:52 Total time: 15 minutes  Referring Provider: Mart Piggs, MD Type of Service: Individual Patient/Family location: Home [facility] Provider location: Center for [facility]  All persons participating in visit: Patient Catherine Morris and [facility] Catherine Morris    I connected with Catherine Morris and Spanish Interpreter [phone],  by a video enabled telemedicine application (Caregility) and verified that I am speaking with the correct person using two identifiers.   Discussed confidentiality: Yes   Confirmed demographics & insurance:  Yes   I discussed that engaging in this virtual visit, they consent to the provision of behavioral healthcare and the services will be billed under their insurance.   Patient and/or legal guardian expressed understanding and consented to virtual visit: Yes   PRESENTING CONCERNS: Patient and/or family reports the following symptoms/concerns: Pt states her primary concern today is experiencing depression in the past; goal is to healthy pregnancy/healthy baby.  Pt says she feels better not taking BH medication, and feels she is coping well at this time; no other questions or concerns.  Duration of problem: Current pregnancy; Severity of problem: mild  STRENGTHS (Protective Factors/Coping Skills): Open to treatment if needed in the future  ASSESSMENT: Patient currently experiencing At risk for depression.    GOALS ADDRESSED: Patient will: 1.  Maintain reduction of symptoms of: anxiety and depression  2.  Increase knowledge and/or ability of: healthy habits  3.  Demonstrate ability to: Increase healthy adjustment to current life circumstances and Increase adequate support systems  for patient/family   Progress of Goals: Ongoing  INTERVENTIONS: Interventions utilized:  Psychoeducation and/or Health Education and Link to [employer] Standardized Assessments completed & reviewed: PHQ9/GAD7 given in past two weeks   OUTCOME: Patient Response: Pt agrees to treatment plan   PLAN: 1. Follow up with behavioral health clinician on : One month 2. Behavioral recommendations:  -Continue taking prenatal vitamin daily -Sign up for MyChart today(code sent to email); Call MyChart help desk, if needed -Consider registering for and attending Perinatal Support group, "[facility]" (free, online) at [email or web address] -Consider reading through Postpartum Planner, on AVS within one month 3. Referral(s): Integrated Art gallery manager (In Clinic) and [employer]:  perinatal support  I discussed the assessment and treatment plan with the patient and/or parent/guardian. They were provided an opportunity to ask questions and all were answered. They agreed with the plan and demonstrated an understanding of the instructions.   They were advised to call back or seek an in-person evaluation as appropriate.  I discussed that the purpose of this visit is to provide behavioral health care while limiting exposure to the novel coronavirus.  Discussed there is a possibility of technology failure and discussed alternative modes of communication if that failure occurs.  Valetta Close [facility]   Depression screen [facility] 2/9 08/04/2020 07/15/2020 07/03/2020 06/25/2020 06/19/2020  Decreased Interest 0 0 1 1 1   Down, Depressed, Hopeless 0 0 0 0 0  PHQ - 2 Score 0 0 1 1 1   Altered sleeping 2 2 - 2 2  Tired, decreased energy 1 1 2 1 1   Change in appetite 1 1 1 1 1   Feeling bad or failure about yourself  0 0 0 0 0  Trouble concentrating 0 0 0 0 0  Moving slowly or fidgety/restless 0 0 0 0  0  Suicidal thoughts 0 0 0 0 0  PHQ-9 Score 4 4 - 5 5  Difficult doing work/chores - - - -  Not difficult at all   GAD 7 : Generalized Anxiety Score 08/04/2020 07/15/2020  Nervous, Anxious, on Edge 0 0  Control/stop worrying 0 0  Worry too much - different things 0 0  Trouble relaxing 0 1  Restless 0 0  Easily annoyed or irritable 0 1  Afraid - awful might happen 0 -  Total GAD 7 Score 0 -

## 2020-08-04 NOTE — Progress Notes (Signed)
       Fleet Contras RN 08/04/20

## 2020-08-04 NOTE — Addendum Note (Signed)
Addended by: Maxwell Marion E on: 08/04/2020 12:28 PM   Modules accepted: Orders

## 2020-08-11 ENCOUNTER — Other Ambulatory Visit: Payer: Self-pay

## 2020-08-11 ENCOUNTER — Ambulatory Visit: Payer: Self-pay | Admitting: Registered"

## 2020-08-11 ENCOUNTER — Encounter: Payer: Self-pay | Attending: Obstetrics and Gynecology | Admitting: Registered"

## 2020-08-11 DIAGNOSIS — O24111 Pre-existing diabetes mellitus, type 2, in pregnancy, first trimester: Secondary | ICD-10-CM | POA: Insufficient documentation

## 2020-08-11 DIAGNOSIS — E118 Type 2 diabetes mellitus with unspecified complications: Secondary | ICD-10-CM | POA: Insufficient documentation

## 2020-08-11 DIAGNOSIS — O0990 Supervision of high risk pregnancy, unspecified, unspecified trimester: Secondary | ICD-10-CM | POA: Insufficient documentation

## 2020-08-11 DIAGNOSIS — Z3A00 Weeks of gestation of pregnancy not specified: Secondary | ICD-10-CM | POA: Insufficient documentation

## 2020-08-11 NOTE — Progress Notes (Signed)
In-person Interpreter services provided by Raquel from [facility]  Patient was seen on 08/11/30 for follow-up assessment for Gestational Diabetes, unintentional weight loss, EDD 12/27/20, [redacted]w[redacted]d.   MD requested patient return for counseling due to elevated readings. Patient states her blood sugar was high after halloween because she was eating candy. Per Babyscripts last 2 weeks, Patient blood sugar has returned to mostly within range. However, patient does not check after breakfast because she is at work, so cannot be sure about those readings.   Pt states the vomiting continues but not as frequent, 2x last week in the evening. Still has to be careful what she eats also because of heartburn. Pt states reducing metformin did not make a difference. Pt states she is not taking phenergan as directed but only making a small difference.  Pt confirms she is taking metformin as directed 500 BID  Patient is slowly re-gaining lost weight and back to pre-pregnancy weight.  Wt Readings from Last 3 Encounters:  08/11/20 133 lb (60.3 kg)  08/04/20 131 lb 12.8 oz (59.8 kg)  07/14/20 131 lb 9.6 oz (59.7 kg)   07/09/20 129 lb 14.4 oz (58.9 kg)  07/02/20 129 lb 1.6 oz (58.6 kg)  06/19/20 131 lb 12.8 oz (59.8 kg)  05/08/20 133 lb (60.3 kg)    Plan:   Continue taking medication as directed  Continue to pay attention to your carb intake  Having a Glucerna or similar nutritional supplement may help you get nutrition when you don't feel like eating dinner.   Patient instructed to monitor glucose levels: FBS: 60 - 95 mg/dl 2 hour: <622 mg/dl  Patient received the following handouts: None  Patient will be seen for follow-up as needed.

## 2020-08-14 ENCOUNTER — Telehealth: Payer: Self-pay | Admitting: Clinical

## 2020-08-14 NOTE — Telephone Encounter (Signed)
Intern called pt utilizing Interpreter [employer] [phone]. The phone gave an error and was not able to accept calls.  Bea Graff (Supervisor: Hulda Marin)

## 2020-08-18 ENCOUNTER — Ambulatory Visit: Payer: Self-pay | Admitting: Clinical

## 2020-08-18 DIAGNOSIS — Z8659 Personal history of other mental and behavioral disorders: Secondary | ICD-10-CM

## 2020-08-18 NOTE — Patient Instructions (Addendum)
[facility] for [facility] [address] [place], [place] [address] [phone] (main office) [phone] (Catherine Morris's office)  Catherine Morris Perinatal [email or web address]     [facility] de mujeres  Plan posparto para la familia   Un plan de juego realista para ayudarnos a adaptarnos a la vida con un nuevo beb.     REUNIN CREATIVA Desarrolla un plan Metas: . Proporcione una forma de comenzar una conversacin sobre su nueva vida con un beb . Ayudar a los padres a Public house manager y [employer] recursos a Designer, fashion/clothing . Ayuda a allanar el camino antes del nacimiento para un perodo de transicin ms fcil despus.  Haga una lista de la siguiente informacin para Pharmacologist en una ubicacin central: . Nombre completo de mam y compaero: ______________________ . Nombre completo y fecha de nacimiento del beb: ________________ .Direccion de casa: __________________________ .Telfono de casa: _________________________ . Nmeros de clulas de los padres: _____________________ . Nombre e informacin de contacto para OB: ____________________ . Nombre e informacin de contacto del pediatra: _______________________________ . Informacin de contacto para Consultores de Lactancia: ________________________  Catherine Morris DUERME * Cada uno necesita al menos 4-5 horas de sueo ininterrumpido todos los [place]. Escriba nombres especficos e informacin de contacto. * . Cmo vas a descansar en el perodo posparto? Mientras el compaero est en casa? Cuando el compaero vuelve al Aleen Campi? Cuando ambos vuelven al Aleen Campi? Catherine Morris dormir tu beb?  Catherine Morris est disponible para ayudar durante el da? Noche? Noche? Catherine Morris podra mudarse por un perodo para ayudarlo?  Catherine Morris son algunas ideas para ayudarlo a dormir lo  suficiente? ________________________________________________________________________________________________________________________________________________________________________________________________________________________________________________________________________________________________________________________  Catherine Morris * Planifique las comidas antes de que nazca su beb para que pueda comer alimentos saludables durante el perodo de posparto inmediato. * Catherine Morris se ocupar del desayuno? Almuerzo? Cena? Lista de nombres e informacin de contacto. Haga una lluvia de ideas rpidas y saludables para cada comida. Catherine Morris puede hacer antes de que nazca el beb para preparar las comidas para el perodo de posparto? . Cmo pueden otros ayudarte con las comidas? Catherine Morris supermercados ofrecen compras y entregas en lnea? Catherine Morris restaurantes ofrecen opciones de comida para llevar o para llevar? ______________________________________________________________________________________________________________________________________________________________________________________________________________________________________________________________________________________________________________________________________________________________________________________________________  CUIDADO CON LA MAM * Es importante que mam sea cuidada y Catherine Morris en el perodo de posparto. Recuerde, las formas ms importantes en que las nuevas madres necesitan atencin son: sueo, nutricin, ejercicio suave y [place]. Catherine Morris puede venir a cuidar a mam durante este perodo? Haga una lista de personas con su informacin de contacto. Catherine Morris actividades que lo hagan sentir protegido, descansado y con Engineer, drilling. Quin puede asegurarse de tener oportunidades para hacer estas cosas? Catherine Morris mam un espacio propio dentro de su hogar que sea solo para ella? Haga una "cueva de mam"  donde pueda sentirse cmoda, descansar y renovarse a s misma a diario. ______________________________________________________________________________________________________________________________________________________________________________________________________________________________________________________________________________________________________________________________________________________________________________________________________  Catherine Morris ALIMENTACIN DEL BEB * Las personas informadas y Manufacturing engineer ofrecern el mejor apoyo con respecto a la alimentacin de su beb. Catherine Morris y elija la mejor opcin de alimentacin para su beb. Catherine Morris lista de las personas que lo guiarn, apoyarn y sern un recurso para usted a medida que cuida y [place] a su beb. (Amigos que han amamantado o estn amamantando actualmente, consultores de [place], grupos de apoyo para la lactancia, etc.) . Considera una doula de posparto. (Estos sitios web pueden brindarle informacin: dona.org y  https://shea.org/) . Busque recursos locales de Tour manager, como el grupo de apoyo a Patent examiner en Women's o [employer]. ________________________________________________________________________________________________________________________________________________________________________________________________________________________________________________________________________________________________________________________  Catherine Morris ERRANDS . Quin puede ayudar con una limpieza profunda antes de que nazca el beb? Catherine Morris Kitchen Haga una lista de las personas que lo ayudarn con las tareas domsticas y las tareas domsticas, como lavandera, limpieza ligera, platos, baos, etc. . Quin puede hacerte algunos mandados? Catherine Morris puedes hacer para asegurarte de tener provisiones bsicas antes de que nazca el beb? Catherine Morris har las  compras? __________________________________________________________________________________________________________________________________________________________________________________________________________________________________________ Catherine Morris * Nutrirse ... ayuda a los padres a ser ms amorosos y permite una mejor unin con sus hijos. Catherine Morris tipo de cosas disfrutan t y tu pareja juntos? Qu actividades te ayudan a conectarte y Chief Operating Officer tu relacin? Haga una lista de esas cosas. Haga una lista de las personas en quienes confa para que cuiden a su beb para que puedan pasar un tiempo juntos como pareja. Catherine Morris tipos de cosas ayudan a la pareja a sentirse conectada con mam? Hacer una lista. Catherine Morris necesidades tendr el compaero para vincularse con el beb? Catherine Morris KitchenOtros nios? Quin los cuidar cuando entre a Printmaker y [place] est en el hospital? . Eula Flax cules podran ser las necesidades de tus hijos Hunter. Quin puede ayudarlo a satisfacer esas necesidades? De qu manera los est ayudando a prepararse para llevar a su beb a casa? Enumere algunas estrategias especficas que tiene para Press photographer. ______________________________________________________________________________________________________________________________________________________________________________________________________________________________________________________________________________________________________________________________________________________________________________________________________  Andee Poles * Alguien que puede empatizar con las experiencias normaliza tus problemas y los hace ms llevaderos. * . Catherine Morris lista de otros amigos, vecinos y / o compaeros de trabajo que conozca con bebs (y nios pequeos, si corresponde) con quienes puede conectarse. Catherine Morris lista de grupos de apoyo locales o en lnea, grupos de [place], Catering manager. en los que Actor. __________________________________________________________________________________________________________________________________________________________________________________________________________________________________________     PLANES DE CUIDADO DE NIOS . Investigar y Marine scientist el cuidado de los nios si la mam regresa al [place]. Iona Hansen sobre las preocupaciones de mam sobre su transicin al Aleen Campi. Iona Hansen sobre las preocupaciones de los socios con respecto a esta transicin.  SALUD MENTAL * Su salud mental es una de las principales prioridades para una madre embarazada o posparto.  . 1 de cada 5 mujeres experimenta ansiedad y / o depresin desde el momento de la concepcin Regulatory affairs officer ao despus del nacimiento. . Los trastornos del estado de nimo posparto son la complicacin n.  1 del embarazo y [place], y el sufrimiento experimentado por estas madres no es necesario! Estas enfermedades son temporales y responden bien al tratamiento, que a menudo incluye autocuidado, apoyo social, terapia de conversacin y medicamentos cuando sea necesario. . Las mujeres que experimentan ansiedad y depresin a menudo dicen cosas como: "Se supone que soy feliz ... por qu me siento tan triste?", "Por qu no puedo salir de eso?", "Tengo pensamientos que Leonette Nutting" yo." . No hay necesidad de avergonzarse si siente estos sntomas: . Abrumado, ansioso, enojado, triste, culpable, irritable, desesperado, agotado pero no puede dormer No estas solo. Usted no tiene Counsellor. Con ayuda, estars bien. . Dnde puedo encontrar ayuda? Profesionales mdicos como su obstetra, partera, gineclogo, Pharmacist, community, proveedor de atencin primaria, pediatra o proveedores de salud mental; Grupos de apoyo de [facility]: Feelings After Birth, Georges Mouse de apoyo para la lactancia materna, Baby [employer], y [employer] clases de ejercicios. Richard Miu permiso para pedir ayuda.  Confirmar sus  sentimientos, validar sus experiencias, compartir / aprender estrategias de afrontamiento y obtendr apoyo y aliento a medida que se recupere. Eres importante!  IDEA GENIAL . Haga una lista de recursos locales, [employer] recursos para mam y para Insurance risk surveyor. . Identificar grupos de apoyo. . Identifique a las personas para llamar a altas horas de la noche: incluya nombres e informacin de contacto. Iona Hansen con su pareja sobre el estado de nimo perinatal y los trastornos de ansiedad. Iona Hansen con su obstetra, partera y doula sobre el baby blues y [place] de nimo perinatal y los trastornos de ansiedad. Iona Hansen con su pediatra sobre el estado de nimo perinatal y los trastornos de ansiedad.

## 2020-08-26 ENCOUNTER — Telehealth: Payer: Self-pay | Admitting: Lactation Services

## 2020-08-26 NOTE — Telephone Encounter (Signed)
Called patient with assistance of International Paper, Research officer, trade union. She reports that she is having difficulty with the [employer]. She is recording on a piece of paper and will bring to next appointment. Patient with no questions or concerns.

## 2020-08-26 NOTE — Telephone Encounter (Signed)
-----   Message from Adam Phenix, MD sent at 08/21/2020  2:11 PM EST ----- We have less than 21 values for this patient with diabetes over the last week, and would love to see 3-4 per day. Please have her called by interpreter to see if she is having issues with babyscripts or testing BG in general

## 2020-09-01 ENCOUNTER — Other Ambulatory Visit: Payer: Self-pay | Admitting: *Deleted

## 2020-09-01 ENCOUNTER — Ambulatory Visit: Payer: Self-pay | Admitting: *Deleted

## 2020-09-01 ENCOUNTER — Other Ambulatory Visit: Payer: Self-pay

## 2020-09-01 ENCOUNTER — Ambulatory Visit (INDEPENDENT_AMBULATORY_CARE_PROVIDER_SITE_OTHER): Payer: Self-pay | Admitting: Family Medicine

## 2020-09-01 ENCOUNTER — Encounter: Payer: Self-pay | Admitting: *Deleted

## 2020-09-01 ENCOUNTER — Encounter: Payer: Self-pay | Admitting: Family Medicine

## 2020-09-01 ENCOUNTER — Ambulatory Visit: Payer: Self-pay | Attending: Obstetrics and Gynecology

## 2020-09-01 VITALS — BP 95/65 | HR 86 | Wt 134.2 lb

## 2020-09-01 DIAGNOSIS — O2692 Pregnancy related conditions, unspecified, second trimester: Secondary | ICD-10-CM

## 2020-09-01 DIAGNOSIS — O24112 Pre-existing diabetes mellitus, type 2, in pregnancy, second trimester: Secondary | ICD-10-CM | POA: Insufficient documentation

## 2020-09-01 DIAGNOSIS — O4592 Premature separation of placenta, unspecified, second trimester: Secondary | ICD-10-CM

## 2020-09-01 DIAGNOSIS — Z8669 Personal history of other diseases of the nervous system and sense organs: Secondary | ICD-10-CM

## 2020-09-01 DIAGNOSIS — Z789 Other specified health status: Secondary | ICD-10-CM

## 2020-09-01 DIAGNOSIS — O09212 Supervision of pregnancy with history of pre-term labor, second trimester: Secondary | ICD-10-CM

## 2020-09-01 DIAGNOSIS — O09522 Supervision of elderly multigravida, second trimester: Secondary | ICD-10-CM

## 2020-09-01 DIAGNOSIS — Z362 Encounter for other antenatal screening follow-up: Secondary | ICD-10-CM

## 2020-09-01 DIAGNOSIS — O0990 Supervision of high risk pregnancy, unspecified, unspecified trimester: Secondary | ICD-10-CM

## 2020-09-01 DIAGNOSIS — O24111 Pre-existing diabetes mellitus, type 2, in pregnancy, first trimester: Secondary | ICD-10-CM

## 2020-09-01 DIAGNOSIS — Z3A23 23 weeks gestation of pregnancy: Secondary | ICD-10-CM

## 2020-09-01 DIAGNOSIS — Z23 Encounter for immunization: Secondary | ICD-10-CM

## 2020-09-01 NOTE — Progress Notes (Signed)
   Subjective:  Catherine Morris is a 39 y.o. G3P1102 at [redacted]w[redacted]d being seen today for ongoing prenatal care.  She is currently monitored for the following issues for this high-risk pregnancy and has Anxiety and depression; Chronic headache; Bilateral knee pain; Intractable chronic migraine without aura and without status migrainosus; Vitamin D deficiency; Supervision of high risk pregnancy, antepartum; Language barrier; AMA (advanced maternal age) multigravida 35+; History of migraine; Gastroesophageal reflux disease; and Pre-existing type 2 diabetes mellitus during pregnancy in first trimester on their problem list.  Patient reports no complaints.  Contractions: Not present. Vag. Bleeding: None.  Movement: Present. Denies leaking of fluid.   The following portions of the patient's history were reviewed and updated as appropriate: allergies, current medications, past family history, past medical history, past social history, past surgical history and problem list. Problem list updated.  Objective:   Vitals:   09/01/20 0955  BP: 95/65  Pulse: 86  Weight: 134 lb 3.2 oz (60.9 kg)    Fetal Status: Fetal Heart Rate (bpm): 152   Movement: Present     General:  Alert, oriented and cooperative. Patient is in no acute distress.  Skin: Skin is warm and dry. No rash noted.   Cardiovascular: Normal heart rate noted  Respiratory: Normal respiratory effort, no problems with respiration noted  Abdomen: Soft, gravid, appropriate for gestational age. Pain/Pressure: Absent     Pelvic: Vag. Bleeding: None     Cervical exam deferred        Extremities: Normal range of motion.  Edema: None  Mental Status: Normal mood and affect. Normal behavior. Normal judgment and thought content.   Urinalysis:      Assessment and Plan:  Pregnancy: G3P1102 at [redacted]w[redacted]d  1. Supervision of high risk pregnancy, antepartum BP and FHR normal  2. Language barrier Provider approved to conduct visit in Spanish  3.  History of migraine Reports no recent headaches  4. Multigravida of advanced maternal age in second trimester   5. Pre-existing type 2 diabetes mellitus during pregnancy in first trimester On metformin 500mg  BID Brings picture of sugar log from home for the past week, well controlled with only a few post prandials in the 120's Following w MFM, has f/u growth scheduled today, last one on 08/03/2020 was normal but with limited views Had fetal echo on 08/25/2020 which was unremarkable Reports she has had an eye exam within the past year, thinks it was in May  Preterm labor symptoms and general obstetric precautions including but not limited to vaginal bleeding, contractions, leaking of fluid and fetal movement were reviewed in detail with the patient. Please refer to After Visit Summary for other counseling recommendations.  Return in 4 weeks (on 09/29/2020) for [facility], ob visit.   [facility], MD

## 2020-09-01 NOTE — Addendum Note (Signed)
Addended by: Maxwell Marion E on: 09/01/2020 11:24 AM   Modules accepted: Orders

## 2020-09-01 NOTE — Patient Instructions (Signed)
Segundo trimestre de [employer] Trimester of Pregnancy El segundo trimestre va desde la semana14 hasta la 39, desde el cuarto hasta el sexto mes, y suele ser el momento en el que mejor se siente. Su organismo se ha adaptado a Public relations account executive, y comienza a Print production planner. En general, las nuseas matutinas han disminuido o han desaparecido completamente, puede tener ms energa y un aumento de apetito. El segundo trimestre es tambin la poca en la que el feto se desarrolla rpidamente. Hacia el final del sexto mes, el feto mide aproximadamente 9pulgadas (23cm) y pesa alrededor de 1 libras (700g). Es probable que sienta que el beb se Software engineer (da pataditas) entre las 19 y [place]. Cambios en el cuerpo durante el segundo trimestre Su cuerpo continua experimentando numerosos cambios durante su segundo trimestre. Estos cambios varan de [place] a [place].  Seguir American Family Insurance. Notar que la parte baja del abdomen sobresale.  Podrn aparecer las primeras Apache Corporation caderas, el abdomen y las [place].  Es posible que tenga dolores de cabeza que pueden aliviarse con ciertos medicamentos. Los medicamentos que tome deben estar aprobados por el mdico.  Tal vez tenga necesidad de orinar con ms frecuencia porque el feto est ejerciendo presin sobre la vejiga.  Debido al Glennis Brink podr sentir Victorio Palm estomacal con frecuencia.  Puede estar estreida, ya que ciertas hormonas enlentecen los movimientos de los msculos que J[employer] Co desechos a travs de los intestinos.  Pueden aparecer hemorroides o abultarse e hincharse las venas (venas varicosas).  Puede sentir dolor en la espalda. Esto se debe a: ? Aumento de peso. ? Las hormonas del [place] articulaciones en la pelvis. ? Un cambio en el peso y los msculos que ayudan a Theatre manager su equilibrio.  Sus pechos seguirn creciendo y se pondrn cada vez ms sensibles.  Las Production manager y  estar sensibles al cepillado y al hilo dental.  Pueden aparecer zonas oscuras o manchas (cloasma, mscara del [place]) en el rostro. Esto probablemente se atenuar despus del nacimiento del beb.  Es posible que se forme una lnea oscura desde el ombligo hasta la zona del pubis (linea nigra). Esto probablemente se atenuar despus del nacimiento del beb.  Tal vez haya cambios en el cabello. Esto cambios pueden incluir su engrosamiento, crecimiento rpido y Harley-Davidson textura. Adems, a algunas mujeres se les cae el cabello durante o despus del embarazo, o tienen el cabello seco o fino. Lo ms probable es que el cabello se le normalice despus del nacimiento del beb. Qu debe esperar en las visitas prenatales Durante una visita prenatal de rutina:  La pesarn para asegurarse de que usted y el feto estn creciendo normalmente.  Le tomarn la presin arterial.  Le medirn el abdomen para controlar el desarrollo del beb.  Se escucharn los latidos cardacos fetales.  Se evaluarn los resultados de los estudios solicitados en visitas anteriores. El mdico puede preguntarle lo siguiente:  Cmo se siente.  Si siente los movimientos del beb.  Si ha tenido sntomas anormales, como prdida de lquido, [place], dolores de cabeza intensos o clicos abdominales.  Si est consumiendo algn producto que contenga tabaco, como cigarrillos, tabaco de Higher education careers adviser y Psychologist, sport and exercise.  Si tiene [employer]oco. Otros estudios que podrn realizarse durante el segundo trimestre incluyen lo siguiente:  Anlisis de sangre para detectar lo siguiente: ? Concentraciones de hierro bajas (anemia). ? Nivel alto de azcar en la sangre que afecta a las mujeres embarazadas (  diabetes gestacional) entre las semanas 24 y 6928. ? Anticuerpos Rh. Esto es para detectar una protena en los glbulos rojos (factor Rh).  Anlisis de orina para detectar infecciones, diabetes o protenas en la orina.  Una  ecografa para confirmar que el beb crece y se desarrolla correctamente.  Una amniocentesis para diagnosticar posibles problemas genticos.  Estudios del feto para descartar espina bfida y sndrome de [place].  Prueba del VIH (virus de inmunodeficiencia humana). Los exmenes prenatales de rutina incluyen la prueba de deteccin del VIH, a menos que decida no Futures traderrealizrsela. Siga estas indicaciones en su casa: Medicamentos  Siga las indicaciones del mdico en relacin con el uso de medicamentos. Durante el embarazo, hay medicamentos que pueden tomarse y otros que no.  Tome vitaminas prenatales que contengan por lo menos 600microgramos (?g) de cido flico.  Si est estreida, tome un laxante suave, si el mdico lo autoriza. Qu debe comer y beber   Meriel FlavorsLleve una dieta equilibrada que incluya gran cantidad de frutas y verduras frescas, cereales integrales, buenas fuentes de protenas como carnes Greenfieldmagras, huevos o tofu, y lcteos descremados. El mdico la ayudar a Production assistant, radiodeterminar la cantidad de peso que puede Belknapaumentar.  No coma carne cruda ni quesos sin cocinar. Estos elementos contienen grmenes que pueden causar defectos congnitos en el beb.  Si no consume muchos alimentos con calcio, hable con su mdico sobre si debera tomar un suplemento diario de calcio.  Limite el consumo de alimentos con alto contenido de grasas y azcares procesados, como alimentos fritos o dulces.  Para evitar el estreimiento: ? Bebe suficiente lquido para mantener la orina clara o de color amarillo plido. ? Consuma alimentos ricos en fibra, como frutas y verduras frescas, cereales integrales y frijoles. Actividad  Haga ejercicio solamente como se lo haya indicado el mdico. La mayora de las mujeres pueden continuar su rutina de ejercicios durante el [place] Sant[place] Villageembarazo. Intente realizar como mnimo 30minutos de actividad fsica por lo menos 5das a la semana. Deje de hacer ejercicio si experimenta contracciones  uterinas.  No levante objetos pesados, use zapatos de tacones bajos y [address] Boulevardmantenga una buena postura.  Puede seguir Calpine Corporationmanteniendo relaciones sexuales, a menos que el mdico le indique lo contrario. Alivio del dolor y del Dentistmalestar  Use un sostn que le brinde buen soporte para prevenir las molestias causadas por la sensibilidad en los pechos.  Dese baos de asiento con agua tibia para Engineer, materialsaliviar el dolor o las molestias causadas por las hemorroides. Use una crema para las hemorroides si el mdico la autoriza.  Descanse con las piernas elevadas si tiene calambres o dolor de cintura.  Si tiene venas varicosas, use medias de descanso. Eleve los pies durante 15minutos, 3 o 4veces por da. Limite el consumo de sal en su dieta. Cuidados prenatales  Escriba sus preguntas. Llvelas cuando concurra a las visitas prenatales.  Concurra a todas las visitas prenatales tal como se lo haya indicado el mdico. Esto es importante. Seguridad  Use el cinturn de seguridad en todo momento mientras conduce.  Haga una lista de los nmeros de telfono de Associate Professoremergencia, que W. R. Berkleyincluya los nmeros de telfono de familiares, McCallaamigos, el hospital y los departamentos de polica y bomberos. Instrucciones generales  Pdale al mdico que la derive a clases de educacin prenatal en su localidad. Debe comenzar a tomar las clases antes de que empiece el mes6 de Middle Valleyembarazo.  Pida ayuda si tiene necesidades nutricionales o de asesoramiento Academic librariandurante el embarazo. El mdico puede aconsejarla o derivarla a especialistas para que  la ayuden con diferentes necesidades.  No se d baos de inmersin en agua caliente, baos turcos ni saunas.  No se haga duchas vaginales ni use tampones o toallas higinicas perfumadas.  No mantenga las piernas cruzadas durante [place].  Evite el contacto con las bandejas sanitarias de los gatos y la tierra que estos animales usan. Estos elementos contienen bacterias que pueden causar defectos congnitos  al beb y la posible prdida del feto debido a un aborto espontneo o muerte fetal.  Evite fumar, consumir hierbas, beber alcohol y tomar frmacos que no le hayan recetado. Las sustancias qumicas que estos productos contienen pueden afectar la formacin y el desarrollo del beb.  No consuma ningn producto que contenga nicotina o tabaco, como cigarrillos y Administrator, Civil Service. Si necesita ayuda para dejar de fumar, consulte al American Express.  Visite a su dentista si an no lo ha Occupational hygienist. Use un cepillo de dientes blando para higienizarse los dientes y psese el hilo dental con suavidad. Comunquese con un mdico si:  Tiene mareos.  Siente clicos leves, presin en la pelvis o dolor persistente en el abdomen.  Tiene nuseas, vmitos o diarrea persistentes.  Brett Fairy secrecin vaginal con mal olor.  Siente dolor al ConocoPhillips. Solicite ayuda de inmediato si:  Tiene fiebre.  Tiene una prdida de lquido por la vagina.  Tiene sangrado o pequeas prdidas vaginales.  Siente dolor intenso o clicos en el abdomen.  Sube de peso o baja de peso rpidamente.  Tiene dificultad para respirar y siente dolor de pecho.  Sbitamente se le hinchan mucho el rostro, las [place], los tobillos, los pies o las piernas.  No ha sentido los movimientos del beb durante Georgianne Fick.  Siente un dolor de cabeza intenso que no se alivia al tomar [employer] Par[employer].  Nota cambios en la visin. Resumen  El segundo trimestre va desde la semana14 hasta la 27, desde el cuarto hasta el sexto mes. Es tambin una poca en la que el feto se desarrolla rpidamente.  Su organismo atraviesa por muchos cambios durante el [place]. Estos cambios varan de [place] a [place].  Evite fumar, consumir hierbas, beber alcohol y tomar frmacos que no le hayan recetado. Estas sustancias qumicas afectan la formacin y el desarrollo de su beb.  No consuma ningn producto que contenga tabaco, lo que incluye  cigarrillos, tabaco de Theatre manager y Administrator, Civil Service. Si necesita ayuda para dejar de fumar, consulte al mdico.  Comunquese con su mdico si tiene preguntas sobre esto. Concurra a todas las visitas prenatales tal como se lo haya indicado el mdico. Esto es importante. Esta informacin no tiene Theme park manager el consejo del mdico. Asegrese de hacerle al mdico cualquier pregunta que tenga. Document Revised: 01/09/2017 Document Reviewed: 01/09/2017 Elsevier Patient Education  2020 Elsevier Inc.   [employer] del mtodo anticonceptivo Contraception Choices La anticoncepcin, o los mtodos anticonceptivos, hace referencia a los mtodos o dispositivos que evitan el [place]. Mtodos hormonales Implante anticonceptivo  Un implante anticonceptivo consiste en un tubo plstico delgado que contiene una hormona. Se inserta en la parte superior del brazo. Puede Consulting civil engineer por 3 aos. Inyecciones de progestina sola Las inyecciones de progestina sola contienen progestina, una forma sinttica de la hormona progesterona. Un mdico las administra cada 3 meses. Pldoras anticonceptivas  Las pldoras anticonceptivas son pastillas que contienen hormonas que evitan el [place]. Deben tomarse una vez al da, preferentemente a la misma Economist. Parches anticonceptivos  El parche anticonceptivo contiene hormonas  que evitan el embarazo. Se coloca en la piel, debe cambiarse una vez a la semana durante tres semanas y debe retirarse en la cuarta semana. Se necesita una receta para utilizar este mtodo anticonceptivo. Anillo vaginal  Un anillo vaginal contiene hormonas que evitan el embarazo. Se coloca en la vagina durante tres semanas y se retira en la cuarta semana. Luego se repite el proceso con un anillo nuevo. Se necesita una receta para utilizar este mtodo anticonceptivo. Anticonceptivo de emergencia Los anticonceptivos de emergencia son mtodos para evitar un embarazo despus  de Warehouse manager sexo sin proteccin. Vienen en forma de pldora y pueden tomarse hasta 5 das despus de [place]. Funcionan mejor cuando se toman lo ms pronto posible luego de eBay. La mayora de los anticonceptivos de emergencia estn disponibles sin receta mdica. Este mtodo no debe utilizarse como el nico mtodo anticonceptivo. Mtodos de barrera Preservativo masculino  Un preservativo masculino es una vaina delgada que se coloca sobre el pene durante el sexo. Los preservativos evitan que el esperma ingrese en el cuerpo de la [place]. Pueden utilizarse con un espermicida para aumentar la efectividad. Deben desecharse luego de su uso. Preservativo femenino  Un preservativo femenino es una vaina blanda y holgada que se coloca en la vagina antes de [place]. El preservativo evita que el esperma ingrese en el cuerpo de la [place]. Deben desecharse luego de su uso. Diafragma  Un diafragma es una barrera blanda con forma de cpula. Se inserta en la vagina antes del sexo, junto con un espermicida. El diafragma bloquea el ingreso de esperma en el tero, y el espermicida mata a los espermatozoides. El Designer, fashion/clothing en la vagina durante 6 a 8 horas despus de Warehouse manager sexo y debe retirarse en el plazo de las 24 horas. Un diafragma es recetado y colocado por un mdico. Debe reemplazarse cada 1 a 2 aos, despus de dar a luz, de aumentar ms de 15lb (6,8kg) y de [place] plvica. Capuchn cervical  Un capuchn cervical es una copa redonda y blanda de ltex o plstico que se coloca en el cuello uterino. Se inserta en la vagina antes del sexo, junto con un espermicida. Bloquea el ingreso del esperma en el tero. El capuchn Radio producer durante 6 a 8 horas despus de Warehouse manager sexo y debe retirarse en el plazo de las 48 horas. Un capuchn cervical debe ser recetado y colocado por un mdico. Debe reemplazarse cada 2aos. Esponja  Una esponja es una pieza blanda y circular de espuma  de poliuretano que contiene espermicida. La esponja ayuda a bloquear el ingreso de esperma en el tero, y el espermicida mata a los espermatozoides. Belva Bertin, debe humedecerla e insertarla en la vagina. Debe insertarse antes de eBay, debe permanecer dentro al menos durante 6 horas despus de tener sexo y debe retirarse y Nurse, adult en el plazo de las 30 horas. Espermicidas Los espermicidas son sustancias qumicas que matan o bloquean al esperma y no lo dejan ingresar al cuello uterino y al tero. Vienen en forma de crema, gel, supositorio, espuma o comprimido. Un espermicida debe insertarse en la vagina con un aplicador al menos 10 o 15 minutos antes de tener sexo para dar tiempo a que surta [place]. El proceso debe repetirse cada vez que tenga sexo. Los espermicidas no requieren Emergency planning/management officer. Anticonceptivos intrauterinos Dispositivo intrauterino (DIU). Un DIU es un dispositivo en forma de T que se coloca en el tero. [employer] Life Insurance tipos:  DIU  hormonal.Este tipo contiene progestina, una forma sinttica de la hormona progesterona. Este tipo puede permanecer colocado durante 3 a 5 aos.  DIU de cobre.Este tipo est recubierto con un alambre de cobre. Puede permanecer colocado durante 10 aos.  Mtodos anticonceptivos permanentes Ligadura de trompas en la mujer En este mtodo, se sellan, atan u obstruyen las trompas de [place]io durante una ciruga para Automotive engineer que el vulo descienda [place]. Esterilizacin histeroscpica En este mtodo, se coloca un implante pequeo y flexible dentro de cada trompa de Falopio. Los implantes hacen que se forme un tejido cicatricial en las trompas de Falopio y que las obstruya para que el espermatozoide no pueda llegar al vulo. El procedimiento demora alrededor de 3 meses para que sea [place]. Debe utilizarse otro mtodo anticonceptivo durante esos 3 meses. Esterilizacin masculina Este es un procedimiento que consiste en atar los conductos que  transportan el esperma (vasectoma). Luego del procedimiento, el hombre Manufacturing engineer lquido (semen). Mtodos de planificacin natural Planificacin familiar natural En este mtodo, la pareja no tiene American Family Insurance la mujer podra quedar [place]. Mtodo calendario Marketing executive un seguimiento de la duracin de cada ciclo menstrual, identificar los [employer] que se puede producir un Psychiatrist y no Warehouse manager sexo durante esos [place]. Mtodo de la ovulacin En este mtodo, la pareja evita tener sexo durante la ovulacin. Mtodo sintotrmico Este mtodo implica no tener sexo durante la ovulacin. Normalmente, la mujer comprueba la ovulacin al observar cambios en su temperatura y en la consistencia del moco cervical. Mtodo posovulacin En este mtodo, la pareja espera a que finalice la ovulacin para Doctor, hospital. Resumen  La anticoncepcin, o los mtodos anticonceptivos, hace referencia a los mtodos o dispositivos que evitan el [place].  Los mtodos anticonceptivos hormonales incluyen implantes, inyecciones, pastillas, parches, anillos vaginales y anticonceptivos de Associate Professor.  Los mtodos anticonceptivos de barrera pueden incluir preservativos masculinos, preservativos femeninos, diafragmas, capuchones cervicales, esponjas y espermicidas.  Guardian Life Insurance tipos de DIU (dispositivo intrauterino). Un DIU puede colocarse en el tero de una mujer para evitar el embarazo durante 3 a 5 aos.  La esterilizacin permanente puede realizarse mediante un procedimiento para hombres, mujeres o ambos.  Los [employer] de Medical sales representative natural incluyen no tener American Family Insurance la mujer podra quedar [place]. Esta informacin no tiene Theme park manager el consejo del mdico. Asegrese de hacerle al mdico cualquier pregunta que tenga. Document Revised: 09/30/2017 Document Reviewed: 12/19/2016 Elsevier Patient Education  2020 Elsevier Inc.   [place]  materna Breastfeeding  Decidir amamantar es una de las mejores elecciones que puede hacer por usted y su beb. Un cambio en las hormonas durante el embarazo hace que las mamas produzcan leche materna en las glndulas productoras de [place]. Las hormonas impiden que la leche materna sea liberada antes del nacimiento del beb. Adems, impulsan el flujo de leche luego del nacimiento. Una vez que ha comenzado a Museum/gallery exhibitions officer, Conservation officer, nature beb, as Immunologist succin o Theatre manager, pueden estimular la liberacin de [place] de las glndulas productoras de [place]. Los beneficios de [employer] International investigaciones demuestran que la lactancia materna ofrece muchos beneficios de salud para bebs y [place]. Adems, ofrece una forma gratuita y conveniente de Corporate treasurer al beb. Para el beb  La primera leche (calostro) ayuda a Careers information officer funcionamiento del aparato digestivo del beb.  Las clulas especiales de la leche (anticuerpos) ayudan a Artist las infecciones en el beb.  Los bebs que se alimentan con  leche materna tambin tienen menos probabilidades de tener asma, alergias, obesidad o diabetes de tipo 2. Adems, tienen menor riesgo de sufrir el sndrome de muerte sbita del lactante (SMSL).  Los nutrientes de la [place] materna son mejores para Patent examiner las necesidades del beb en comparacin con la [employer]S Inc.  La leche materna mejora el desarrollo cerebral del beb. Para usted  La lactancia materna favorece el desarrollo de un vnculo muy especial entre la madre y el beb.  Es conveniente. La leche materna es econmica y siempre est disponible a la Human resources officer.  La lactancia materna ayuda a quemar caloras. Claude Manges a perder el peso ganado durante el [place].  Hace que el tero vuelva al tamao que tena antes del embarazo ms rpido. Adems, disminuye el sangrado (loquios) despus del parto.  La lactancia materna contribuye a reducir Nurse, adult de tener diabetes de tipo 2, osteoporosis,  artritis reumatoide, enfermedades cardiovasculares y cncer de mama, ovario, tero y endometrio en el futuro. Informacin bsica sobre la lactancia Comienzo de la lactancia  Encuentre un lugar cmodo para sentarse o Teacher, music, con un buen respaldo para el cuello y la espalda.  Coloque una almohada o una manta enrollada debajo del beb para acomodarlo a la altura de la mama (si est sentada). Las almohadas para Museum/gallery exhibitions officer se han diseado especialmente a fin de servir de apoyo para los brazos y el beb [employer].  Asegrese de que la barriga del beb (abdomen) est frente a la suya.  [place] suavemente la mama. Con las yemas de los dedos, Liberty Media bordes exteriores de la mama hacia adentro, en direccin al pezn. Esto estimula el flujo de [place]. Si la [employer]ot, es posible que deba Educational psychologist con este movimiento durante la Market researcher.  Sostenga la mama con 4 dedos por debajo y Multimedia programmer por arriba del pezn (forme la letra C con la mano). Asegrese de que los dedos se encuentren lejos del pezn y de la boca del beb.  Empuje suavemente los labios del beb con el pezn o con el dedo.  Cuando la boca del beb se abra lo suficiente, acrquelo rpidamente a la mama e introduzca todo el pezn y la arola, tanto como sea posible, dentro de la boca del beb. La arola es la zona de color que rodea al pezn. ? Debe haber ms arola visible por arriba del labio superior del beb que por debajo del labio inferior. ? Los labios del beb deben estar abiertos y extendidos hacia afuera (evertidos) para asegurar que el beb se prenda de forma adecuada y cmoda. ? La lengua del beb debe estar entre la enca inferior y Educational psychologist.  Asegrese de que la boca del beb est en la posicin correcta alrededor del pezn (prendido). Los labios del beb deben crear un sello sobre la mama y estar doblados hacia afuera (invertidos).  Es comn que el beb succione durante 2 a 3 minutos para que comience  el flujo de [place]. Cmo debe prenderse Es muy importante que le ensee al beb cmo prenderse adecuadamente a la mama. Si el beb no se prende adecuadamente, puede causar [employer] Stores, reducir la produccin de [place] materna y Radio producer que el beb tenga un escaso aumento de [place]. Adems, si el beb no se prende adecuadamente al pezn, puede tragar aire durante la alimentacin. Esto puede causarle molestias al beb. Hacer eructar al beb al Pilar Plate de mama puede ayudarlo a liberar el aire. Sin embargo, ensearle al beb  cmo prenderse a la mama adecuadamente es la mejor manera de evitar que se sienta molesto por tragar Aretta Nip se alimenta. Signos de que el beb se ha prendido adecuadamente al pezn  Tironea o succiona de modo silencioso, sin Publishing rights manager. Los labios del beb deben estar extendidos hacia afuera (evertidos).  Se escucha que traga cada 3 o 4 succiones una vez que la [employer] ha comenzado a Radiographer, therapeutic (despus de que se produzca el reflejo de eyeccin de la [place]).  Hay movimientos musculares por arriba y por delante de sus odos al Printmaker. Signos de que el beb no se ha prendido Audiological scientist al pezn  Hace ruidos de succin o de chasquido mientras se Tree surgeon.  Siente dolor en los pezones. Si cree que el beb no se prendi correctamente, deslice el dedo en la comisura de la boca y [employer]ren Corporation las encas del beb para interrumpir la succin. Intente volver a comenzar a Museum/gallery exhibitions officer. Signos de Market researcher materna exitosa Signos del beb  El beb disminuir gradualmente el nmero de succiones o dejar de succionar por completo.  El beb se quedar dormido.  El cuerpo del beb se relajar.  El beb retendr Neomia Dear pequea cantidad de [facility] boca.  El beb se desprender solo del [place]. Signos que presenta usted  Las mamas han aumentado la firmeza, el peso y el tamao 1 a 3 horas despus de Museum/gallery exhibitions officer.  Estn ms blandas inmediatamente despus de  amamantar.  Se producen un aumento del volumen de [place] y un cambio en su consistencia y color hacia el quinto da de Market researcher.  Los pezones no duelen, no estn agrietados ni sangran. Signos de que su beb recibe la cantidad de leche suficiente  Mojar por lo menos 1 o 2paales durante las primeras 24horas despus del nacimiento.  Mojar por lo menos 5 o 6paales cada 24horas durante la primera semana despus del nacimiento. La orina debe ser clara o de color amarillo plido a los 5das de vida.  Mojar entre 6 y 8paales cada 24horas a medida que el beb sigue creciendo y desarrollndose.  Defeca por lo menos 3 veces en 24 horas a los 5 [address]  [address] de [address] Las heces deben ser blandas y Armed forces operational officer.  Defeca por lo menos 3 veces en 24 horas a los [address] de [address] Las heces deben ser grumosas y Armed forces operational officer.  No registra una prdida de peso mayor al 10% del peso al nacer durante los primeros 3 [address]  [address] de [place].  Aumenta de peso un promedio de 4 a 7onzas (113 a 198g) por semana despus de los 4 [address]  [address] de vida.  Aumenta de [place], [place], de [place] uniforme a Glass blower/designer de los 5 [address]  [address] de vida, sin Passenger transport manager prdida de peso despus de las 2semanas de vida. Despus de alimentarse, es posible que el beb regurgite una pequea cantidad de [place]. Esto es normal. Frecuencia y duracin de la lactancia El amamantamiento frecuente la ayudar a producir ms [place] y puede prevenir dolores en los pezones y las mamas extremadamente llenas (congestin [place]). Alimente al beb cuando muestre signos de hambre o si siente la necesidad de reducir la congestin de las [place]. Esto se denomina "lactancia a demanda". Las seales de que el beb tiene hambre incluyen las siguientes:  Aumento del [place] de [place], [place] o inquietud.  Mueve la cabeza de un lado a otro.  Abre la boca cuando se le toca la mejilla o la comisura de la boca (reflejo de bsqueda).  Aumenta las vocalizaciones, tales como  sonidos de  succin, se relame los labios, emite arrullos, suspiros o chirridos.  Mueve la [employer] boca y se chupa los dedos o las manos.  Est molesto o llora. Evite el uso del chupete en las primeras 4 a 6 semanas despus del nacimiento del beb. Despus de este perodo, podr usar un chupete. Las investigaciones demostraron que el uso del chupete durante Financial risk analyst ao de vida del beb disminuye el riesgo de tener el sndrome de muerte sbita del lactante (SMSL). Permita que el nio se alimente en cada mama todo lo que desee. Cuando el beb se desprende o se queda dormido mientras se est alimentando de la primera mama, ofrzcale la segunda. Debido a que, con frecuencia, los recin nacidos estn somnolientos las primeras semanas de vida, es posible que deba despertar al beb para alimentarlo. Los horarios de Acupuncturist de un beb a otro. Sin embargo, las siguientes reglas pueden servir como gua para ayudarla a Lawyer que el beb se alimenta adecuadamente:  Se puede amamantar a los recin nacidos (bebs de 4 semanas o menos de vida) cada 1 a 3 horas.  No deben transcurrir ms de 3 horas durante el da o 5 horas durante la noche sin que se amamante a los recin nacidos.  Debe amamantar al beb un mnimo de 8 veces en un perodo de 24 horas. Extraccin de [employer] Standard Companies extraccin y Contractor de la leche materna le permiten asegurarse de que el beb se alimente exclusivamente de su leche materna, aun en momentos en los que no puede Museum/gallery exhibitions officer. Esto tiene especial importancia si debe regresar al Aleen Campi en el perodo en que an est amamantando o si no puede estar presente en los momentos en que el beb debe alimentarse. Su asesor en lactancia puede ayudarla a Clinical research associate un mtodo de extraccin que funcione mejor para usted y Programmer, systems cunto tiempo es seguro almacenar [place]. Cmo cuidar las mamas durante la lactancia Los pezones pueden secarse, Lobbyist y doler  durante la Market researcher. Las siguientes recomendaciones pueden ayudarla a Pharmacologist las [employer] y sanas:  Careers information officer usar jabn en los pezones.  Use un sostn de soporte diseado especialmente para la lactancia materna. Evite usar sostenes con aro o sostenes muy ajustados (sostenes deportivos).  Seque al aire sus pezones durante 3 a despus de amamantar al beb.  Utilice solo apsitos de Haematologist sostn para Environmental health practitioner las prdidas de [place]. La prdida de un poco de [employer] Enterprise Group tomas es normal.  Utilice lanolina sobre los pezones luego de Museum/gallery exhibitions officer. La lanolina ayuda a mantener la humedad normal de la piel. La lanolina pura no es perjudicial (no es txica) para el beb. Adems, puede extraer [employer] algunas gotas de [place] materna y Engineer, maintenance (IT) suavemente esa [employer] pezones para que la [place] se seque al aire. Durante las primeras semanas despus del nacimiento, algunas mujeres experimentan [place]. La congestin [employer] [place] [employer] puede hacer que sienta las mamas pesadas, calientes y sensibles al tacto. El pico de la congestin mamaria ocurre en el plazo de los 3 a 5 das despus del [place]. Las siguientes recomendaciones pueden ayudarla a Paramedic la congestin mamaria:  Vace por completo las mamas al [employer] o Environmental health practitioner. Puede aplicar calor hmedo en las mamas (en la ducha o con toallas hmedas para manos) antes de Museum/gallery exhibitions officer o extraer [employer]. Esto aumenta la circulacin y [place] a que la [place]. Si el beb no vaca por  completo las mamas cuando lo [place], extraiga la [place] restante despus de que haya finalizado.  Aplique compresas de hielo [employer] inmediatamente despus de Museum/gallery exhibitions officer o extraer [place], a menos que le resulte demasiado incmodo. Haga lo siguiente: ? Ponga el hielo en una bolsa plstica. ? Coloque una FirstEnergy Corp piel y la bolsa de hielo. ? Coloque el hielo durante , 2 o 3veces por da.  Asegrese de que el beb  est prendido y se encuentre en la posicin correcta mientras lo alimenta. Si la congestin mamaria persiste luego de 48 horas o despus de seguir estas recomendaciones, comunquese con su mdico o un Holiday representative. Recomendaciones de salud general durante la lactancia  Consuma 3 comidas y 3 colaciones saludables todos los [place]. Las [employer] Holdings bien alimentadas que amamantan necesitan entre 450 y 500 caloras adicionales por Futures trader. Puede cumplir con este requisito al aumentar la cantidad de una dieta equilibrada que realice.  Beba suficiente agua para mantener la orina clara o de color amarillo plido.  Descanse con frecuencia, reljese y siga tomando sus vitaminas prenatales para prevenir la fatiga, el estrs y los niveles bajos de vitaminas y [employer] en el cuerpo (deficiencias de nutrientes).  No consuma ningn producto que contenga nicotina o tabaco, como cigarrillos y Administrator, Civil Service. El beb puede verse afectado por las sustancias qumicas de los cigarrillos que pasan a la [place] materna y por la exposicin al humo ambiental del tabaco. Si necesita ayuda para dejar de fumar, consulte al mdico.  Evite el consumo de alcohol.  No consuma drogas ilegales o marihuana.  Antes de Dietitian, hable con el mdico. Estos incluyen medicamentos recetados y de [place], como tambin vitaminas y suplementos a base de hierbas. Algunos medicamentos, que pueden ser perjudiciales para el beb, pueden pasar a travs de la [employer] Pal[employer].  Puede quedar embarazada durante la lactancia. Si se desea un mtodo anticonceptivo, consulte al mdico sobre cules son las opciones seguras durante la Market researcher. Dnde encontrar ms informacin: Liga internacional [place]: [email or web address] Comunquese con un mdico si:  Siente que quiere dejar de Museum/gallery exhibitions officer o se siente frustrada con la lactancia.  Sus pezones estn agrietados o Water quality scientist.  Sus mamas estn irritadas, sensibles o  calientes.  Tiene los siguientes sntomas: ? Dolor en las mamas o en los pezones. ? Un rea hinchada en cualquiera de las mamas. ? Grant Ruts o escalofros. ? Nuseas o vmitos. ? Drenaje de otro lquido distinto de la W[employer] Resources materna desde los pezones.  Sus mamas no se llenan antes de Museum/gallery exhibitions officer al beb para el quinto da despus del [place].  Se siente triste y deprimida.  El beb: ? Est demasiado somnoliento como para comer bien. ? Tiene problemas para dormir. ? Tiene ms de 1 semana de vida y [employer] de 6 paales en un periodo de 24 horas. ? No ha aumentado de [place] a los [address] de [address]  El beb defeca menos de 3 veces en 24 horas.  La piel del beb o las partes blancas de los ojos se vuelven amarillentas. Solicite ayuda de inmediato si:  El beb est muy cansado Retail buyer) y no se quiere despertar para comer.  Le sube la fiebre sin causa. Resumen  La lactancia materna ofrece muchos beneficios de salud para bebs y [place].  Intente amamantar a su beb cuando muestre signos tempranos de hambre.  Haga cosquillas o empuje suavemente los labios del beb con el dedo o el pezn para lograr que el beb  abra la boca. Acerque el beb a la mama. Asegrese de que la mayor parte de la arola se encuentre dentro de la boca del beb. Ofrzcale una mama y haga eructar al beb antes de pasar a la otra.  Hable con su mdico o asesor en lactancia si tiene dudas o problemas con la lactancia. Esta informacin no tiene Theme park manager el consejo del mdico. Asegrese de hacerle al mdico cualquier pregunta que tenga. Document Revised: 11/23/2017 Document Reviewed: 12/19/2016 Elsevier Patient Education  2020 [employer].

## 2020-09-01 NOTE — BH Specialist Note (Addendum)
First call to pt through Spanish interpreter Roxanna Mew [phone]; Left HIPPA-compliant message to call back Asher Muir from [facility] for [employer] at [facility] at [phone] [facility] office).   Pt returned call; second call through Spanish interpreter Alethia Berthold [phone]: pt arrived to video visit (Caregility)  is currently in the hospital, no questions or concerns today; agrees to reschedule follow up virtual visit with [facility] on 09/30/20 at 1:15pm. Pt says she will be in the hospital until at least Thursday, 09/17/20.   Less than 10 minutes via video (Caregility)

## 2020-09-12 NOTE — L&D Delivery Note (Signed)
OB/GYN Faculty Practice Delivery Note  Catherine Morris is a 40 y.o. [id number] s/p NSVD at [redacted]w[redacted]d. She was admitted for SROM.   ROM: 1h 80m with clear fluid GBS Status: unknown   Maximum Maternal Temperature:  No data recorded.    Labor Progress:  Patient arrived at 9 cm dilation and quickly progressed to complete.   Delivery Date/Time: 12/03/2020 at 1723 Delivery: Called to room and patient was complete and pushing. Head delivered in LOA position. Nuchal x1, not reducible, delivered body through with somersault. Infant with spontaneous cry, placed on mother's abdomen, dried and stimulated. Cord clamped x 2 after 1-minute delay, and cut by myself. Cord blood drawn. Placenta delivered spontaneously with gentle cord traction. Fundus firm with massage and Pitocin. Labia, perineum, vagina, and cervix inspected with only small abrasions.   Placenta: 3v intact, to L&D Complications: none Lacerations: none EBL: 200 cc Analgesia: none   Infant: Baby Boy Catherine Morris  APGAR (1 MIN): 8   APGAR (5 MINS): 9    Weight: 2960 grams  Venora Maples, MD/MPH Attending Family Medicine Physician, [facility] for [facility], [facility]

## 2020-09-14 ENCOUNTER — Encounter (HOSPITAL_COMMUNITY): Payer: Self-pay

## 2020-09-14 ENCOUNTER — Inpatient Hospital Stay (HOSPITAL_COMMUNITY): Payer: Self-pay

## 2020-09-14 ENCOUNTER — Inpatient Hospital Stay (HOSPITAL_COMMUNITY)
Admission: EM | Admit: 2020-09-14 | Discharge: 2020-09-18 | DRG: 832 | Disposition: A | Discharge status: Home / Self Care | Payer: Self-pay | Attending: Obstetrics and Gynecology | Admitting: Obstetrics and Gynecology

## 2020-09-14 ENCOUNTER — Other Ambulatory Visit: Payer: Self-pay

## 2020-09-14 DIAGNOSIS — O4692 Antepartum hemorrhage, unspecified, second trimester: Secondary | ICD-10-CM

## 2020-09-14 DIAGNOSIS — Z6790 Unspecified blood type, Rh positive: Secondary | ICD-10-CM

## 2020-09-14 DIAGNOSIS — Z7984 Long term (current) use of oral hypoglycemic drugs: Secondary | ICD-10-CM

## 2020-09-14 DIAGNOSIS — O09522 Supervision of elderly multigravida, second trimester: Secondary | ICD-10-CM

## 2020-09-14 DIAGNOSIS — Z3A25 25 weeks gestation of pregnancy: Secondary | ICD-10-CM

## 2020-09-14 DIAGNOSIS — O09892 Supervision of other high risk pregnancies, second trimester: Secondary | ICD-10-CM

## 2020-09-14 DIAGNOSIS — O0990 Supervision of high risk pregnancy, unspecified, unspecified trimester: Secondary | ICD-10-CM

## 2020-09-14 DIAGNOSIS — O4690 Antepartum hemorrhage, unspecified, unspecified trimester: Secondary | ICD-10-CM | POA: Diagnosis present

## 2020-09-14 DIAGNOSIS — E119 Type 2 diabetes mellitus without complications: Secondary | ICD-10-CM | POA: Diagnosis present

## 2020-09-14 DIAGNOSIS — O24112 Pre-existing diabetes mellitus, type 2, in pregnancy, second trimester: Secondary | ICD-10-CM | POA: Diagnosis present

## 2020-09-14 DIAGNOSIS — Z20822 Contact with and (suspected) exposure to covid-19: Secondary | ICD-10-CM | POA: Diagnosis present

## 2020-09-14 LAB — COMPREHENSIVE METABOLIC PANEL
ALT: 14 U/L (ref 0–44)
AST: 15 U/L (ref 15–41)
Albumin: 3 g/dL — ABNORMAL LOW (ref 3.5–5.0)
Alkaline Phosphatase: 70 U/L (ref 38–126)
Anion gap: 10 (ref 5–15)
BUN: 11 mg/dL (ref 6–20)
CO2: 21 mmol/L — ABNORMAL LOW (ref 22–32)
Calcium: 8.9 mg/dL (ref 8.9–10.3)
Chloride: 102 mmol/L (ref 98–111)
Creatinine, Ser: 0.89 mg/dL (ref 0.44–1.00)
GFR, Estimated: >60 mL/min (ref >60)
Glucose, Bld: 99 mg/dL (ref 70–99)
Potassium: 3.5 mmol/L (ref 3.5–5.1)
Sodium: 133 mmol/L — ABNORMAL LOW (ref 135–145)
Total Bilirubin: 0.4 mg/dL (ref 0.3–1.2)
Total Protein: 6.5 g/dL (ref 6.5–8.1)

## 2020-09-14 LAB — WET PREP, GENITAL
Clue Cells Wet Prep HPF POC: NONE SEEN
Sperm: NONE SEEN
Trich, Wet Prep: NONE SEEN
Yeast Wet Prep HPF POC: NONE SEEN

## 2020-09-14 LAB — CBC
HCT: 31.2 % — ABNORMAL LOW (ref 36.0–46.0)
Hemoglobin: 10.8 g/dL — ABNORMAL LOW (ref 12.0–15.0)
MCH: 31.7 pg (ref 26.0–34.0)
MCHC: 34.6 g/dL (ref 30.0–36.0)
MCV: 91.5 fL (ref 80.0–100.0)
Platelets: 267 10*3/uL (ref 150–400)
RBC: 3.41 MIL/uL — ABNORMAL LOW (ref 3.87–5.11)
RDW: 11.8 % (ref 11.5–15.5)
WBC: 11 10*3/uL — ABNORMAL HIGH (ref 4.0–10.5)
nRBC: 0 % (ref 0.0–0.2)

## 2020-09-14 LAB — TYPE AND SCREEN
ABO/RH(D): O POS
Antibody Screen: NEGATIVE

## 2020-09-14 LAB — RESP PANEL BY RT-PCR (FLU A&B, COVID) ARPGX2
Influenza A by PCR: NEGATIVE
Influenza B by PCR: NEGATIVE
SARS Coronavirus 2 by RT PCR: NEGATIVE

## 2020-09-14 MED ORDER — DOCUSATE SODIUM 100 MG PO CAPS
100.0000 mg | ORAL_CAPSULE | Freq: Every day | ORAL | Status: DC
  Administered 2020-09-15 – 2020-09-17 (×3): 100 mg via ORAL
  Filled 2020-09-14 (×3): qty 1

## 2020-09-14 MED ORDER — PRENATAL MULTIVITAMIN CH
1.0000 | ORAL_TABLET | Freq: Every day | ORAL | Status: DC
  Administered 2020-09-15 – 2020-09-17 (×3): 1 via ORAL
  Filled 2020-09-14 (×3): qty 1

## 2020-09-14 MED ORDER — ZOLPIDEM TARTRATE 5 MG PO TABS: 5.0000 mg | ORAL_TABLET | Freq: Every evening | ORAL | Status: DC | PRN

## 2020-09-14 MED ORDER — ACETAMINOPHEN 325 MG PO TABS: 650.0000 mg | ORAL_TABLET | ORAL | Status: DC | PRN

## 2020-09-14 MED ORDER — CALCIUM CARBONATE ANTACID 500 MG PO CHEW: 2.0000 | CHEWABLE_TABLET | ORAL | Status: DC | PRN

## 2020-09-14 MED ORDER — BETAMETHASONE SOD PHOS & ACET 6 (3-3) MG/ML IJ SUSP
12.0000 mg | INTRAMUSCULAR | Status: AC
  Administered 2020-09-14 – 2020-09-15 (×2): 12 mg via INTRAMUSCULAR
  Filled 2020-09-14: qty 5

## 2020-09-14 NOTE — ED Triage Notes (Signed)
Patient reports she starting vaginal bleeding about 30 minutes ago, reports she is [redacted] weeks pregnant. Reports a little bit of cramping, reports this is her third baby

## 2020-09-14 NOTE — H&P (Signed)
Catherine Morris is a 40 y.o. female presenting for vaginal bleeding that started around 7pm this evening. Patient reports she passed two large clots at home and has been wearing a pad that had a lot of blood on it but was not saturated. Patient denies prior bleeding in this pregnancy, but did have a small subchorionic hemorrhage noted on early [place] 05/08/2020. Patient reports last intercourse was on Saturday.  OB History    Gravida  3   Para  2   Term  1   Preterm  1   AB      Living  2     SAB      IAB      Ectopic      Multiple      Live Births  2          Past Medical History:  Diagnosis Date  . Back pain   . Bipolar disorder (HCC)   . Depression   . Diabetes mellitus without complication (HCC)   . Gestational diabetes   . Knee pain    Past Surgical History:  Procedure Laterality Date  . NO PAST SURGERIES     Family History: family history includes Diabetes in her brother and father; Hypertension in her mother. Social History:  reports that she has never smoked. She has never used smokeless tobacco. She reports that she does not drink alcohol and does not use drugs.     Maternal Diabetes: Yes:  Diabetes Type:  Insulin/Medication controlled Genetic Screening: Normal Maternal Ultrasounds/Referrals: Normal Fetal Ultrasounds or other Referrals:  Fetal echo Maternal Substance Abuse:  No Significant Maternal Medications:  Meds include: Other: Metformin Significant Maternal Lab Results:  None Other Comments:  None  Review of Systems  Gastrointestinal: Negative for abdominal pain.  Genitourinary: Positive for vaginal bleeding. Negative for pelvic pain.  Musculoskeletal: Negative for back pain.  Neurological: Negative for dizziness, light-headedness and headaches.   History   Blood pressure 107/64, pulse 86, temperature 98.6 F (37 C), temperature source Oral, resp. rate 20, height 5\' 1"  (1.549 m), weight 62.9 kg, last menstrual period 03/15/2020,  SpO2 99 %.   Patient Vitals for the past 24 hrs:  BP Temp Temp src Pulse Resp SpO2 Height Weight  09/14/20 2022 107/64 98.6 F (37 C) Oral 86 20 -- 5\' 1"  (1.549 m) 62.9 kg  09/14/20 1945 121/84 98 F (36.7 C) Oral 92 18 99 % -- --  09/14/20 1944 -- -- -- -- -- -- 5\' 1"  (1.549 m) 61.7 kg    Maternal Exam:  Introitus: Normal vulva.   Physical Exam Vitals and nursing note reviewed. Exam conducted with a chaperone present.  Constitutional:      General: She is not in acute distress.    Appearance: Normal appearance. She is not ill-appearing, toxic-appearing or diaphoretic.  HENT:     Head: Normocephalic and atraumatic.  Pulmonary:     Effort: Pulmonary effort is normal.  Abdominal:     Palpations: Abdomen is soft.  Genitourinary:    General: Normal vulva.     Labia:        Right: No rash, tenderness or lesion.        Left: No rash, tenderness or lesion.      Vagina: Bleeding present.     Cervix: Cervical bleeding present.     Comments: Cervix appears visually closed. Large clot, apprx 3cm x 5cm x 1cm removed on speculum exam with small amount of bright red, liquid blood  pooling in vault with small amount of active bleeding coming from external os. Skin:    General: Skin is warm and dry.  Neurological:     Mental Status: She is alert and oriented to person, place, and time.  Psychiatric:        Mood and Affect: Mood normal.        Behavior: Behavior normal.        Thought Content: Thought content normal.        Judgment: Judgment normal.     Prenatal labs: ABO, Rh: O/Positive/-- (10/08 1002) Antibody: Negative (10/08 1002) Rubella: 5.36 (10/08 1002) RPR: Non Reactive (10/08 1002)  HBsAg: Negative (10/08 1002)  HIV: Non Reactive (10/08 1002)  GBS:     Assessment/Plan: 1. Vaginal bleeding in pregnancy   2. Supervision of high risk pregnancy, antepartum   3. Blood type, Rh positive   4. [redacted] weeks gestation of pregnancy    -admit to [facility] for  observation   Odie Sera Roberto Romanoski 09/14/2020, 9:24 PM

## 2020-09-14 NOTE — MAU Note (Signed)
PT SAYS AT 1130 THIS AM - SHE SNEEZED-  URINE CAME OUT . WENT TO B-ROOM AT WORK - NO BLOOD .     THEN 715PM WHEN HOME HOME FROM WORK - BLOOD CAME OUT WHEN URINATED.      IN TRIAGE - PAD ON - SMALL AMT BLOOD. Marland Kitchen   HAD BLEEDING IN EARLY PREG.     LAST SEX- SAT.   [facility]- CLINIC. Marland Kitchen   MILD PAIN IN LOWER ABD - STARTED  AT 730PM.

## 2020-09-14 NOTE — ED Triage Notes (Signed)
Emergency Medicine Provider OB Triage Evaluation Note  Catherine Morris is a 40 y.o. female, [id number], at [redacted]w[redacted]d gestation who presents to the emergency department with complaints of vaginal bleeding.  This started about an hour prior to arrival.  She states that she did sneeze hard earlier and thought she urinated.  She denies abdominal pain.  Review of  Systems  Positive: Vaginal bleeding,  Negative: No abdominal pain, no back pain. No light headed.  No dizziness.    Physical Exam  BP 121/84   Pulse 92   Temp 98 F (36.7 C) (Oral)   Resp 18   Ht 5\' 1"  (1.549 m)   Wt 61.7 kg   LMP 03/15/2020   SpO2 99%   BMI 25.70 kg/m  General: Awake, no distress HEENT: Atraumatic  Resp: Normal effort  Cardiac: Normal rate Abd: Nondistended, nontender  MSK: Moves all extremities without difficulty Neuro: Speech clear  Medical Decision Making  Pt evaluated for pregnancy concern and is stable for transfer to MAU. Pt is in agreement with plan for transfer.  7:58 PM Discussed with MAU APP, who accepts patient in transfer.  Clinical Impression   1. Vaginal bleeding in pregnancy        05/16/2020, Cristina Gong 09/14/20 1959

## 2020-09-15 ENCOUNTER — Ambulatory Visit: Payer: Self-pay | Admitting: Clinical

## 2020-09-15 DIAGNOSIS — Z6790 Unspecified blood type, Rh positive: Secondary | ICD-10-CM

## 2020-09-15 DIAGNOSIS — Z8659 Personal history of other mental and behavioral disorders: Secondary | ICD-10-CM

## 2020-09-15 DIAGNOSIS — O0992 Supervision of high risk pregnancy, unspecified, second trimester: Secondary | ICD-10-CM

## 2020-09-15 DIAGNOSIS — Z3A25 25 weeks gestation of pregnancy: Secondary | ICD-10-CM

## 2020-09-15 DIAGNOSIS — O4692 Antepartum hemorrhage, unspecified, second trimester: Principal | ICD-10-CM

## 2020-09-15 LAB — CBC
HCT: 29.9 % — ABNORMAL LOW (ref 36.0–46.0)
Hemoglobin: 10.5 g/dL — ABNORMAL LOW (ref 12.0–15.0)
MCH: 32.2 pg (ref 26.0–34.0)
MCHC: 35.1 g/dL (ref 30.0–36.0)
MCV: 91.7 fL (ref 80.0–100.0)
Platelets: 245 10*3/uL (ref 150–400)
RBC: 3.26 MIL/uL — ABNORMAL LOW (ref 3.87–5.11)
RDW: 11.9 % (ref 11.5–15.5)
WBC: 8.7 10*3/uL (ref 4.0–10.5)
nRBC: 0 % (ref 0.0–0.2)

## 2020-09-15 LAB — GLUCOSE, CAPILLARY
Glucose-Capillary: 126 mg/dL — ABNORMAL HIGH (ref 70–99)
Glucose-Capillary: 128 mg/dL — ABNORMAL HIGH (ref 70–99)
Glucose-Capillary: 110 mg/dL — ABNORMAL HIGH (ref 70–99)

## 2020-09-15 LAB — GC/CHLAMYDIA PROBE AMP (~~LOC~~) NOT AT ARMC
Chlamydia: NEGATIVE
Comment: NEGATIVE
Comment: NORMAL
Neisseria Gonorrhea: NEGATIVE

## 2020-09-15 MED ORDER — METFORMIN HCL 500 MG PO TABS
500.0000 mg | ORAL_TABLET | Freq: Two times a day (BID) | ORAL | Status: DC
Start: 2020-09-15 — End: 2020-09-16
  Administered 2020-09-15 (×2): 500 mg via ORAL
  Filled 2020-09-15 (×2): qty 1

## 2020-09-15 NOTE — Patient Instructions (Signed)
Center for [facility] [place], [place] [address] [phone] (main office) [phone] office)    [facility] de mujeres  Plan posparto para la familia   Un plan de juego realista para ayudarnos a adaptarnos a la vida con un nuevo beb.     REUNIN CREATIVA Desarrolla un plan Metas: . Proporcione una forma de comenzar una conversacin sobre su nueva vida con un beb . Ayudar a los padres a Marine scientist y [employer] recursos a Geneticist, molecular . Ayuda a allanar el camino antes del nacimiento para un perodo de transicin ms fcil despus.  Haga una lista de la siguiente informacin para Theatre manager en una ubicacin central: . Nombre completo de mam y compaero: ______________________ . Nombre completo y fecha de nacimiento del beb: ________________ .Direccion de casa: __________________________ .Telfono de casa: _________________________ . Nmeros de clulas de los padres: _____________________ . Nombre e informacin de contacto para OB: ____________________ . Nombre e informacin de contacto del pediatra: _______________________________ . Informacin de contacto para Consultores de Lactancia: ________________________  [place] DUERME * Cada uno necesita al menos 4-5 horas de sueo ininterrumpido todos los [place]. Escriba nombres especficos e informacin de contacto. * . Cmo vas a descansar en el perodo posparto? Mientras el compaero est en casa? Cuando el compaero vuelve al [place]? Cuando ambos vuelven al [place]? Jolene Provost dormir tu beb?  Ballard Russell est disponible para ayudar [place]? Noche? Noche? Ballard Russell podra mudarse por un perodo para ayudarlo?  Santiago Bumpers son algunas ideas para ayudarlo a dormir lo  suficiente? ________________________________________________________________________________________________________________________________________________________________________________________________________________________________________________________________________________________________________________________  Inez Catalina * Planifique las comidas antes de que nazca su beb para que pueda comer alimentos saludables durante el perodo de posparto inmediato. * Ballard Russell se ocupar del desayuno? Almuerzo? Cena? Lista de nombres e informacin de contacto. Haga una lluvia de ideas rpidas y saludables para cada comida. Sander Nephew puede hacer antes de que nazca el beb para preparar las comidas para el perodo de posparto? . Cmo pueden otros ayudarte con las comidas? Sander Nephew supermercados ofrecen compras y entregas en lnea? Sander Nephew restaurantes ofrecen opciones de comida para llevar o para llevar? ______________________________________________________________________________________________________________________________________________________________________________________________________________________________________________________________________________________________________________________________________________________________________________________________________  CUIDADO CON LA MAM * Es importante que mam sea cuidada y [place] en el perodo de posparto. Recuerde, las formas ms importantes en que las nuevas madres necesitan atencin son: sueo, nutricin, ejercicio suave y [place]. Ballard Russell puede venir a cuidar a mam durante este perodo? Haga una lista de personas con su informacin de contacto. Edison Simon actividades que lo hagan sentir protegido, descansado y con Teacher, early years/pre. Quin puede asegurarse de tener oportunidades para hacer estas cosas? Phylliss Bob mam un espacio propio dentro de su hogar que sea solo para ella? Haga una "cueva de mam"  donde pueda sentirse cmoda, descansar y renovarse a s misma a diario. ______________________________________________________________________________________________________________________________________________________________________________________________________________________________________________________________________________________________________________________________________________________________________________________________________  De Blanch ALIMENTACIN DEL BEB * Las personas informadas y Scientist, research (life sciences) ofrecern el mejor apoyo con respecto a la alimentacin de su beb. Elwin Mocha y Manito opcin de alimentacin para su beb. Lorayne Marek lista de las personas que lo guiarn, apoyarn y sern un recurso para usted a medida que cuida y [place] a su beb. (Amigos que han amamantado o estn amamantando actualmente, consultores de [place], grupos de apoyo para la lactancia, etc.) . Considera una doula de posparto. (Estos sitios web pueden brindarle informacin: dona.org y BuyingShow.es) . Busque recursos locales de Transport planner  materna, como el grupo de apoyo a Patent examiner en Women's o [employer]. ________________________________________________________________________________________________________________________________________________________________________________________________________________________________________________________________________________________________________________________  [name] ERRANDS . Quin puede ayudar con una limpieza profunda antes de que nazca el beb? Marland Kitchen Haga una lista de las personas que lo ayudarn con las tareas domsticas y las tareas domsticas, como lavandera, limpieza ligera, platos, baos, etc. . Quin puede hacerte algunos mandados? [name] puedes hacer para asegurarte de tener provisiones bsicas antes de que nazca el beb? [name] har las  compras? __________________________________________________________________________________________________________________________________________________________________________________________________________________________________________ [name] * Nutrirse ... ayuda a los padres a ser ms amorosos y permite una mejor unin con sus hijos. [name] tipo de cosas disfrutan t y tu pareja juntos? Qu actividades te ayudan a conectarte y Chief Operating Officer tu relacin? Haga una lista de esas cosas. Haga una lista de las personas en quienes confa para que cuiden a su beb para que puedan pasar un tiempo juntos como pareja. [name] tipos de cosas ayudan a la pareja a sentirse conectada con mam? Hacer una lista. [name] necesidades tendr el compaero para vincularse con el beb? Marland KitchenOtros nios? Quin los cuidar cuando entre a Printmaker y [place] est en el hospital? . Eula Flax cules podran ser las necesidades de tus hijos [place]. Quin puede ayudarlo a satisfacer esas necesidades? De qu manera los est ayudando a prepararse para llevar a su beb a casa? Enumere algunas estrategias especficas que tiene para Press photographer. ______________________________________________________________________________________________________________________________________________________________________________________________________________________________________________________________________________________________________________________________________________________________________________________________________  Andee Poles * Alguien que puede empatizar con las experiencias normaliza tus problemas y los hace ms llevaderos. * . [name] lista de otros amigos, vecinos y / o compaeros de trabajo que conozca con bebs (y nios pequeos, si corresponde) con quienes puede conectarse. [name] lista de grupos de apoyo locales o en lnea, grupos de [place], Catering manager. en los que Actor. __________________________________________________________________________________________________________________________________________________________________________________________________________________________________________     PLANES DE CUIDADO DE NIOS . Investigar y Marine scientist el cuidado de los nios si la mam regresa al [place]. Iona Hansen sobre las preocupaciones de mam sobre su transicin al Aleen Campi. Iona Hansen sobre las preocupaciones de los socios con respecto a esta transicin.  SALUD MENTAL * Su salud mental es una de las principales prioridades para una madre embarazada o posparto.  . 1 de cada 5 mujeres experimenta ansiedad y / o depresin desde el momento de la concepcin Regulatory affairs officer ao despus del nacimiento. . Los trastornos del estado de nimo posparto son la complicacin n.  1 del embarazo y [place], y el sufrimiento experimentado por estas madres no es necesario! Estas enfermedades son temporales y responden bien al tratamiento, que a menudo incluye autocuidado, apoyo social, terapia de conversacin y medicamentos cuando sea necesario. . Las mujeres que experimentan ansiedad y depresin a menudo dicen cosas como: "Se supone que soy feliz ... por qu me siento tan triste?", "Por qu no puedo salir de eso?", "Tengo pensamientos que Leonette Nutting" yo." . No hay necesidad de avergonzarse si siente estos sntomas: . Abrumado, ansioso, enojado, triste, culpable, irritable, desesperado, agotado pero no puede dormer No estas solo. Usted no tiene Counsellor. Con ayuda, estars bien. . Dnde puedo encontrar ayuda? Profesionales mdicos como su obstetra, partera, gineclogo, Pharmacist, community, proveedor de atencin primaria, pediatra o proveedores de salud mental; Grupos de apoyo de [facility]: Feelings After Birth, Georges Mouse de apoyo para la lactancia materna, Baby and Me Group, y Fit [employer] Dos clases de ejercicios. Richard Miu permiso para pedir ayuda. Confirmar sus  sentimientos, validar sus experiencias,  compartir / aprender Artist de afrontamiento y obtendr apoyo y [place] a medida que se recupere. Eres importante!  IDEA GENIAL . Haga una lista de recursos locales, [employer] recursos para mam y para Insurance risk surveyor. . Identificar grupos de apoyo. . Identifique a las personas para llamar a altas horas de la noche: incluya nombres e informacin de contacto. Iona Hansen con su pareja sobre el estado de nimo perinatal y los trastornos de ansiedad. Iona Hansen con su obstetra, partera y doula sobre el baby blues y [place] de nimo perinatal y los trastornos de ansiedad. Iona Hansen con su pediatra sobre el estado de nimo perinatal y los trastornos de ansiedad.

## 2020-09-15 NOTE — Progress Notes (Signed)
Check on pt needs, ordered meals, by Orlan Leavens Spanish Medical interpreter.

## 2020-09-15 NOTE — Progress Notes (Signed)
FACULTY PRACTICE ANTEPARTUM PROGRESS NOTE  Catherine Morris is a 40 y.o. [id number] at [redacted]w[redacted]d who is admitted for significant vaginal bleeding.  Estimated Date of Delivery: 12/27/20 Fetal presentation is cephalic.  Length of Stay:  1 Days. Admitted 09/14/2020  Subjective: Pt seen this AM.  She is doing well.  She denies any further vaginal bleeding. Patient reports normal fetal movement.  She denies uterine contractions, or leaking of fluid per vagina.  The patient denies abdominal pain.  Vitals:  Blood pressure (!) 97/56, pulse 68, temperature 98.3 F (36.8 C), temperature source Oral, resp. rate 18, height 5\' 1"  (1.549 m), weight 62.9 kg, last menstrual period 03/15/2020, SpO2 99 %. Physical Examination: CONSTITUTIONAL: Well-developed, well-nourished female in no acute distress.  HENT:  Normocephalic, atraumatic, External right and left ear normal. Oropharynx is clear and moist EYES: Conjunctivae and EOM are normal.  NECK: Normal range of motion, supple, no masses. SKIN: Skin is warm and dry. No rash noted. Not diaphoretic. No erythema. No pallor. NEUROLGIC: Alert and oriented to person, place, and time. Normal reflexes, muscle tone coordination. No cranial nerve deficit noted. PSYCHIATRIC: Normal mood and affect. Normal behavior. Normal judgment and thought content. CARDIOVASCULAR: Normal heart rate noted, regular rhythm RESPIRATORY: Effort and breath sounds normal, no problems with respiration noted MUSCULOSKELETAL: Normal range of motion. No edema and no tenderness. ABDOMEN: Soft, nontender, nondistended, gravid. CERVIX: deferred  Fetal monitoring: FHR: 130s bpm, Variability: moderate, Accelerations: Present, Decelerations: small variable  Uterine activity: none   Results for orders placed or performed during the hospital encounter of 09/14/20 (from the past 48 hour(s))  Wet prep, genital     Status: Abnormal   Collection Time: 09/14/20  8:45 PM  Result Value Ref Range    Yeast Wet Prep HPF POC NONE SEEN NONE SEEN   Trich, Wet Prep NONE SEEN NONE SEEN   Clue Cells Wet Prep HPF POC NONE SEEN NONE SEEN   WBC, Wet Prep HPF POC FEW (A) NONE SEEN   Sperm NONE SEEN     Comment: Performed at [facility], [address], [place], [place]  Resp Panel by RT-PCR (Flu A&B, Covid) Nasopharyngeal Swab     Status: None   Collection Time: 09/14/20  8:57 PM   Specimen: Nasopharyngeal Swab; Nasopharyngeal(NP) swabs in vial transport medium  Result Value Ref Range   SARS Coronavirus 2 by RT PCR NEGATIVE NEGATIVE    Comment: (NOTE) SARS-CoV-2 target nucleic acids are NOT DETECTED.  The SARS-CoV-2 RNA is generally detectable in upper respiratory specimens during the acute phase of infection. The lowest concentration of SARS-CoV-2 viral copies this assay can detect is 138 copies/mL. A negative result does not preclude SARS-Cov-2 infection and should not be used as the sole basis for treatment or other patient management decisions. A negative result may occur with  improper specimen collection/handling, submission of specimen other than nasopharyngeal swab, presence of viral mutation(s) within the areas targeted by this assay, and inadequate number of viral copies(<138 copies/mL). A negative result must be combined with clinical observations, patient history, and epidemiological information. The expected result is Negative.  Fact Sheet for Patients:  11/12/20  Fact Sheet for Healthcare Providers:  BloggerCourse.com  This test is no t yet approved or cleared by the SeriousBroker.it FDA and  has been authorized for detection and/or diagnosis of SARS-CoV-2 by FDA under an Emergency Use Authorization (EUA). This EUA will remain  in effect (meaning this test can be used) for the duration  of the COVID-19 declaration under Section 564(b)(1) of the Act, 21 U.S.C.section 360bbb-3(b)(1), unless the  authorization is terminated  or revoked sooner.       Influenza A by PCR NEGATIVE NEGATIVE   Influenza B by PCR NEGATIVE NEGATIVE    Comment: (NOTE) The Xpert Xpress SARS-CoV-2/FLU/RSV plus assay is intended as an aid in the diagnosis of influenza from Nasopharyngeal swab specimens and should not be used as a sole basis for treatment. Nasal washings and aspirates are unacceptable for Xpert Xpress SARS-CoV-2/FLU/RSV testing.  Fact Sheet for Patients: EntrepreneurPulse.com.au  Fact Sheet for Healthcare Providers: IncredibleEmployment.be  This test is not yet approved or cleared by the [place] FDA and has been authorized for detection and/or diagnosis of SARS-CoV-2 by FDA under an Emergency Use Authorization (EUA). This EUA will remain in effect (meaning this test can be used) for the duration of the COVID-19 declaration under Section 564(b)(1) of the Act, 21 U.S.C. section 360bbb-3(b)(1), unless the authorization is terminated or revoked.  Performed at [facility], [place] [address] [place], [place] [address]   Type and screen [place]     Status: None   Collection Time: 09/14/20  9:00 PM  Result Value Ref Range   ABO/RH(D) O POS    Antibody Screen NEG    Sample Expiration      09/17/2020,2359 Performed at [facility], [place] [address], [place], [place] [address]   Comprehensive metabolic panel     Status: Abnormal   Collection Time: 09/14/20  9:00 PM  Result Value Ref Range   Sodium 133 (L) 135 - 145 mmol/L   Potassium 3.5 3.5 - 5.1 mmol/L   Chloride 102 98 - 111 mmol/L   CO2 21 (L) 22 - 32 mmol/L   Glucose, Bld 99 70 - 99 mg/dL    Comment: Glucose reference range applies only to samples taken after fasting for at least 8 hours.   BUN 11 6 - 20 mg/dL   Creatinine, Ser 0.89 0.44 - 1.00 mg/dL   Calcium 8.9 8.9 - 10.3 mg/dL   Total Protein 6.5 6.5 - 8.1 g/dL   Albumin 3.0 (L) 3.5 - 5.0 g/dL   AST  15 15 - 41 U/L   ALT 14 0 - 44 U/L   Alkaline Phosphatase 70 38 - 126 U/L   Total Bilirubin 0.4 0.3 - 1.2 mg/dL   GFR, Estimated >60 >60 mL/min    Comment: (NOTE) Calculated using the CKD-EPI Creatinine Equation (2021)    Anion gap 10 5 - 15    Comment: Performed at [place] [facility] [address], [place], [place] [address]  CBC on admission     Status: Abnormal   Collection Time: 09/14/20  9:00 PM  Result Value Ref Range   WBC 11.0 (H) 4.0 - 10.5 K/uL   RBC 3.41 (L) 3.87 - 5.11 MIL/uL   Hemoglobin 10.8 (L) 12.0 - 15.0 g/dL   HCT 31.2 (L) 36.0 - 46.0 %   MCV 91.5 80.0 - 100.0 fL   MCH 31.7 26.0 - 34.0 pg   MCHC 34.6 30.0 - 36.0 g/dL   RDW 11.8 11.5 - 15.5 %   Platelets 267 150 - 400 K/uL   nRBC 0.0 0.0 - 0.2 %    Comment: Performed at [facility], [place] [address], [place],  [address]  CBC     Status: Abnormal   Collection Time: 09/15/20  4:44 AM  Result Value  Ref Range   WBC 8.7 4.0 - 10.5 K/uL   RBC 3.26 (L) 3.87 - 5.11 MIL/uL   Hemoglobin 10.5 (L) 12.0 - 15.0 g/dL   HCT 38.8 (L) 82.8 - 00.3 %   MCV 91.7 80.0 - 100.0 fL   MCH 32.2 26.0 - 34.0 pg   MCHC 35.1 30.0 - 36.0 g/dL   RDW 49.1 79.1 - 50.5 %   Platelets 245 150 - 400 K/uL   nRBC 0.0 0.0 - 0.2 %    Comment: Performed at [facility], [address] [place], [place] [address]  Glucose, capillary     Status: Abnormal   Collection Time: 09/15/20  8:34 AM  Result Value Ref Range   Glucose-Capillary 126 (H) 70 - 99 mg/dL    Comment: Glucose reference range applies only to samples taken after fasting for at least 8 hours.    I have reviewed the patient's current medications.  ASSESSMENT: Active Problems:   Vaginal bleeding in pregnancy   PLAN: No further bleeding at this time. US shows posterior placenta with no evidence of previa Fetal tracing is reassuring, category 1, will go to daily NST after today. Monitor in house 5-7 days   Continue routine antenatal care.   Mariel Aloe,  MD [facility] Faculty Attending, Center for [facility] 09/15/2020 10:07 AM

## 2020-09-16 LAB — GLUCOSE, CAPILLARY
Glucose-Capillary: 129 mg/dL — ABNORMAL HIGH (ref 70–99)
Glucose-Capillary: 142 mg/dL — ABNORMAL HIGH (ref 70–99)
Glucose-Capillary: 168 mg/dL — ABNORMAL HIGH (ref 70–99)
Glucose-Capillary: 106 mg/dL — ABNORMAL HIGH (ref 70–99)

## 2020-09-16 LAB — CBC
HCT: 29.6 % — ABNORMAL LOW (ref 36.0–46.0)
Hemoglobin: 10.4 g/dL — ABNORMAL LOW (ref 12.0–15.0)
MCH: 32 pg (ref 26.0–34.0)
MCHC: 35.1 g/dL (ref 30.0–36.0)
MCV: 91.1 fL (ref 80.0–100.0)
Platelets: 244 10*3/uL (ref 150–400)
RBC: 3.25 MIL/uL — ABNORMAL LOW (ref 3.87–5.11)
RDW: 11.8 % (ref 11.5–15.5)
WBC: 8.9 10*3/uL (ref 4.0–10.5)
nRBC: 0 % (ref 0.0–0.2)

## 2020-09-16 MED ORDER — METFORMIN HCL 500 MG PO TABS
1000.0000 mg | ORAL_TABLET | Freq: Two times a day (BID) | ORAL | Status: DC
  Administered 2020-09-16 – 2020-09-18 (×5): 1000 mg via ORAL
  Filled 2020-09-16 (×5): qty 2

## 2020-09-16 NOTE — Progress Notes (Signed)
Patient ID: Catherine Morris, female   DOB: 1981-03-05, 40 y.o.   MRN: [id number] FACULTY PRACTICE ANTEPARTUM PROGRESS NOTE  Catherine Morris is a 40 y.o. [id number] at [redacted]w[redacted]d who is admitted for significant vaginal bleeding.  Estimated Date of Delivery: 12/27/20 Fetal presentation is cephalic.  Length of Stay:  2 Days. Admitted 09/14/2020  Subjective: Patient reports doing well.  She denies any further vaginal bleeding or dark discharge. She reports normal fetal movement.  She denies uterine contractions, or leaking of fluid per vagina.  The patient denies abdominal pain. She is without any complaints  Vitals:  Blood pressure (!) 93/53, pulse 72, temperature 98.5 F (36.9 C), temperature source Oral, resp. rate 18, height 5\' 1"  (1.549 m), weight 62.9 kg, last menstrual period 03/15/2020, SpO2 99 %. Physical Examination: GENERAL: Well-developed, well-nourished female in no acute distress.  HEENT: Normocephalic, atraumatic. Sclerae anicteric.  LUNGS: Clear to auscultation bilaterally.  HEART: Regular rate and rhythm. ABDOMEN: Soft, nontender, gravid PELVIC: Not indicated EXTREMITIES: No cyanosis, clubbing, or edema, 2+ distal pulses.   Fetal monitoring: FHR: 135s bpm, Variability: moderate, Accelerations: Present, Decelerations: small variable  Uterine activity: none   Results for orders placed or performed during the hospital encounter of 09/14/20 (from the past 48 hour(s))  Wet prep, genital     Status: Abnormal   Collection Time: 09/14/20  8:45 PM  Result Value Ref Range   Yeast Wet Prep HPF POC NONE SEEN NONE SEEN   Trich, Wet Prep NONE SEEN NONE SEEN   Clue Cells Wet Prep HPF POC NONE SEEN NONE SEEN   WBC, Wet Prep HPF POC FEW (A) NONE SEEN   Sperm NONE SEEN     Comment: Performed at [facility], [address] [place], [place]  GC/Chlamydia probe amp ([place])not at [facility]     Status: None   Collection Time: 09/14/20  8:53 PM  Result Value Ref  Range   Neisseria Gonorrhea Negative    Chlamydia Negative    Comment Normal Reference [place] Chlamydia - Negative    Comment      Normal Reference Range Neisseria Gonorrhea - Negative  Resp Panel by RT-PCR (Flu A&B, Covid) Nasopharyngeal Swab     Status: None   Collection Time: 09/14/20  8:57 PM   Specimen: Nasopharyngeal Swab; Nasopharyngeal(NP) swabs in vial transport medium  Result Value Ref Range   SARS Coronavirus 2 by RT PCR NEGATIVE NEGATIVE    Comment: (NOTE) SARS-CoV-2 target nucleic acids are NOT DETECTED.  The SARS-CoV-2 RNA is generally detectable in upper respiratory specimens during the acute phase of infection. The lowest concentration of SARS-CoV-2 viral copies this assay can detect is 138 copies/mL. A negative result does not preclude SARS-Cov-2 infection and should not be used as the sole basis for treatment or other patient management decisions. A negative result may occur with  improper specimen collection/handling, submission of specimen other than nasopharyngeal swab, presence of viral mutation(s) within the areas targeted by this assay, and inadequate number of viral copies(<138 copies/mL). A negative result must be combined with clinical observations, patient history, and epidemiological information. The expected result is Negative.  Fact Sheet for Patients:  11/12/20  Fact Sheet for Healthcare Providers:  BloggerCourse.com  This test is no t yet approved or cleared by the [employer] FDA and  has been authorized for detection and/or diagnosis of SARS-CoV-2 by FDA under an Emergency Use Authorization (EUA). This EUA will remain  in effect (meaning this test can be  used) for the duration of the COVID-19 declaration under Section 564(b)(1) of the Act, 21 U.S.C.section 360bbb-3(b)(1), unless the authorization is terminated  or revoked sooner.       Influenza A by PCR NEGATIVE NEGATIVE    Influenza B by PCR NEGATIVE NEGATIVE    Comment: (NOTE) The Xpert Xpress SARS-CoV-2/FLU/RSV plus assay is intended as an aid in the diagnosis of influenza from Nasopharyngeal swab specimens and should not be used as a sole basis for treatment. Nasal washings and aspirates are unacceptable for Xpert Xpress SARS-CoV-2/FLU/RSV testing.  Fact Sheet for Patients: EntrepreneurPulse.com.au  Fact Sheet for Healthcare Providers: IncredibleEmployment.be  This test is not yet approved or cleared by the [place] FDA and has been authorized for detection and/or diagnosis of SARS-CoV-2 by FDA under an Emergency Use Authorization (EUA). This EUA will remain in effect (meaning this test can be used) for the duration of the COVID-19 declaration under Section 564(b)(1) of the Act, 21 U.S.C. section 360bbb-3(b)(1), unless the authorization is terminated or revoked.  Performed at [facility], [place] [address], [place], [place] [address]   Type and screen [place]     Status: None   Collection Time: 09/14/20  9:00 PM  Result Value Ref Range   ABO/RH(D) O POS    Antibody Screen NEG    Sample Expiration      09/17/2020,2359 Performed at [facility], [place] [address], [place], [place] [address]   Comprehensive metabolic panel     Status: Abnormal   Collection Time: 09/14/20  9:00 PM  Result Value Ref Range   Sodium 133 (L) 135 - 145 mmol/L   Potassium 3.5 3.5 - 5.1 mmol/L   Chloride 102 98 - 111 mmol/L   CO2 21 (L) 22 - 32 mmol/L   Glucose, Bld 99 70 - 99 mg/dL    Comment: Glucose reference range applies only to samples taken after fasting for at least 8 hours.   BUN 11 6 - 20 mg/dL   Creatinine, Ser 0.89 0.44 - 1.00 mg/dL   Calcium 8.9 8.9 - 10.3 mg/dL   Total Protein 6.5 6.5 - 8.1 g/dL   Albumin 3.0 (L) 3.5 - 5.0 g/dL   AST 15 15 - 41 U/L   ALT 14 0 - 44 U/L   Alkaline Phosphatase 70 38 - 126 U/L   Total Bilirubin 0.4  0.3 - 1.2 mg/dL   GFR, Estimated >60 >60 mL/min    Comment: (NOTE) Calculated using the CKD-EPI Creatinine Equation (2021)    Anion gap 10 5 - 15    Comment: Performed at [place] [address] [place], [place] [address]  CBC on admission     Status: Abnormal   Collection Time: 09/14/20  9:00 PM  Result Value Ref Range   WBC 11.0 (H) 4.0 - 10.5 K/uL   RBC 3.41 (L) 3.87 - 5.11 MIL/uL   Hemoglobin 10.8 (L) 12.0 - 15.0 g/dL   HCT 31.2 (L) 36.0 - 46.0 %   MCV 91.5 80.0 - 100.0 fL   MCH 31.7 26.0 - 34.0 pg   MCHC 34.6 30.0 - 36.0 g/dL   RDW 11.8 11.5 - 15.5 %   Platelets 267 150 - 400 K/uL   nRBC 0.0 0.0 - 0.2 %    Comment: Performed at [facility], [place] [address], [place], [place] [address]  CBC     Status: Abnormal   Collection Time: 09/15/20  4:44  AM  Result Value Ref Range   WBC 8.7 4.0 - 10.5 K/uL   RBC 3.26 (L) 3.87 - 5.11 MIL/uL   Hemoglobin 10.5 (L) 12.0 - 15.0 g/dL   HCT 54.6 (L) 27.0 - 35.0 %   MCV 91.7 80.0 - 100.0 fL   MCH 32.2 26.0 - 34.0 pg   MCHC 35.1 30.0 - 36.0 g/dL   RDW 09.3 81.8 - 29.9 %   Platelets 245 150 - 400 K/uL   nRBC 0.0 0.0 - 0.2 %    Comment: Performed at [facility], [address] [place], [place] [address]  Glucose, capillary     Status: Abnormal   Collection Time: 09/15/20  8:34 AM  Result Value Ref Range   Glucose-Capillary 126 (H) 70 - 99 mg/dL    Comment: Glucose reference range applies only to samples taken after fasting for at least 8 hours.  CBC     Status: Abnormal   Collection Time: 09/16/20  4:39 AM  Result Value Ref Range   WBC 8.9 4.0 - 10.5 K/uL   RBC 3.25 (L) 3.87 - 5.11 MIL/uL   Hemoglobin 10.4 (L) 12.0 - 15.0 g/dL   HCT 67.8 (L) 93.8 - 10.1 %   MCV 91.1 80.0 - 100.0 fL   MCH 32.0 26.0 - 34.0 pg   MCHC 35.1 30.0 - 36.0 g/dL   RDW 75.1 02.5 - 85.2 %   Platelets 244 150 - 400 K/uL   nRBC 0.0 0.0 - 0.2 %    Comment: Performed at [facility], [address], [place], [place] [address]     I have reviewed the patient's current medications.  ASSESSMENT: Active Problems:   Vaginal bleeding in pregnancy   PLAN: Maternal fetal status stable without further episodes of vaginal bleeding. Patient with Type 2 DM on metformin with elevated fasting likely secondary to recent steroid administration. Will increase metformin to 1000 mg BID and continue close monitoring Monitor in house 5-7 days Continue routine antenatal care.   Catalina Antigua, MD [facility] Faculty Attending, Center for [facility] 09/16/2020 7:32 AM

## 2020-09-17 LAB — GLUCOSE, CAPILLARY
Glucose-Capillary: 133 mg/dL — ABNORMAL HIGH (ref 70–99)
Glucose-Capillary: 112 mg/dL — ABNORMAL HIGH (ref 70–99)
Glucose-Capillary: 103 mg/dL — ABNORMAL HIGH (ref 70–99)
Glucose-Capillary: 78 mg/dL (ref 70–99)

## 2020-09-17 NOTE — Progress Notes (Signed)
FACULTY PRACTICE ANTEPARTUM PROGRESS NOTE  Catherine Morris is a 40 y.o. [id number] at [redacted]w[redacted]d who is admitted for vaginal bleeding.  Estimated Date of Delivery: 12/27/20 Fetal presentation is cephalic.  Length of Stay:  3 Days. Admitted 09/14/2020  Subjective: Pt seen, doing well.  No acute complaints. Patient reports normal fetal movement.  She denies uterine contractions, denies bleeding and leaking of fluid per vagina.  Vitals:  Blood pressure (!) 92/54, pulse 71, temperature 98.3 F (36.8 C), temperature source Oral, resp. rate 17, height 5\' 1"  (1.549 m), weight 62.9 kg, last menstrual period 03/15/2020, SpO2 97 %. Physical Examination: CONSTITUTIONAL: Well-developed, well-nourished female in no acute distress.  HENT:  Normocephalic, atraumatic, External right and left ear normal. Oropharynx is clear and moist EYES: Conjunctivae and EOM are normal.  NECK: Normal range of motion, supple, no masses. SKIN: Skin is warm and dry. No rash noted. Not diaphoretic. No erythema. No pallor. NEUROLGIC: Alert and oriented to person, place, and time. Normal reflexes, muscle tone coordination. No cranial nerve deficit noted. PSYCHIATRIC: Normal mood and affect. Normal behavior. Normal judgment and thought content. CARDIOVASCULAR: Normal heart rate noted, regular rhythm RESPIRATORY: Effort and breath sounds normal, no problems with respiration noted MUSCULOSKELETAL: Normal range of motion. No edema and no tenderness. ABDOMEN: Soft, nontender, nondistended, gravid. CERVIX: deferred  Fetal monitoring: FHR: 140 bpm, Variability: moderate, Accelerations: Present, Decelerations: Absent , category 1 strip, appropriate for gestational age. Uterine activity: absent  Results for orders placed or performed during the hospital encounter of 09/14/20 (from the past 48 hour(s))  Glucose, capillary     Status: Abnormal   Collection Time: 09/15/20  7:39 PM  Result Value Ref Range   Glucose-Capillary 129 (H)  70 - 99 mg/dL    Comment: Glucose reference range applies only to samples taken after fasting for at least 8 hours.  CBC     Status: Abnormal   Collection Time: 09/16/20  4:39 AM  Result Value Ref Range   WBC 8.9 4.0 - 10.5 K/uL   RBC 3.25 (L) 3.87 - 5.11 MIL/uL   Hemoglobin 10.4 (L) 12.0 - 15.0 g/dL   HCT 11/14/20 (L) 69.6 - 78.9 %   MCV 91.1 80.0 - 100.0 fL   MCH 32.0 26.0 - 34.0 pg   MCHC 35.1 30.0 - 36.0 g/dL   RDW 38.1 01.7 - 51.0 %   Platelets 244 150 - 400 K/uL   nRBC 0.0 0.0 - 0.2 %    Comment: Performed at [facility], [address] [place], [place]  Glucose, capillary     Status: Abnormal   Collection Time: 09/16/20  6:30 AM  Result Value Ref Range   Glucose-Capillary 142 (H) 70 - 99 mg/dL    Comment: Glucose reference range applies only to samples taken after fasting for at least 8 hours.  Glucose, capillary     Status: Abnormal   Collection Time: 09/16/20  9:01 AM  Result Value Ref Range   Glucose-Capillary 168 (H) 70 - 99 mg/dL    Comment: Glucose reference range applies only to samples taken after fasting for at least 8 hours.   Comment 1 Notify RN    Comment 2 Document in Chart   Glucose, capillary     Status: Abnormal   Collection Time: 09/16/20  2:34 PM  Result Value Ref Range   Glucose-Capillary 106 (H) 70 - 99 mg/dL    Comment: Glucose reference range applies only to samples taken after fasting for at  least 8 hours.   Comment 1 Notify RN    Comment 2 Document in Chart   Glucose, capillary     Status: Abnormal   Collection Time: 09/16/20  8:28 PM  Result Value Ref Range   Glucose-Capillary 133 (H) 70 - 99 mg/dL    Comment: Glucose reference range applies only to samples taken after fasting for at least 8 hours.    I have reviewed the patient's current medications.  ASSESSMENT: Active Problems:   Vaginal bleeding in pregnancy   PLAN: No further recurrence of vaginal bleeding Anticipate d/c home on 09/18/20 Fasting blood sugars still  slightly elevated, may still be remnant of betamethasone Postprandial glucose were close to in range at last check.  Will follow.   Continue routine antenatal care.   Mariel Aloe, MD [facility] Faculty Attending, Center for [facility] 09/17/2020 9:57 AM

## 2020-09-17 NOTE — Progress Notes (Signed)
2 hr PP CBG 103.

## 2020-09-18 ENCOUNTER — Other Ambulatory Visit (HOSPITAL_COMMUNITY): Payer: Self-pay | Admitting: Obstetrics and Gynecology

## 2020-09-18 DIAGNOSIS — E119 Type 2 diabetes mellitus without complications: Secondary | ICD-10-CM

## 2020-09-18 DIAGNOSIS — O24112 Pre-existing diabetes mellitus, type 2, in pregnancy, second trimester: Secondary | ICD-10-CM

## 2020-09-18 LAB — GLUCOSE, CAPILLARY
Glucose-Capillary: 83 mg/dL (ref 70–99)
Glucose-Capillary: 73 mg/dL (ref 70–99)
Glucose-Capillary: 70 mg/dL (ref 70–99)

## 2020-09-18 MED ORDER — METFORMIN HCL 1000 MG PO TABS: 1000.0000 mg | ORAL_TABLET | Freq: Two times a day (BID) | ORAL | 3 refills | Status: DC

## 2020-09-18 MED FILL — METFORMIN HCL 1000 MG TABS: 1000 | 30 days supply | Qty: 60 | Fill #0

## 2020-09-18 NOTE — Progress Notes (Addendum)
Discharge instructions and prescriptions given to pt. Discussed signs and symptoms to report to the MD, upcoming appointments, and meds. Pt verbalizes understanding and has no questions or concerns at this time. Pt discharged home from hospital in stable condition. 

## 2020-09-18 NOTE — Discharge Instructions (Signed)
Vaginal Bleeding During Pregnancy, Second Trimester  A small amount of bleeding (spotting) from the vagina is common during pregnancy. Sometimes the bleeding is normal and is not a sign of problems. In some other cases, it is a sign of something serious. Tell your doctor right away if there is any bleeding from your vagina. Follow these instructions at home: Activity  Follow your doctor's instructions about how active you can be.  If needed, make plans for someone to help with your normal activities.  Do not exercise or do activities that take a lot of effort until your doctor says that this is safe.  Do not lift anything that is heavier than 10 lb (4.5 kg) until your doctor says that this is safe.  Do not have sex or orgasms until your doctor says that this is safe. Medicines  Take over-the-counter and prescription medicines only as told by your doctor.  Do not take aspirin. It can cause bleeding. General instructions  Watch your condition for any changes.  Write down: ? The number of pads you use each day. ? How often you change pads. ? How soaked your pads are.  Do not use tampons.  Do not douche.  If you pass any tissue from your vagina, save it to show to your doctor.  Keep all follow-up visits as told by your doctor. This is important. Contact a doctor if:  You have bleeding in the vagina at any time during pregnancy.  You have cramps.  You have a fever that does not get better with medicine. Get help right away if:  You have very bad cramps in your back or belly (abdomen).  You have contractions.  You have chills.  You pass large clots or a lot of tissue from your vagina.  Your bleeding gets worse.  You feel light-headed.  You feel weak.  You pass out (faint).  You are leaking fluid from your vagina.  You have a gush of fluid from your vagina. Summary  Sometimes vaginal bleeding during pregnancy is normal and is not a problem. Sometimes it may  be a sign of something serious.  Tell your doctor about any bleeding from your vagina right away.  Follow your doctor's instructions about how active you can be. You may need someone to help you with your normal activities. This information is not intended to replace advice given to you by your health care provider. Make sure you discuss any questions you have with your health care provider. Document Revised: 12/18/2018 Document Reviewed: 11/30/2016 Elsevier Patient Education  2020 [employer]  

## 2020-09-18 NOTE — Discharge Summary (Addendum)
Antenatal Physician Discharge Summary  Patient ID: Catherine Morris MRN: [id number] DOB/AGE: 38/17/1982 37 y.o.  Admit date: 09/14/2020 Discharge date: 09/18/2020  Admission Diagnoses: vaginal bleeding in second trimester, preexisting type 2 diabetes mellitus in second trimester  Discharge Diagnoses: vaginal bleeding in second trimester, preexisting type 2 diabetes mellitus in second trimester   Prenatal Procedures: ultrasound and betamethasone  Consults: n/a  Hospital Course:  Catherine Morris is a 40 y.o. [id number] with IUP at [redacted]w[redacted]d admitted for significant vaginal bleeding.  The bleeding was painless and ultrasound did not show any abruption or previa.  The patient was admitted for prolonged monitoring and steroids for fetal lung maturity in case she needed to be delivered.  Pt did well throughout her stay other than slightly higher recorded blood sugars, but this may have been a result of the steroid injections.Her metformin was increased to compensate.  She was deemed stable for discharge to home with outpatient follow up.  Discharge Exam: Temp:  [97.6 F (36.4 C)-98.3 F (36.8 C)] 97.6 F (36.4 C) (01/07 0805) Pulse Rate:  [69-91] 91 (01/07 0805) Resp:  [16-18] 16 (01/07 0805) BP: (92-103)/(54-62) 100/62 (01/07 0805) SpO2:  [98 %-100 %] 100 % (01/07 0805) Physical Examination: CONSTITUTIONAL: Well-developed, well-nourished female in no acute distress.  HENT:  Normocephalic, atraumatic, External right and left ear normal. Oropharynx is clear and moist EYES: Conjunctivae and EOM are normal.  NECK: Normal range of motion, supple, no masses SKIN: Skin is warm and dry. No rash noted. Not diaphoretic. No erythema. No pallor. NEUROLGIC: Alert and oriented to person, place, and time. Normal reflexes, muscle tone coordination. No cranial nerve deficit noted. PSYCHIATRIC: Normal mood and affect. Normal behavior. Normal judgment and thought content. CARDIOVASCULAR: Normal  heart rate noted, regular rhythm RESPIRATORY: Effort and breath sounds normal, no problems with respiration noted MUSCULOSKELETAL: Normal range of motion. No edema and no tenderness. 2+ distal pulses. ABDOMEN: Soft, nontender, nondistended, gravid. CERVIX:  deferred, no vaginal bleeding 09/18/20  NST: baseline 140s, marked variability, accels noted, no decels Toco: none Significant Diagnostic Studies:  Results for orders placed or performed during the hospital encounter of 09/14/20 (from the past 168 hour(s))  Wet prep, genital   Collection Time: 09/14/20  8:45 PM  Result Value Ref Range   Yeast Wet Prep HPF POC NONE SEEN NONE SEEN   Trich, Wet Prep NONE SEEN NONE SEEN   Clue Cells Wet Prep HPF POC NONE SEEN NONE SEEN   WBC, Wet Prep HPF POC FEW (A) NONE SEEN   Sperm NONE SEEN   GC/Chlamydia probe amp ()not at [facility]   Collection Time: 09/14/20  8:53 PM  Result Value Ref Range   Neisseria Gonorrhea Negative    Chlamydia Negative    Comment Normal Reference Ranger Chlamydia - Negative    Comment      Normal Reference Range Neisseria Gonorrhea - Negative  Resp Panel by RT-PCR (Flu A&B, Covid) Nasopharyngeal Swab   Collection Time: 09/14/20  8:57 PM   Specimen: Nasopharyngeal Swab; Nasopharyngeal(NP) swabs in vial transport medium  Result Value Ref Range   SARS Coronavirus 2 by RT PCR NEGATIVE NEGATIVE   Influenza A by PCR NEGATIVE NEGATIVE   Influenza B by PCR NEGATIVE NEGATIVE  Comprehensive metabolic panel   Collection Time: 09/14/20  9:00 PM  Result Value Ref Range   Sodium 133 (L) 135 - 145 mmol/L   Potassium 3.5 3.5 - 5.1 mmol/L   Chloride 102 98 - 111 mmol/L   CO2 21 (  L) 22 - 32 mmol/L   Glucose, Bld 99 70 - 99 mg/dL   BUN 11 6 - 20 mg/dL   Creatinine, Ser 4.40 0.44 - 1.00 mg/dL   Calcium 8.9 8.9 - 10.2 mg/dL   Total Protein 6.5 6.5 - 8.1 g/dL   Albumin 3.0 (L) 3.5 - 5.0 g/dL   AST 15 15 - 41 U/L   ALT 14 0 - 44 U/L   Alkaline Phosphatase 70 38 - 126 U/L    Total Bilirubin 0.4 0.3 - 1.2 mg/dL   GFR, Estimated >72 >53 mL/min   Anion gap 10 5 - 15  CBC on admission   Collection Time: 09/14/20  9:00 PM  Result Value Ref Range   WBC 11.0 (H) 4.0 - 10.5 K/uL   RBC 3.41 (L) 3.87 - 5.11 MIL/uL   Hemoglobin 10.8 (L) 12.0 - 15.0 g/dL   HCT 66.4 (L) 40.3 - 47.4 %   MCV 91.5 80.0 - 100.0 fL   MCH 31.7 26.0 - 34.0 pg   MCHC 34.6 30.0 - 36.0 g/dL   RDW 25.9 56.3 - 87.5 %   Platelets 267 150 - 400 K/uL   nRBC 0.0 0.0 - 0.2 %  Type and screen [facility]   Collection Time: 09/14/20  9:00 PM  Result Value Ref Range   ABO/RH(D) O POS    Antibody Screen NEG    Sample Expiration      09/17/2020,2359 Performed at [facility], [address] [place], [place] [address]   CBC   Collection Time: 09/15/20  4:44 AM  Result Value Ref Range   WBC 8.7 4.0 - 10.5 K/uL   RBC 3.26 (L) 3.87 - 5.11 MIL/uL   Hemoglobin 10.5 (L) 12.0 - 15.0 g/dL   HCT 95.1 (L) 88.4 - 16.6 %   MCV 91.7 80.0 - 100.0 fL   MCH 32.2 26.0 - 34.0 pg   MCHC 35.1 30.0 - 36.0 g/dL   RDW 06.3 01.6 - 01.0 %   Platelets 245 150 - 400 K/uL   nRBC 0.0 0.0 - 0.2 %  Glucose, capillary   Collection Time: 09/15/20  8:34 AM  Result Value Ref Range   Glucose-Capillary 126 (H) 70 - 99 mg/dL  Glucose, capillary   Collection Time: 09/15/20  7:39 PM  Result Value Ref Range   Glucose-Capillary 129 (H) 70 - 99 mg/dL  CBC   Collection Time: 09/16/20  4:39 AM  Result Value Ref Range   WBC 8.9 4.0 - 10.5 K/uL   RBC 3.25 (L) 3.87 - 5.11 MIL/uL   Hemoglobin 10.4 (L) 12.0 - 15.0 g/dL   HCT 93.2 (L) 35.5 - 73.2 %   MCV 91.1 80.0 - 100.0 fL   MCH 32.0 26.0 - 34.0 pg   MCHC 35.1 30.0 - 36.0 g/dL   RDW 20.2 54.2 - 70.6 %   Platelets 244 150 - 400 K/uL   nRBC 0.0 0.0 - 0.2 %  Glucose, capillary   Collection Time: 09/16/20  6:30 AM  Result Value Ref Range   Glucose-Capillary 142 (H) 70 - 99 mg/dL  Glucose, capillary   Collection Time: 09/16/20  9:01 AM  Result Value Ref  Range   Glucose-Capillary 168 (H) 70 - 99 mg/dL   Comment 1 Notify RN    Comment 2 Document in Chart   Glucose, capillary   Collection Time: 09/16/20  2:34 PM  Result Value Ref Range   Glucose-Capillary 106 (H) 70 -  99 mg/dL   Comment 1 Notify RN    Comment 2 Document in Chart   Glucose, capillary   Collection Time: 09/16/20  8:28 PM  Result Value Ref Range   Glucose-Capillary 133 (H) 70 - 99 mg/dL  Glucose, capillary   Collection Time: 09/17/20  9:31 AM  Result Value Ref Range   Glucose-Capillary 103 (H) 70 - 99 mg/dL  Glucose, capillary   Collection Time: 09/17/20  1:37 PM  Result Value Ref Range   Glucose-Capillary 78 70 - 99 mg/dL  Results for orders placed or performed in visit on 09/15/20 (from the past 168 hour(s))  Glucose, capillary   Collection Time: 09/15/20 11:04 AM  Result Value Ref Range   Glucose-Capillary 128 (H) 70 - 99 mg/dL  Glucose, capillary   Collection Time: 09/15/20  2:11 PM  Result Value Ref Range   Glucose-Capillary 110 (H) 70 - 99 mg/dL  Glucose, capillary   Collection Time: 09/17/20  6:35 AM  Result Value Ref Range   Glucose-Capillary 112 (H) 70 - 99 mg/dL   [place] MFM OB FOLLOW UP  Result Date: 09/01/2020 ----------------------------------------------------------------------  OBSTETRICS REPORT                       (Signed Final 09/01/2020 02:42 pm) ---------------------------------------------------------------------- Patient Info  ID #:       [id number]                          D.O.B.:  1981/04/13 (39 yrs)  Name:       Catherine Morris-               Visit Date: 09/01/2020 11:17 am              HERNANDEZ ---------------------------------------------------------------------- Performed By  Attending:        Noralee Space MD        Ref. Address:     [address]                                                             [place], [place]                                                              [address]  Performed By:     Tommie Raymond BS,       [facility]  Referred By:      Adam Phenix                    MD ---------------------------------------------------------------------- Orders  #  Description                           Code        Ordered By  1  [place] MFM OB FOLLOW UP                   [id number]    YU FANG ----------------------------------------------------------------------  #  Order #                     Accession #                Episode #  1  [id number]                   [id number]                 [id number] ---------------------------------------------------------------------- Indications  [redacted] weeks gestation of pregnancy                Z3A.23  Pre-existing diabetes, type 2, in pregnancy,   O24.112  second trimester (Metformin)  Advanced maternal age multigravida 74+,        O64.522  second trimester  Medical complication of pregnancy (Bipolar,    O26.90  Anxiety, Depression)  Subchorionic hemorrhage, antepartum            O45.90  Poor obstetric history: Previous preterm       O09.219  delivery, antepartum  LR NIPS  Encounter for other antenatal screening        Z36.2  follow-up ---------------------------------------------------------------------- Fetal Evaluation  Num Of Fetuses:         1  Fetal Heart Rate(bpm):  137  Cardiac Activity:       Observed  Presentation:           Cephalic  Placenta:               Posterior Fundal  P. Cord Insertion:      Previously Visualized  Amniotic Fluid  AFI FV:      Within normal limits                              Largest Pocket(cm)                              5.4 ---------------------------------------------------------------------- Biometry  BPD:      62.9  mm  G. Age:  25w 3d         98  %    CI:        81.52   %     70 - 86                                                          FL/HC:      18.8   %    19.2 - 20.8  HC:      219.9  mm     G. Age:  24w 0d         64  %    HC/AC:      1.12        1.05 - 1.21  AC:      195.6  mm     G. Age:  24w 2d         72  %    FL/BPD:     65.8   %    71 - 87  FL:       41.4  mm     G. Age:  23w 3d         43  %    FL/AC:      21.2   %    20 - 24  CER:      25.5  mm     G. Age:  23w 1d         69  %  LV:        3.8  mm  CM:        6.7  mm  Est. FW:     649  gm      1 lb 7 oz     76  % ---------------------------------------------------------------------- OB History  Blood Type:   O+  Gravidity:    3  Living:       2 ---------------------------------------------------------------------- Gestational Age  LMP:           24w 2d        Date:  03/15/20                 EDD:   12/20/20  U/S Today:     24w 2d                                        EDD:   12/20/20  Best:          23w 2d     Det. By:  Marcella Dubs         EDD:   12/27/20 ---------------------------------------------------------------------- Anatomy  Cranium:               Appears normal         LVOT:                   Appears normal  Cavum:                 Appears normal         Aortic Arch:            Appears normal  Ventricles:  Appears normal         Ductal Arch:            Appears normal  Choroid Plexus:        Appears normal         Diaphragm:              Appears normal  Cerebellum:            Appears normal         Stomach:                Appears normal, left                                                                        sided  Posterior Fossa:       Appears normal         Abdomen:                Appears normal  Nuchal Fold:           Previously seen        Abdominal Wall:         Previously seen  Face:                  Appears normal         Cord Vessels:           Appears normal (3                         (orbits and profile)                           vessel cord)  Lips:                  Appears normal          Kidneys:                Appear normal  Palate:                Not well visualized    Bladder:                Appears normal  Thoracic:              Previously seen        Spine:                  Appears normal                         Appears normal  Heart:                 Appears normal         Upper Extremities:      Previously seen                         (4CH, axis, and                         situs)  RVOT:  Appears normal         Lower Extremities:      Previously seen  Other:  Hands and feet visualized previously. Female gender previously seen.          Heels and 5th digit visualized previously. ---------------------------------------------------------------------- Cervix Uterus Adnexa  Cervix  Length:            2.7  cm.  Normal appearance by transabdominal scan.  Uterus  No abnormality visualized.  Right Ovary  Not visualized.  Left Ovary  Not visualized.  Cul De Sac  No free fluid seen.  Adnexa  No adnexal mass visualized. ---------------------------------------------------------------------- Impression  Gestational diabetes.  Patient returned for completion of fetal  anatomy.  She had fetal echocardiography that was reported  as normal.  Fetal growth is appropriate for gestational age .Amniotic fluid  is normal and good fetal activity is seen .Fetal anatomical  survey was completed and appears normal.  We reassured the patient of the findings ---------------------------------------------------------------------- Recommendations  -An appointment was made for her to return in 4 weeks for  fetal growth assessment. ----------------------------------------------------------------------                  Noralee Spaceavi Shankar, MD Electronically Signed Final Report   09/01/2020 02:42 pm ----------------------------------------------------------------------  US MFM OB LIMITED  Result Date: 09/15/2020 ----------------------------------------------------------------------  OBSTETRICS REPORT                        (Signed Final 09/15/2020 04:46 pm) ---------------------------------------------------------------------- Patient Info  ID #:       [id number]                          D.O.B.:  24-Oct-1980 (39 yrs)  Name:       Catherine OverlieODELINA RAMIREZ-               Visit Date: 09/14/2020 09:03 pm              HERNANDEZ ---------------------------------------------------------------------- Performed By  Attending:        Lin Landsmanorenthian Booker      Ref. Address:     [address]                    [place]                                                             [address]                                                             [place], [place]                                                             [phone]  Performed By:     Reinaldo RaddleBrenda Shaw            [facility]                    [place]  Referred By:      Woodroe Mode                    MD ---------------------------------------------------------------------- Orders  #  Description                           Code        Ordered By  1  [place] MFM OB LIMITED                     [phone]    NICOLE NUGENT ----------------------------------------------------------------------  #  Order #                     Accession #                Episode #  1  [id number]                   [id number]                 [id number] ---------------------------------------------------------------------- Indications  [redacted] weeks gestation of pregnancy                Z3A.25  Pre-existing diabetes, type 2, in pregnancy,   O24.112  second trimester (Metformin)  Advanced maternal age multigravida 45+,        O62.522  second trimester  Medical complication of pregnancy (Bipolar,    O26.90  Anxiety, Depression)  Subchorionic hemorrhage, antepartum            O45.90  Poor obstetric history: Previous preterm       O09.219  delivery, antepartum  LR NIPS ---------------------------------------------------------------------- Fetal Evaluation  Num Of Fetuses:         1   Fetal Heart Rate(bpm):  138  Cardiac Activity:       Observed  Presentation:           Cephalic  Placenta:               Posterior Fundal  P. Cord Insertion:      Visualized, central  Amniotic Fluid  AFI FV:      Within normal limits                              Largest Pocket(cm)                              5.3 ---------------------------------------------------------------------- OB History  Blood Type:   O+  Gravidity:    3  Living:       2 ---------------------------------------------------------------------- Gestational Age  LMP:           26w 1d        Date:  03/15/20                 EDD:   12/20/20  Best:          25w 1d     Det. By:  Loman Chroman  EDD:   12/27/20 ---------------------------------------------------------------------- Anatomy  Ventricles:            Appears normal         Kidneys:                Appear normal  Stomach:               Appears normal, left   Bladder:                Appears normal                         sided ---------------------------------------------------------------------- Cervix Uterus Adnexa  Cervix  Length:           4.66  cm.  Normal appearance by transabdominal scan. ---------------------------------------------------------------------- Impression  Limited exam secondary to type 2 diabetes and vaginal  bleeding.  Good fetal movement and amniotic fluid observed.  No evidence of placenta previa or abruption. ---------------------------------------------------------------------- Recommendations  Clinical correlation recommended. ----------------------------------------------------------------------               Lin Landsmanorenthian Booker, MD Electronically Signed Final Report   09/15/2020 04:46 pm ----------------------------------------------------------------------   Future Appointments  Date Time Provider Department Center  09/29/2020  8:35 AM Venora MaplesEckstat, Matthew M, [facility]  09/29/2020 10:30 AM [facility]  09/29/2020 10:45 AM [facility]  [facility]    Discharge Condition: Stable  Discharge disposition: 01-Home or Self Care       Discharge Instructions    Discharge activity:   Complete by: As directed    Pt off work until seen for follow up visit in the office.  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  If it is less than 5 times, get up, move around and drink some juice.  Repeat the test 30 minutes later.  If it is still less than 5 kicks in an hour, notify your doctor.   Complete by: As directed    Notify physician for a general feeling that "something is not right"   Complete by: As directed    Notify physician for increase or change in vaginal discharge   Complete by: As directed    Notify physician for intestinal cramps, with or without diarrhea, sometimes described as "gas pain"   Complete by: As directed    Notify physician for leaking of fluid   Complete by: As directed    Notify physician for low, dull backache, unrelieved by heat or Tylenol   Complete by: As directed    Notify physician for menstrual like cramps   Complete by: As directed    Notify physician for pelvic pressure   Complete by: As directed    Notify physician for uterine contractions.  These may be painless and feel like the uterus is tightening or the baby is  "balling up"   Complete by: As directed    Notify physician for vaginal bleeding   Complete by: As directed    PRETERM LABOR:  Includes any of the follwing symptoms that occur between 20 - [redacted] weeks gestation.  If these symptoms are not stopped, preterm labor can  result in preterm delivery, placing your baby at risk   Complete by: As directed    Sexual Activity:     Complete by: As directed     Pelvic rest 4 weeks     Allergies as of 09/18/2020   No Known Allergies     Medication List    STOP taking these medications   cyclobenzaprine 5 MG tablet Commonly known as: FLEXERIL   Prenatal Vitamins 28-0.8 MG Tabs     TAKE these medications   aspirin EC 81 MG tablet Take 1 tablet (81 mg total) by mouth daily.   metFORMIN 1000 MG tablet Commonly known as: GLUCOPHAGE Take 1 tablet (1,000 mg total) by mouth 2 (two) times daily with a meal. What changed:   how much to take  when to take this   pantoprazole 20 MG tablet Commonly known as: Protonix Take 1 tablet (20 mg total) by mouth daily.   promethazine 25 MG tablet Commonly known as: PHENERGAN Take 1 tablet (25 mg total) by mouth every 6 (six) hours as needed for nausea or vomiting.   VITAMIN D PO Take by mouth daily.       Follow-up Information    [facility]. Schedule an appointment as soon as possible for a visit in 1 week(s).   Specialty: Obstetrics and Gynecology Why: hospital follow up for vaginal bleeding and glucose check Contact information: [address] [place] [address] [phone]              Total discharge time: 15 minutes   Signed: Warden Fillers M.D. 09/18/2020, 8:57 AM

## 2020-09-18 NOTE — Progress Notes (Signed)
FACULTY PRACTICE ANTEPARTUM PROGRESS NOTE  Catherine Morris is a 40 y.o. [id number] at [redacted]w[redacted]d who is admitted for significant vaginal bleeding.  Estimated Date of Delivery: 12/27/20 Fetal presentation is cephalic.  Length of Stay:  4 Days. Admitted 09/14/2020  Subjective: Pt seen, doing well.  No acute complaints Patient reports normal fetal movement.  She denies uterine contractions, denies bleeding and leaking of fluid per vagina.  Vitals:  Blood pressure 100/62, pulse 91, temperature 97.6 F (36.4 C), temperature source Oral, resp. rate 16, height 5\' 1"  (1.549 m), weight 62.9 kg, last menstrual period 03/15/2020, SpO2 100 %. Physical Examination: CONSTITUTIONAL: Well-developed, well-nourished female in no acute distress.  HENT:  Normocephalic, atraumatic, External right and left ear normal. Oropharynx is clear and moist EYES: Conjunctivae and EOM are normal. NECK: Normal range of motion, supple, no masses. SKIN: Skin is warm and dry. No rash noted. Not diaphoretic. No erythema. No pallor. NEUROLGIC: Alert and oriented to person, place, and time. Normal reflexes, muscle tone coordination. No cranial nerve deficit noted. PSYCHIATRIC: Normal mood and affect. Normal behavior. Normal judgment and thought content. CARDIOVASCULAR: Normal heart rate noted, regular rhythm RESPIRATORY: Effort and breath sounds normal, no problems with respiration noted MUSCULOSKELETAL: Normal range of motion. No edema and no tenderness. ABDOMEN: Soft, nontender, nondistended, gravid. CERVIX: deferred  Fetal monitoring: FHR: 130 bpm, Variability: marked, Accelerations: Present, Decelerations: Absent  Uterine activity: none   Results for orders placed or performed during the hospital encounter of 09/14/20 (from the past 48 hour(s))  Glucose, capillary     Status: Abnormal   Collection Time: 09/16/20  9:01 AM  Result Value Ref Range   Glucose-Capillary 168 (H) 70 - 99 mg/dL    Comment: Glucose reference  range applies only to samples taken after fasting for at least 8 hours.   Comment 1 Notify RN    Comment 2 Document in Chart   Glucose, capillary     Status: Abnormal   Collection Time: 09/16/20  2:34 PM  Result Value Ref Range   Glucose-Capillary 106 (H) 70 - 99 mg/dL    Comment: Glucose reference range applies only to samples taken after fasting for at least 8 hours.   Comment 1 Notify RN    Comment 2 Document in Chart   Glucose, capillary     Status: Abnormal   Collection Time: 09/16/20  8:28 PM  Result Value Ref Range   Glucose-Capillary 133 (H) 70 - 99 mg/dL    Comment: Glucose reference range applies only to samples taken after fasting for at least 8 hours.  Glucose, capillary     Status: Abnormal   Collection Time: 09/17/20  9:31 AM  Result Value Ref Range   Glucose-Capillary 103 (H) 70 - 99 mg/dL    Comment: Glucose reference range applies only to samples taken after fasting for at least 8 hours.  Glucose, capillary     Status: None   Collection Time: 09/17/20  1:37 PM  Result Value Ref Range   Glucose-Capillary 78 70 - 99 mg/dL    Comment: Glucose reference range applies only to samples taken after fasting for at least 8 hours.    I have reviewed the patient's current medications.  ASSESSMENT: Active Problems:   Vaginal bleeding in pregnancy   PLAN: 25.5 weeks s/p significant vaginal bleeding. Pt has had no bleeding since admission. She denies uterine activity. Fasting blood sugars slightly elevated.  Postprandial glucose within range. Discharge home today.  11/15/20, MD [facility] Faculty Attending, [facility] 09/18/2020 8:45 AM

## 2020-09-21 ENCOUNTER — Telehealth: Payer: Self-pay

## 2020-09-21 NOTE — Telephone Encounter (Signed)
Calculated the patient's GFE for their upcoming appointment.   Patient will receive it via MyChart.

## 2020-09-23 ENCOUNTER — Ambulatory Visit (INDEPENDENT_AMBULATORY_CARE_PROVIDER_SITE_OTHER): Payer: Self-pay | Admitting: Obstetrics and Gynecology

## 2020-09-23 ENCOUNTER — Encounter: Payer: Self-pay | Admitting: Family Medicine

## 2020-09-23 ENCOUNTER — Other Ambulatory Visit: Payer: Self-pay

## 2020-09-23 VITALS — BP 103/73 | HR 102 | Wt 129.6 lb

## 2020-09-23 DIAGNOSIS — Z789 Other specified health status: Secondary | ICD-10-CM

## 2020-09-23 DIAGNOSIS — O24111 Pre-existing diabetes mellitus, type 2, in pregnancy, first trimester: Secondary | ICD-10-CM

## 2020-09-23 DIAGNOSIS — O4690 Antepartum hemorrhage, unspecified, unspecified trimester: Secondary | ICD-10-CM

## 2020-09-23 DIAGNOSIS — O0990 Supervision of high risk pregnancy, unspecified, unspecified trimester: Secondary | ICD-10-CM

## 2020-09-23 DIAGNOSIS — Z3A26 26 weeks gestation of pregnancy: Secondary | ICD-10-CM

## 2020-09-23 DIAGNOSIS — M5430 Sciatica, unspecified side: Secondary | ICD-10-CM

## 2020-09-23 DIAGNOSIS — O09522 Supervision of elderly multigravida, second trimester: Secondary | ICD-10-CM

## 2020-09-23 NOTE — Progress Notes (Signed)
Prenatal Visit Note Date: 09/23/2020 Clinic: [facility]  Subjective:  Catherine Morris is a 40 y.o. [id number] at [redacted]w[redacted]d being seen today for ongoing prenatal care.  She is currently monitored for the following issues for this high-risk pregnancy and has Anxiety and depression; Chronic headache; Bilateral knee pain; Intractable chronic migraine without aura and without status migrainosus; Vitamin D deficiency; Supervision of high risk pregnancy, antepartum; Language barrier; AMA (advanced maternal age) multigravida 35+; History of migraine; Gastroesophageal reflux disease; Pre-existing type 2 diabetes mellitus during pregnancy in first trimester; and Vaginal bleeding in pregnancy on their problem list.  Patient reports b/l leg discomfort.   Contractions: Not present. Vag. Bleeding: None.  Movement: Present. Denies leaking of fluid.   The following portions of the patient's history were reviewed and updated as appropriate: allergies, current medications, past family history, past medical history, past social history, past surgical history and problem list. Problem list updated.  Objective:   Vitals:   09/23/20 1112  BP: 103/73  Pulse: (!) 102  Weight: 129 lb 9.6 oz (58.8 kg)    Fetal Status: Fetal Heart Rate (bpm): 145   Movement: Present     General:  Alert, oriented and cooperative. Patient is in no acute distress.  Skin: Skin is warm and dry. No rash noted.   Cardiovascular: Normal heart rate noted  Respiratory: Normal respiratory effort, no problems with respiration noted  Abdomen: Soft, gravid, appropriate for gestational age. Pain/Pressure: Absent     Pelvic:  Cervical exam deferred        Extremities: Normal range of motion.  Edema: None  Mental Status: Normal mood and affect. Normal behavior. Normal judgment and thought content.   Urinalysis:      Assessment and Plan:  Pregnancy: G3P1102 at [redacted]w[redacted]d  1. Supervision of high risk pregnancy,  antepartum Routine care. Ask about BC nv  2. Multigravida of advanced maternal age in second trimester  3. Language barrier Interpreter used  4. Pre-existing type 2 diabetes mellitus during pregnancy in first trimester Normal CBGs on metformin 1000 bidac. Continue with current regimen. Has rpt u/s next week  5. Sciatic pain, unspecified laterality Started after getting the bmz shots last week. Sounds like it's improving. Strength and sensation normal in b/l LE. She has limp with gait. D/w her re: PT and she would like to hold off. I told her I recommend prenatal massage. Follow up s/s nv.   6. Vaginal bleeding in pregnancy None since day of admission last week  Preterm labor symptoms and general obstetric precautions including but not limited to vaginal bleeding, contractions, leaking of fluid and fetal movement were reviewed in detail with the patient. Please refer to After Visit Summary for other counseling recommendations.  Return in about 6 days (around 09/29/2020).   [place] Bing, MD

## 2020-09-28 ENCOUNTER — Encounter: Payer: Self-pay | Admitting: *Deleted

## 2020-09-28 NOTE — Progress Notes (Signed)
Patient scheduled for appointment tomorrow 09/29/20 at 8:35 am.  Office opening at 10 am due to inclement weather.  Patient called by Bud Face, Spanish interpreter.  Patient agreed to reschedule appointment to 10/07/20 at 1035 am.

## 2020-09-29 ENCOUNTER — Ambulatory Visit: Payer: Self-pay | Attending: Obstetrics and Gynecology

## 2020-09-29 ENCOUNTER — Ambulatory Visit: Payer: Self-pay | Admitting: *Deleted

## 2020-09-29 ENCOUNTER — Encounter: Payer: Self-pay | Admitting: *Deleted

## 2020-09-29 ENCOUNTER — Other Ambulatory Visit: Payer: Self-pay

## 2020-09-29 ENCOUNTER — Encounter: Payer: Self-pay | Admitting: Obstetrics & Gynecology

## 2020-09-29 ENCOUNTER — Other Ambulatory Visit: Payer: Self-pay | Admitting: *Deleted

## 2020-09-29 DIAGNOSIS — O468X2 Other antepartum hemorrhage, second trimester: Secondary | ICD-10-CM

## 2020-09-29 DIAGNOSIS — O24112 Pre-existing diabetes mellitus, type 2, in pregnancy, second trimester: Secondary | ICD-10-CM | POA: Insufficient documentation

## 2020-09-29 DIAGNOSIS — F319 Bipolar disorder, unspecified: Secondary | ICD-10-CM

## 2020-09-29 DIAGNOSIS — O99342 Other mental disorders complicating pregnancy, second trimester: Secondary | ICD-10-CM

## 2020-09-29 DIAGNOSIS — O0990 Supervision of high risk pregnancy, unspecified, unspecified trimester: Secondary | ICD-10-CM

## 2020-09-29 DIAGNOSIS — Z3A27 27 weeks gestation of pregnancy: Secondary | ICD-10-CM

## 2020-09-29 DIAGNOSIS — F418 Other specified anxiety disorders: Secondary | ICD-10-CM

## 2020-09-29 DIAGNOSIS — O09212 Supervision of pregnancy with history of pre-term labor, second trimester: Secondary | ICD-10-CM

## 2020-09-29 DIAGNOSIS — E119 Type 2 diabetes mellitus without complications: Secondary | ICD-10-CM

## 2020-09-29 DIAGNOSIS — Z7984 Long term (current) use of oral hypoglycemic drugs: Secondary | ICD-10-CM

## 2020-09-29 DIAGNOSIS — O09522 Supervision of elderly multigravida, second trimester: Secondary | ICD-10-CM

## 2020-09-29 DIAGNOSIS — O24419 Gestational diabetes mellitus in pregnancy, unspecified control: Secondary | ICD-10-CM

## 2020-09-29 NOTE — Progress Notes (Signed)
C/o"bright red vaginal bleeding spots in toilet with 3 small blood clots."

## 2020-10-07 ENCOUNTER — Ambulatory Visit (INDEPENDENT_AMBULATORY_CARE_PROVIDER_SITE_OTHER): Payer: Self-pay | Admitting: Family Medicine

## 2020-10-07 ENCOUNTER — Other Ambulatory Visit: Payer: Self-pay

## 2020-10-07 VITALS — BP 106/73 | HR 90 | Wt 130.8 lb

## 2020-10-07 DIAGNOSIS — M5432 Sciatica, left side: Secondary | ICD-10-CM

## 2020-10-07 DIAGNOSIS — Z3A28 28 weeks gestation of pregnancy: Secondary | ICD-10-CM

## 2020-10-07 DIAGNOSIS — O24111 Pre-existing diabetes mellitus, type 2, in pregnancy, first trimester: Secondary | ICD-10-CM

## 2020-10-07 DIAGNOSIS — O0990 Supervision of high risk pregnancy, unspecified, unspecified trimester: Secondary | ICD-10-CM

## 2020-10-07 DIAGNOSIS — Z789 Other specified health status: Secondary | ICD-10-CM

## 2020-10-07 DIAGNOSIS — O09523 Supervision of elderly multigravida, third trimester: Secondary | ICD-10-CM

## 2020-10-07 LAB — POCT URINALYSIS DIP (DEVICE)
Bilirubin Urine: NEGATIVE
Glucose, UA: NEGATIVE mg/dL
Hgb urine dipstick: NEGATIVE
Ketones, ur: NEGATIVE mg/dL
Leukocytes,Ua: NEGATIVE
Nitrite: NEGATIVE
Protein, ur: NEGATIVE mg/dL
Specific Gravity, Urine: 1.02 (ref 1.005–1.030)
Urobilinogen, UA: 1 mg/dL (ref 0.0–1.0)
pH: 7 (ref 5.0–8.0)

## 2020-10-07 MED ORDER — GABAPENTIN 300 MG PO CAPS: 300.0000 mg | ORAL_CAPSULE | Freq: Three times a day (TID) | ORAL | 1 refills | Status: DC | PRN

## 2020-10-07 NOTE — Progress Notes (Signed)
Subjective:  Catherine Morris is a 40 y.o. G3P1102 at [redacted]w[redacted]d being seen today for ongoing prenatal care.  She is currently monitored for the following issues for this high-risk pregnancy and has Anxiety and depression; Chronic headache; Bilateral knee pain; Intractable chronic migraine without aura and without status migrainosus; Vitamin D deficiency; Supervision of high risk pregnancy, antepartum; Language barrier; AMA (advanced maternal age) multigravida 35+; History of migraine; Gastroesophageal reflux disease; Pre-existing type 2 diabetes mellitus during pregnancy in first trimester; and Vaginal bleeding in pregnancy on their problem list.  GDM: Patient taking metormin.  Reports no hypoglycemic episodes.  Tolerating medication well Fasting: controlled 2hr PP: controlled  Patient reports pain in left sciatic nerve after getting BMZ injection on that side. Initially, appeared like it was improving, but is now worsening. Has difficulty walking, moving, etc..   .  .   . Denies leaking of fluid.   The following portions of the patient's history were reviewed and updated as appropriate: allergies, current medications, past family history, past medical history, past social history, past surgical history and problem list. Problem list updated.  Objective:  There were no vitals filed for this visit.  Fetal Status:           General:  Alert, oriented and cooperative. Patient is in no acute distress.  Skin: Skin is warm and dry. No rash noted.   Cardiovascular: Normal heart rate noted  Respiratory: Normal respiratory effort, no problems with respiration noted  Abdomen: Soft, gravid, appropriate for gestational age.       Pelvic:       Cervical exam deferred        Extremities: Normal range of motion.     Mental Status: Normal mood and affect. Normal behavior. Normal judgment and thought content.   Urinalysis:      Assessment and Plan:  Pregnancy: G3P1102 at [redacted]w[redacted]d  1. [redacted] weeks  gestation of pregnancy  2. Supervision of high risk pregnancy, antepartum FHT and FH normal  3. Pre-existing type 2 diabetes mellitus during pregnancy in first trimester CBGs controlled.  Continue metformin. Continue ASA 81mg .  Start antenatal testing at 32 weeks Continue growth every 4 weeks.  4. Multigravida of advanced maternal age in third trimester ASA 81mg   5. Language barrier Interpreter used  6. Sciatica of left side Trial of gabapentin - discussed potential side effects. Will refer to PT.   Preterm labor symptoms and general obstetric precautions including but not limited to vaginal bleeding, contractions, leaking of fluid and fetal movement were reviewed in detail with the patient. Please refer to After Visit Summary for other counseling recommendations.  No follow-ups on file.   [place], DO

## 2020-10-08 ENCOUNTER — Telehealth: Payer: Self-pay | Admitting: General Practice

## 2020-10-08 ENCOUNTER — Other Ambulatory Visit: Payer: Self-pay | Admitting: Family Medicine

## 2020-10-08 DIAGNOSIS — M5432 Sciatica, left side: Secondary | ICD-10-CM

## 2020-10-08 LAB — CBC
Hematocrit: 33.4 % — ABNORMAL LOW (ref 34.0–46.6)
Hemoglobin: 11.8 g/dL (ref 11.1–15.9)
MCH: 31.7 pg (ref 26.6–33.0)
MCHC: 35.3 g/dL (ref 31.5–35.7)
MCV: 90 fL (ref 79–97)
Platelets: 290 10*3/uL (ref 150–450)
RBC: 3.72 x10E6/uL — ABNORMAL LOW (ref 3.77–5.28)
RDW: 12 % (ref 11.7–15.4)
WBC: 7.6 10*3/uL (ref 3.4–10.8)

## 2020-10-08 LAB — RPR: RPR Ser Ql: NONREACTIVE

## 2020-10-08 LAB — HIV ANTIBODY (ROUTINE TESTING W REFLEX): HIV Screen 4th Generation wRfx: NONREACTIVE

## 2020-10-08 MED ORDER — OXYCODONE-ACETAMINOPHEN 5-325 MG PO TABS: 2.0000 | ORAL_TABLET | Freq: Four times a day (QID) | ORAL | 0 refills | Status: DC | PRN

## 2020-10-08 NOTE — Progress Notes (Signed)
Patient called - having increased pain in left sciatic nerve. Probable nerve injury after injection of steroid for threatened preterm labor. Will give percocet and refer to neurology.

## 2020-10-08 NOTE — Telephone Encounter (Signed)
Patient called into front office requesting a call back concerning pain in her leg- was crying during phone call. Spoke with Dr Adrian Blackwater who ordered percocet for patient & Neurology referral. [facility] who states they are booked out until the end of March but can get the patient in more urgently if we change the priority to urgent. Patient will need to bring $200 to initial visit. Called patient with Eda for interpreter and discussed new medication. Patient verbalized understanding and asked if she could take gabapentin in addition to medication. Told patient yes per pharmacy but she may want to space out dosing for helpfulness of pain. Patient verbalized understanding. Discussed neurology appt with patient. Patient verbalized understanding & states she can bring money then and would like referral. Called & informed [facility]. [facility] states they will contact patient regarding referral.

## 2020-10-14 ENCOUNTER — Ambulatory Visit: Payer: Self-pay | Admitting: Neurology

## 2020-10-14 ENCOUNTER — Encounter: Payer: Self-pay | Admitting: Neurology

## 2020-10-14 VITALS — BP 104/54 | HR 96 | Ht 61.0 in | Wt 134.0 lb

## 2020-10-14 DIAGNOSIS — M5432 Sciatica, left side: Secondary | ICD-10-CM

## 2020-10-14 NOTE — Patient Instructions (Signed)
It was nice to meet you today. I am glad to hear that you are feeling better with your left leg pain.  I do believe you have had an irritation to your left sciatic nerve.  Thankfully, there are no alarming signs on your exam, and you have been in less pain.  You can continue to use your gabapentin and oxycodone as prescribed by Dr. Adrian Blackwater and try to use the least amount of medication as possible.  You can continue to walk.  Thankfully you have no significant weakness or footdrop.  If you should develop a severe exacerbation of your pain or a foot drop on the left side or difficulty with urination or urine retention, meaning inability to go when you need to, please go to the emergency room immediately.  For now, you can follow-up with your OB as scheduled.  We can see you in this clinic on an as-needed basis. I do not believe there is any reason to do an urgent MRI of your back at this time.  If you have ongoing back pain after you have had your baby, an elective lumbar spine MRI can be considered but there is no urgent or emergent need for it at this point.

## 2020-10-14 NOTE — Progress Notes (Signed)
Subjective:    Patient ID: Catherine Morris is a 40 y.o. female.  HPI     Huston Foley, MD, PhD [facility] [address], [address] [address] [place], [place] [address]  Dear Dr. Adrian Blackwater,   I saw your patient, Catherine Morris, upon your kind request, in my Neurologic clinic today for initial consultation of her left-sided back pain.  The patient is accompanied by a Spanish interpreter today.  As you know, Catherine Morris is a 40 year old right-handed woman, currently pregnant in the third trimester, with an underlying medical history of diabetes, gestational diabetes, knee pain, and bipolar disorder (by chart review), who reports new onset left-sided low back pain with radiation to the left calf and left foot since approximately 09/18/2020.  Of note, she was hospitalized earlier in January for preterm labor and vaginal bleeding.  She received a steroid injection. I reviewed your office note from 10/07/2020.  She has been given a prescription for gabapentin for symptomatic treatment of her radiating back pain as well as a prescription for Percocet.   She reports that she has been on gabapentin for about a week.  Initially, she took it 3 times a day but now she takes 1 pill once a day or twice daily.  She has been taking the Percocet which is written for 2 pills every 6 hours as needed.  She has typically taken only 1 pill at a time, up to twice daily, sometimes only at night.  She reports that the pain is better.  She did try heat application and cold, nothing really worked until she started the gabapentin and Percocet.  She tries to avoid taking medication as much as possible.  She reports that the pain is mostly in the left hip joint area and tends to radiate to the left calf and sometimes she has numbness and tingling in the lateral aspect of the left foot.  She has not had any foot drop, she has not had any saddle anesthesia or urinary incontinence or  retention.  She has not fallen.  She does not use a walking aid.  She is able to walk but tries to avoid stairs.  She feels improved but decided to keep this appointment just in case.  Her Past Medical History Is Significant For: Past Medical History:  Diagnosis Date  . Back pain   . Bipolar disorder (HCC)   . Depression   . Diabetes mellitus without complication (HCC)   . Gestational diabetes   . Knee pain     Her Past Surgical History Is Significant For: Past Surgical History:  Procedure Laterality Date  . NO PAST SURGERIES      Her Family History Is Significant For: Family History  Problem Relation Age of Onset  . Hypertension Mother   . Diabetes Father   . Diabetes Brother     Her Social History Is Significant For: Social History   Socioeconomic History  . Marital status: Single    Spouse name: Not on file  . Number of children: Not on file  . Years of education: Not on file  . Highest education level: Not on file  Occupational History  . Not on file  Tobacco Use  . Smoking status: Never Smoker  . Smokeless tobacco: Never Used  Vaping Use  . Vaping Use: Never used  Substance and Sexual Activity  . Alcohol use: No  . Drug use: No  . Sexual activity: Yes    Partners: Male  Other  Topics Concern  . Not on file  Social History Narrative  . Not on file   Social Determinants of Health   Financial Resource Strain: Not on file  Food Insecurity: No Food Insecurity  . Worried About Programme researcher, broadcasting/film/video in the Last Year: Never true  . Ran Out of Food in the Last Year: Never true  Transportation Needs: No Transportation Needs  . Lack of Transportation (Medical): No  . Lack of Transportation (Non-Medical): No  Physical Activity: Not on file  Stress: Not on file  Social Connections: Not on file    Her Allergies Are:  No Known Allergies:   Her Current Medications Are:  Outpatient Encounter Medications as of 10/14/2020  Medication Sig  . aspirin EC 81 MG  tablet Take 1 tablet (81 mg total) by mouth daily.  Marland Kitchen gabapentin (NEURONTIN) 300 MG capsule Take 1 capsule (300 mg total) by mouth 3 (three) times daily as needed for up to 14 days (pain).  . metFORMIN (GLUCOPHAGE) 1000 MG tablet Take 1 tablet (1,000 mg total) by mouth 2 (two) times daily with a meal.  . oxyCODONE-acetaminophen (PERCOCET/ROXICET) 5-325 MG tablet Take 2 tablets by mouth every 6 (six) hours as needed for severe pain.  . Prenatal Vit-DSS-Fe Cbn-FA (PRENATAL AD PO) Take by mouth.  Marland Kitchen VITAMIN D PO Take by mouth daily.  . [DISCONTINUED] pantoprazole (PROTONIX) 20 MG tablet Take 1 tablet (20 mg total) by mouth daily. (Patient not taking: Reported on 10/14/2020)  . [DISCONTINUED] promethazine (PHENERGAN) 25 MG tablet Take 1 tablet (25 mg total) by mouth every 6 (six) hours as needed for nausea or vomiting. (Patient not taking: No sig reported)   No facility-administered encounter medications on file as of 10/14/2020.  :   Review of Systems:  Out of a complete 14 point review of systems, all are reviewed and negative with the exception of these symptoms as listed below:  Review of Systems  Neurological:       Here to discuss worsening left leg pain.  Pt reports from 09/14/20 to 09/18/2020 she was admitted to the [facility] and received a steroid injection to help develop the lungs of her baby. She is currently [redacted] weeks pregnant. Since receiving this injection she has had extreme left leg pain. Reports last Thursday she was unable to move her leg. Pt was prescribed Gabapentin and Oxycodone and reports these medications have helped.       Objective:  Neurological Exam  Physical Exam Physical Examination:   Vitals:   10/14/20 1444  BP: (!) 104/54  Pulse: 96  SpO2: 97%    General Examination: The patient is a very pleasant 40 y.o. female in no acute distress. She appears well-developed and well-nourished and well groomed.   HEENT: Normocephalic, atraumatic, pupils are equal, round and  reactive to light, extraocular tracking is well-preserved, hearing is grossly intact, face is symmetric with normal facial animation and normal facial sensation to light touch, vibration, pinprick and temperature.  Speech is clear without dysarthria, hypophonia or voice tremor.  Airway examination reveals mild mouth dryness, tongue protrudes centrally and palate elevates symmetrically.  Tongue movements are full.  No carotid bruits.  Neck is supple with full range of motion.  Chest: Clear to auscultation without wheezing, rhonchi or crackles noted.  Heart: S1+S2+0, regular and normal without murmurs, rubs or gallops noted.   Abdomen: Soft, non-tender, pregnant belly, normal bowel sounds appreciated on auscultation.  Extremities: There is no pitting edema in the distal lower  extremities bilaterally. Pedal pulses are intact.  Skin: Warm and dry without trophic changes noted. There are no varicose veins.  Musculoskeletal: exam reveals no obvious joint deformities, tenderness or joint swelling or erythema.   Neurologically:  Mental status: The patient is awake, alert and oriented in all 4 spheres. Her immediate and remote memory, attention, language skills and fund of knowledge are appropriate. There is no evidence of aphasia, agnosia, apraxia or anomia. Speech is clear with normal prosody and enunciation. Thought process is linear. Mood is normal and affect is normal.  Cranial nerves II - XII are as described above under HEENT exam. In addition: shoulder shrug is normal with equal shoulder height noted. Motor exam: Normal bulk, strength and tone is noted.  She has mild limitation to left hip flexor strength because of pain in the left hip joint but strength is still about 4+ out of 5, otherwise normal strength and tone.  In particular, no foot drop.  She has no drift, postural or resting tremor in the upper extremities, no intention tremor in the upper extremities, Romberg is negative.  Reflexes are  2+ in the upper extremities and 1+ in the lower extremities, toes are downgoing bilaterally.   Fine motor skills and coordination: intact with normal finger taps, normal hand movements, normal rapid alternating patting, normal foot taps and normal foot agility.  Cerebellar testing: No dysmetria or intention tremor on finger to nose testing. Heel to shin is unremarkable bilaterally. There is no truncal or gait ataxia.  Sensory exam: intact to light touch, pinprick, vibration, temperature sense in the upper and right lower extremities and mild decrease to pinprick and temperature sense in the left lateral calf area, no complete numbness.  Gait, station and balance: She stands slowly, posture is appropriate for third trimester pregnancy, she is able to stand narrow-base.  She walks slightly slowly and has a slight limp on the left.  No foot drop.  Assessment and Plan:    In summary, Catherine Morris is a very pleasant 40 y.o.-year old female with an underlying medical history of diabetes, gestational diabetes, knee pain, and bipolar disorder (by chart review), who presents for evaluation of new onset left sided low back pain radiating to the left hip and left lateral leg including lateral foot on the left side.  Symptoms started about a month ago and have improved.  She has found symptomatic relief with gabapentin and Percocet as needed.  Examination shows no red flags, in particular, no foot drop, no saddle anesthesia, and history is not suggestive of any serious complications such as significant weakness, fall, bowel or bladder incontinence or retention.  Exam shows mild decrease in sensation in the distal and lateral left leg, mostly in the calf area.  She likely had left sciatic nerve irritation.   She has some pain limitation in her left hip flexor but overall exam otherwise is benign.  She is largely reassured today.  I do not see a pressing reason for an urgent or emergent lumbar spine MRI at  this time.  She is advised to follow-up with your clinic as scheduled.  She can continue with as needed gabapentin and/or Percocet as per your discretion.  She is advised to proceed to the emergency room should she have sudden escalation of her pain, develop a foot drop, saddle anesthesia, and/or bowel or bladder incontinence or retention.  She does report a history of chronic constipation but no new symptoms with regards to this, has not really become  worse since starting the Percocet as needed.   If she has ongoing radiating low back pain and sciatica type symptoms after she delivers her baby, and elective lumbar spine MRI can be considered.  She is advised to follow-up in this clinic on an as-needed basis.  I answered all her questions today and the patient was in agreement. Thank you very much for allowing me to participate in the care of this nice patient. If I can be of any further assistance to you please do not hesitate to call me at [phone].  Sincerely,   Huston Foley, MD, PhD

## 2020-10-19 ENCOUNTER — Telehealth: Payer: Self-pay

## 2020-10-19 NOTE — Telephone Encounter (Signed)
Created GFE's for pt's upcoming appointments and they are available to her via MyChart.

## 2020-10-22 ENCOUNTER — Encounter: Payer: Self-pay | Admitting: Obstetrics and Gynecology

## 2020-10-22 ENCOUNTER — Telehealth (INDEPENDENT_AMBULATORY_CARE_PROVIDER_SITE_OTHER): Payer: Self-pay | Admitting: Obstetrics and Gynecology

## 2020-10-22 DIAGNOSIS — Z789 Other specified health status: Secondary | ICD-10-CM

## 2020-10-22 DIAGNOSIS — O24111 Pre-existing diabetes mellitus, type 2, in pregnancy, first trimester: Secondary | ICD-10-CM

## 2020-10-22 DIAGNOSIS — Z3A30 30 weeks gestation of pregnancy: Secondary | ICD-10-CM

## 2020-10-22 DIAGNOSIS — O0993 Supervision of high risk pregnancy, unspecified, third trimester: Secondary | ICD-10-CM

## 2020-10-22 DIAGNOSIS — Z603 Acculturation difficulty: Secondary | ICD-10-CM

## 2020-10-22 DIAGNOSIS — Z7984 Long term (current) use of oral hypoglycemic drugs: Secondary | ICD-10-CM

## 2020-10-22 DIAGNOSIS — O09523 Supervision of elderly multigravida, third trimester: Secondary | ICD-10-CM

## 2020-10-22 DIAGNOSIS — Z758 Other problems related to medical facilities and other health care: Secondary | ICD-10-CM

## 2020-10-22 DIAGNOSIS — O0990 Supervision of high risk pregnancy, unspecified, unspecified trimester: Secondary | ICD-10-CM

## 2020-10-22 NOTE — Progress Notes (Signed)
OBSTETRICS PRENATAL VIRTUAL VISIT ENCOUNTER NOTE  [facility]   I connected with Catherine Morris on 10/22/20 at 10:35 AM EST by MyChart Video Encounter at home and verified that I am speaking with the correct person using two identifiers.   I discussed the limitations, risks, security and privacy concerns of performing an evaluation and management service virtually and the availability of in person appointments. I also discussed with the patient that there may be a patient responsible charge related to this service. The patient expressed understanding and agreed to proceed. Subjective:  Catherine Morris is a 40 y.o. [id number] at 709w4d being seen today for ongoing prenatal care.  She is currently monitored for the following issues for this high-risk pregnancy and has Anxiety and depression; Chronic headache; Bilateral knee pain; Intractable chronic migraine without aura and without status migrainosus; Vitamin D deficiency; Supervision of high risk pregnancy, antepartum; Language barrier; AMA (advanced maternal age) multigravida 35+; History of migraine; Gastroesophageal reflux disease; Pre-existing type 2 diabetes mellitus during pregnancy in first trimester; and Vaginal bleeding in pregnancy on their problem list.  Patient reports noted some spotting this morning, only need a pad. Last IC this past "Sunday. No cramps or LOF..  Contractions: Not present. Vag. Bleeding: Bloody Show.  Movement: Present. Denies any leaking of fluid.   The following portions of the patient's history were reviewed and updated as appropriate: allergies, current medications, past family history, past medical history, past social history, past surgical history and problem list.   Objective:  There were no vitals filed for this visit.  Fetal Status:     Movement: Present     General:  Alert, oriented and cooperative. Patient is in no acute  distress.  Respiratory: Normal respiratory effort, no problems with respiration noted  Mental Status: Normal mood and affect. Normal behavior. Normal judgment and thought content.  Rest of physical exam deferred due to type of encounter  Imaging: US MFM OB FOLLOW UP  Result Date: 09/29/2020 ----------------------------------------------------------------------  OBSTETRICS REPORT                       (Signed Final 09/29/2020 02:01 pm) ---------------------------------------------------------------------- Patient Info  ID #:       [id number]                          D.O.B.:  08/27/1981 (39 yrs)  Name:       Catherine RAMIREZ-               Visit Date: 09/29/2020 10:11 am              Morris ---------------------------------------------------------------------- Performed By  Attending:        Victor Fang MD         Ref. Address:     [address]  [place], [place]                                                             [address]  Performed By:     Eden Lathe BS      [facility]  Referred By:      Adam Phenix                    MD ---------------------------------------------------------------------- Orders  #  Description                           Code        Ordered By  1  [place] MFM OB FOLLOW UP                   [phone]    Noralee Space ----------------------------------------------------------------------  #  Order #                     Accession #                Episode #  1  [id number]                   [id number]                 [id number] ---------------------------------------------------------------------- Indications  Pre-existing diabetes,  type 2, in pregnancy,   O24.112  second trimester (Metformin)  Encounter for other antenatal screening        Z36.2  follow-up  Advanced maternal age multigravida 19+,        O32.522  second trimester  Medical complication of pregnancy (Bipolar,    O26.90  Anxiety, Depression)  Subchorionic hemorrhage, antepartum            O45.90  Poor obstetric history: Previous preterm       O09.219  delivery, antepartum  LR NIPS  [redacted] weeks gestation of pregnancy                Z3A.27 ---------------------------------------------------------------------- Fetal Evaluation  Num Of Fetuses:         1  Fetal Heart Rate(bpm):  150  Cardiac Activity:       Observed  Presentation:           Cephalic  Placenta:               Posterior  P. Cord Insertion:      Previously Visualized  Amniotic Fluid  AFI FV:      Within normal limits                              Largest Pocket(cm)                              5.7 ---------------------------------------------------------------------- Biometry  BPD:      75.3  mm     G. Age:  30w 1d         99  %    CI:        76.73   %    70 - 86                                                          FL/HC:      18.7   %    18.6 - 20.4  HC:      272.3  mm     G. Age:  29w 5d         91  %    HC/AC:      1.12        1.05 - 1.21  AC:      243.7  mm     G. Age:  28w 4d         81  %    FL/BPD:     67.5   %    71 - 87  FL:       50.8  mm     G. Age:  27w 2d         33  %    FL/AC:      20.8   %    20 - 24  HUM:      47.3  mm     G. Age:  27w 6d         58  %  LV:        5.5  mm  Est. FW:    1217  gm    2 lb 11 oz      80  % ---------------------------------------------------------------------- OB History  Blood Type:   O+  Gravidity:    3  Living:       2 ---------------------------------------------------------------------- Gestational Age  LMP:           28w 2d        Date:  03/15/20                 EDD:   12/20/20  U/S Today:     29w 0d                                        EDD:   12/15/20  Best:  27w 2d     Det. By:  Marcella Dubs         EDD:   12/27/20 ---------------------------------------------------------------------- Anatomy  Cranium:               Appears normal         LVOT:                   Appears normal  Cavum:                 Appears normal         Aortic Arch:            Appears normal  Ventricles:            Appears normal         Ductal Arch:            Previously seen  Choroid Plexus:        Appears normal         Diaphragm:              Previously seen  Cerebellum:            Appears normal         Stomach:                Appears normal, left                                                                        sided  Posterior Fossa:       Previously seen        Abdomen:                Appears normal  Nuchal Fold:           Previously seen        Abdominal Wall:         Previously seen  Face:                  Orbits and profile     Cord Vessels:           Appears normal (3                         previously seen                                vessel cord)  Lips:                  Appears normal         Kidneys:                Appear normal  Palate:                Not well visualized    Bladder:                Appears normal  Thoracic:              Previously seen        Spine:  Previously seen                         Appears normal  Heart:                 Previously seen        Upper Extremities:      Previously seen  RVOT:                  Appears normal         Lower Extremities:      Previously seen  Other:  Hands and feet visualized previously. Fetus appears to be a female.          Heels and 5th digit previously visualized. ---------------------------------------------------------------------- Cervix Uterus Adnexa  Cervix  Not visualized (advanced GA >24wks)  Uterus  No abnormality visualized.  Right Ovary  Not visualized.  Left Ovary  Not visualized.  Cul De Sac  No free fluid seen.  Adnexa  No abnormality visualized.  ---------------------------------------------------------------------- Comments  This patient was seen for a follow up growth scan due to  pregestational diabetes that is currently treated with  metformin.  She was hospitalized about 2 weeks ago due to  vaginal bleeding during which time she received a complete  course of antenatal corticosteroids.  The cause of her vaginal  bleeding remains undetermined.  She was informed that the fetal growth and amniotic fluid  level appears appropriate for her gestational age.  There were no signs of placenta previa noted on today's  exam.  Due to pregestational diabetes, a follow-up growth scan was  scheduled in 4 weeks.  We will also start weekly fetal testing  at 32 weeks (in 5 weeks). ----------------------------------------------------------------------                   Ma Rings, MD Electronically Signed Final Report   09/29/2020 02:01 pm ----------------------------------------------------------------------   Assessment and Plan:  Pregnancy: [id number] at [redacted]w[redacted]d 1. Supervision of high risk pregnancy, antepartum Stable Bleeding precautions reviewed with pt and indications to present to MAU  2. Pre-existing type 2 diabetes mellitus during pregnancy in first trimester Reports CBG's in goal range Continue with Metformin Antenatal testing and serial growth scans scheduled, starting at 32 weeks  3. Multigravida of advanced maternal age in third trimester Stable  4. Language barrier Video interrupter used during today's visit  Preterm labor symptoms and general obstetric precautions including but not limited to vaginal bleeding, contractions, leaking of fluid and fetal movement were reviewed in detail with the patient. I discussed the assessment and treatment plan with the patient. The patient was provided an opportunity to ask questions and all were answered. The patient agreed with the plan and demonstrated an understanding of the instructions. The patient  was advised to call back or seek an in-person office evaluation/go to [facility]U at [facility] for any urgent or concerning symptoms. Please refer to After Visit Summary for other counseling recommendations.   I provided 8 minutes of face-to-face time during this encounter.  Return in about 2 weeks (around 11/05/2020) for face to face, MD only.  Future Appointments  Date Time Provider Department Center  10/27/2020 10:30 AM [facility] NURSE [facility]  10/27/2020 10:45 AM [facility] US4 [facility]  11/03/2020 10:30 AM [facility] NURSE [facility]  11/03/2020 10:45 AM [facility] US4 [facility]  11/04/2020  1:15 PM Venora Maples, MD [facility]  11/18/2020  1:15 PM Venora Maples,  MD [facility]  12/02/2020  1:15 PM Venora Maples, MD [facility]    Hermina Staggers, MD Center for [facility], [facility]

## 2020-10-22 NOTE — Progress Notes (Signed)
I connected with  Catherine Morris on 10/22/20 at 10:35 AM EST by telephone and verified that I am speaking with the correct person using two identifiers.   I discussed the limitations, risks, security and privacy concerns of performing an evaluation and management service by telephone and the availability of in person appointments. I also discussed with the patient that there may be a patient responsible charge related to this service. The patient expressed understanding and agreed to proceed.  Henrietta Dine, CMA 10/22/2020  10:49 AM

## 2020-10-22 NOTE — Progress Notes (Signed)
Pt currently at work, will take BP when she gets home & record in [place].

## 2020-10-27 ENCOUNTER — Other Ambulatory Visit: Payer: Self-pay

## 2020-10-27 ENCOUNTER — Other Ambulatory Visit: Payer: Self-pay | Admitting: *Deleted

## 2020-10-27 ENCOUNTER — Ambulatory Visit: Payer: Self-pay | Attending: Obstetrics and Gynecology

## 2020-10-27 ENCOUNTER — Ambulatory Visit: Payer: Self-pay | Admitting: *Deleted

## 2020-10-27 ENCOUNTER — Encounter: Payer: Self-pay | Admitting: *Deleted

## 2020-10-27 DIAGNOSIS — O09213 Supervision of pregnancy with history of pre-term labor, third trimester: Secondary | ICD-10-CM

## 2020-10-27 DIAGNOSIS — O09523 Supervision of elderly multigravida, third trimester: Secondary | ICD-10-CM

## 2020-10-27 DIAGNOSIS — O24113 Pre-existing diabetes mellitus, type 2, in pregnancy, third trimester: Secondary | ICD-10-CM

## 2020-10-27 DIAGNOSIS — F319 Bipolar disorder, unspecified: Secondary | ICD-10-CM

## 2020-10-27 DIAGNOSIS — Z3A31 31 weeks gestation of pregnancy: Secondary | ICD-10-CM

## 2020-10-27 DIAGNOSIS — O0990 Supervision of high risk pregnancy, unspecified, unspecified trimester: Secondary | ICD-10-CM | POA: Insufficient documentation

## 2020-10-27 DIAGNOSIS — F418 Other specified anxiety disorders: Secondary | ICD-10-CM

## 2020-10-27 DIAGNOSIS — Z7984 Long term (current) use of oral hypoglycemic drugs: Secondary | ICD-10-CM

## 2020-10-27 DIAGNOSIS — O4593 Premature separation of placenta, unspecified, third trimester: Secondary | ICD-10-CM

## 2020-10-27 DIAGNOSIS — O99343 Other mental disorders complicating pregnancy, third trimester: Secondary | ICD-10-CM

## 2020-10-27 DIAGNOSIS — O24419 Gestational diabetes mellitus in pregnancy, unspecified control: Secondary | ICD-10-CM | POA: Insufficient documentation

## 2020-11-03 ENCOUNTER — Encounter: Payer: Self-pay | Admitting: *Deleted

## 2020-11-03 ENCOUNTER — Ambulatory Visit: Payer: Self-pay | Admitting: *Deleted

## 2020-11-03 ENCOUNTER — Other Ambulatory Visit: Payer: Self-pay

## 2020-11-03 ENCOUNTER — Ambulatory Visit: Payer: Self-pay | Attending: Obstetrics and Gynecology

## 2020-11-03 DIAGNOSIS — O4593 Premature separation of placenta, unspecified, third trimester: Secondary | ICD-10-CM

## 2020-11-03 DIAGNOSIS — F319 Bipolar disorder, unspecified: Secondary | ICD-10-CM

## 2020-11-03 DIAGNOSIS — O0990 Supervision of high risk pregnancy, unspecified, unspecified trimester: Secondary | ICD-10-CM | POA: Insufficient documentation

## 2020-11-03 DIAGNOSIS — O09213 Supervision of pregnancy with history of pre-term labor, third trimester: Secondary | ICD-10-CM

## 2020-11-03 DIAGNOSIS — O24113 Pre-existing diabetes mellitus, type 2, in pregnancy, third trimester: Secondary | ICD-10-CM

## 2020-11-03 DIAGNOSIS — Z362 Encounter for other antenatal screening follow-up: Secondary | ICD-10-CM

## 2020-11-03 DIAGNOSIS — Z3A32 32 weeks gestation of pregnancy: Secondary | ICD-10-CM

## 2020-11-03 DIAGNOSIS — O99343 Other mental disorders complicating pregnancy, third trimester: Secondary | ICD-10-CM

## 2020-11-03 DIAGNOSIS — F419 Anxiety disorder, unspecified: Secondary | ICD-10-CM

## 2020-11-03 DIAGNOSIS — O24419 Gestational diabetes mellitus in pregnancy, unspecified control: Secondary | ICD-10-CM

## 2020-11-03 DIAGNOSIS — O09523 Supervision of elderly multigravida, third trimester: Secondary | ICD-10-CM

## 2020-11-04 ENCOUNTER — Encounter: Payer: Self-pay | Admitting: Family Medicine

## 2020-11-04 ENCOUNTER — Telehealth (INDEPENDENT_AMBULATORY_CARE_PROVIDER_SITE_OTHER): Payer: Self-pay | Admitting: Family Medicine

## 2020-11-04 VITALS — BP 106/68 | HR 87

## 2020-11-04 DIAGNOSIS — O24111 Pre-existing diabetes mellitus, type 2, in pregnancy, first trimester: Secondary | ICD-10-CM

## 2020-11-04 DIAGNOSIS — Z3A32 32 weeks gestation of pregnancy: Secondary | ICD-10-CM

## 2020-11-04 DIAGNOSIS — O0990 Supervision of high risk pregnancy, unspecified, unspecified trimester: Secondary | ICD-10-CM

## 2020-11-04 DIAGNOSIS — Z8669 Personal history of other diseases of the nervous system and sense organs: Secondary | ICD-10-CM

## 2020-11-04 DIAGNOSIS — E119 Type 2 diabetes mellitus without complications: Secondary | ICD-10-CM

## 2020-11-04 DIAGNOSIS — Z789 Other specified health status: Secondary | ICD-10-CM

## 2020-11-04 DIAGNOSIS — O09523 Supervision of elderly multigravida, third trimester: Secondary | ICD-10-CM

## 2020-11-04 DIAGNOSIS — O24113 Pre-existing diabetes mellitus, type 2, in pregnancy, third trimester: Secondary | ICD-10-CM

## 2020-11-04 NOTE — Patient Instructions (Signed)
 Eleccin del mtodo anticonceptivo Contraception Choices La anticoncepcin, o los mtodos anticonceptivos, hace referencia a los mtodos o dispositivos que evitan el embarazo. Mtodos hormonales Implante anticonceptivo Un implante anticonceptivo consiste en un tubo delgado de plstico que contiene una hormona que evita el embarazo. Es diferente de un dispositivo intrauterino (DIU). Un mdico lo inserta en la parte superior del brazo. Los implantes pueden ser eficaces durante un mximo de 3 aos. Inyecciones de progestina sola Las inyecciones de progestina sola contienen progestina, una forma sinttica de la hormona progesterona. Un mdico las administra cada 3 meses. Pldoras anticonceptivas Las pldoras anticonceptivas son pastillas que contienen hormonas que evitan el embarazo. Deben tomarse una vez al da, preferentemente a la misma hora cada da. Se necesita una receta para utilizar este mtodo anticonceptivo. Parche anticonceptivo El parche anticonceptivo contiene hormonas que evitan el embarazo. Se coloca en la piel, debe cambiarse una vez a la semana durante tres semanas y debe retirarse en la cuarta semana. Se necesita una receta para utilizar este mtodo anticonceptivo. Anillo vaginal Un anillo vaginal contiene hormonas que evitan el embarazo. Se coloca en la vagina durante tres semanas y se retira en la cuarta semana. Luego se repite el proceso con un anillo nuevo. Se necesita una receta para utilizar este mtodo anticonceptivo. Anticonceptivo de emergencia Los anticonceptivos de emergencia son mtodos para evitar un embarazo despus de tener sexo sin proteccin. Vienen en forma de pldora y pueden tomarse hasta 5 das despus de tener sexo. Funcionan mejor cuando se toman lo ms pronto posible luego de tener sexo. La mayora de los anticonceptivos de emergencia estn disponibles sin receta mdica. Este mtodo no debe utilizarse como el nico mtodo anticonceptivo.   Mtodos de  barrera Condn masculino Un condn masculino es una vaina delgada que se coloca sobre el pene durante el sexo. Los condones evitan que el esperma ingrese en el cuerpo de la mujer. Pueden utilizarse con un una sustancia que mata a los espermatozoides (espermicida) para aumentar la efectividad. Deben desecharse despus de un uso. Condn femenino Un condn femenino es una vaina blanda y holgada que se coloca en la vagina antes de tener sexo. El condn evita que el esperma ingrese en el cuerpo de la mujer. Deben desecharse despus de un uso. Diafragma Un diafragma es una barrera blanda con forma de cpula. Se inserta en la vagina antes del sexo, junto con un espermicida. El diafragma bloquea el ingreso de esperma en el tero, y el espermicida mata a los espermatozoides. El diafragma debe permanecer en la vagina durante 6 a 8 horas despus de tener sexo y debe retirarse en el plazo de las 24 horas. Un diafragma es recetado y colocado por un mdico. Debe reemplazarse cada 1 a 2 aos, despus de dar a luz, de aumentar ms de 15lb (6.8kg) y de una ciruga plvica. Capuchn cervical Un capuchn cervical es una copa redonda y blanda de ltex o plstico que se coloca en el cuello uterino. Se inserta en la vagina antes del sexo, junto con un espermicida. Bloquea el ingreso del esperma en el tero. El capuchn debe permanecer en el lugar durante 6 a 8 horas despus de tener sexo y debe retirarse en el plazo de las 48 horas. Un capuchn cervical debe ser recetado y colocado por un mdico. Debe reemplazarse cada 2aos. Esponja Una esponja es una pieza blanda y circular de espuma de poliuretano que contiene espermicida. La esponja ayuda a bloquear el ingreso de esperma en el tero, y el   espermicida mata a los espermatozoides. Para utilizarla, debe humedecerla e insertarla en la vagina. Debe insertarse antes de tener sexo, debe permanecer dentro al menos durante 6 horas despus de tener sexo y debe retirarse y  desecharse en el plazo de las 30 horas. Espermicidas Los espermicidas son sustancias qumicas que matan o bloquean al esperma y no lo dejan ingresar al cuello uterino y al tero. Vienen en forma de crema, gel, supositorio, espuma o comprimido. Un espermicida debe insertarse en la vagina con un aplicador al menos 10 o 15 minutos antes de tener sexo para dar tiempo a que surta efecto. El proceso debe repetirse cada vez que tenga sexo. Los espermicidas no requieren receta mdica.   Anticonceptivos intrauterinos Dispositivo intrauterino (DIU) Un DIU es un dispositivo en forma de T que se coloca en el tero. Existen dos tipos:  DIU hormonal.Este tipo contiene progestina, una forma sinttica de la hormona progesterona. Este tipo puede permanecer colocado durante 3 a 5 aos.  DIU de cobre.Este tipo est recubierto con un alambre de cobre. Puede permanecer colocado durante 10 aos. Mtodos anticonceptivos permanentes Ligadura de trompas en la mujer En este mtodo, se sellan, atan u obstruyen las trompas de Falopio durante una ciruga para evitar que el vulo descienda hacia el tero. Esterilizacin histeroscpica En este mtodo, se coloca un implante pequeo y flexible dentro de cada trompa de Falopio. Los implantes hacen que se forme un tejido cicatricial en las trompas de Falopio y que las obstruya para que el espermatozoide no pueda llegar al vulo. El procedimiento demora alrededor de 3 meses para que sea efectivo. Debe utilizarse otro mtodo anticonceptivo durante esos 3 meses. Esterilizacin masculina Este es un procedimiento que consiste en atar los conductos que transportan el esperma (vasectoma). Luego del procedimiento, el hombre puede eyacular lquido (semen). Debe utilizarse otro mtodo anticonceptivo durante 3 meses despus del procedimiento. Mtodos de planificacin natural Planificacin familiar natural En este mtodo, la pareja no tiene sexo durante los das en que la mujer podra quedar  embarazada. Mtodo calendario En este mtodo, la mujer realiza un seguimiento de la duracin de cada ciclo menstrual, identifica los das en los que se puede producir un embarazo y no tiene sexo durante esos das. Mtodo de la ovulacin En este mtodo, la pareja evita tener sexo durante la ovulacin. Mtodo sintotrmico Este mtodo implica no tener sexo durante la ovulacin. Normalmente, la mujer comprueba la ovulacin al observar cambios en su temperatura y en la consistencia del moco cervical. Mtodo posovulacin En este mtodo, la pareja espera a que finalice la ovulacin para tener sexo. Dnde buscar ms informacin  Centers for Disease Control and Prevention (Centros para el Control y la Prevencin de Enfermedades): www.cdc.gov Resumen  La anticoncepcin, o los mtodos anticonceptivos, hace referencia a los mtodos o dispositivos que evitan el embarazo.  Los mtodos anticonceptivos hormonales incluyen implantes, inyecciones, pastillas, parches, anillos vaginales y anticonceptivos de emergencia.  Los mtodos anticonceptivos de barrera pueden incluir condones masculinos, condones femeninos, diafragmas, capuchones cervicales, esponjas y espermicidas.  Existen dos tipos de DIU (dispositivo intrauterino). Un DIU puede colocarse en el tero de una mujer para evitar el embarazo durante 3 a 5 aos.  La esterilizacin permanente puede realizarse mediante un procedimiento tanto en los hombres como en las mujeres. Los mtodos de planificacin familiar natural implican no tener sexo durante los das en que la mujer podra quedar embarazada. Esta informacin no tiene como fin reemplazar el consejo del mdico. Asegrese de hacerle al mdico cualquier pregunta que   tenga. Document Revised: 03/31/2020 Document Reviewed: 03/31/2020 Elsevier Patient Education  2021 Else[employer] Inc.   Lactancia materna Breastfeeding  Decidir amamantar es una de las mejores elecciones que puede hacer por usted y su  beb. Un cambio en las hormonas durante el embarazo hace que las mamas produzcan leche materna en las glndulas productoras de leche. Las hormonas impiden que la leche materna sea liberada antes del nacimiento del beb. Adems, impulsan el flujo de leche luego del nacimiento. Una vez que ha comenzado a amamantar, pensar en el beb, as como la succin o el llanto, pueden estimular la liberacin de leche de las glndulas productoras de leche. Los beneficios de amamantar Las investigaciones demuestran que la lactancia materna ofrece muchos beneficios de salud para bebs y madres. Adems, ofrece una forma gratuita y conveniente de alimentar al beb. Para el beb  La primera leche (calostro) ayuda a mejorar el funcionamiento del aparato digestivo del beb.  Las clulas especiales de la leche (anticuerpos) ayudan a combatir las infecciones en el beb.  Los bebs que se alimentan con leche materna tambin tienen menos probabilidades de tener asma, alergias, obesidad o diabetes de tipo 2. Adems, tienen menor riesgo de sufrir el sndrome de muerte sbita del lactante (SMSL).  Los nutrientes de la leche materna son mejores para satisfacer las necesidades del beb en comparacin con la leche maternizada.  La leche materna mejora el desarrollo cerebral del beb. Para usted  La lactancia materna favorece el desarrollo de un vnculo muy especial entre la madre y el beb.  Es conveniente. La leche materna es econmica y siempre est disponible a la temperatura correcta.  La lactancia materna ayuda a quemar caloras. Le ayuda a perder el peso ganado durante el embarazo.  Hace que el tero vuelva al tamao que tena antes del embarazo ms rpido. Adems, disminuye el sangrado (loquios) despus del parto.  La lactancia materna contribuye a reducir el riesgo de tener diabetes de tipo 2, osteoporosis, artritis reumatoide, enfermedades cardiovasculares y cncer de mama, ovario, tero y endometrio en el  futuro. Informacin bsica sobre la lactancia Comienzo de la lactancia  Encuentre un lugar cmodo para sentarse o acostarse, con un buen respaldo para el cuello y la espalda.  Coloque una almohada o una manta enrollada debajo del beb para acomodarlo a la altura de la mama (si est sentada). Las almohadas para amamantar se han diseado especialmente a fin de servir de apoyo para los brazos y el beb mientras amamanta.  Asegrese de que la barriga del beb (abdomen) est frente a la suya.  Masajee suavemente la mama. Con las yemas de los dedos, masajee los bordes exteriores de la mama hacia adentro, en direccin al pezn. Esto estimula el flujo de leche. Si la leche fluye lentamente, es posible que deba continuar con este movimiento durante la lactancia.  Sostenga la mama con 4 dedos por debajo y el pulgar por arriba del pezn (forme la letra "C" con la mano). Asegrese de que los dedos se encuentren lejos del pezn y de la boca del beb.  Empuje suavemente los labios del beb con el pezn o con el dedo.  Cuando la boca del beb se abra lo suficiente, acrquelo rpidamente a la mama e introduzca todo el pezn y la arola, tanto como sea posible, dentro de la boca del beb. La arola es la zona de color que rodea al pezn. ? Debe haber ms arola visible por arriba del labio superior del beb que por debajo del labio   inferior. ? Los labios del beb deben estar abiertos y extendidos hacia afuera (evertidos) para asegurar que el beb se prenda de forma adecuada y cmoda. ? La lengua del beb debe estar entre la enca inferior y la mama.  Asegrese de que la boca del beb est en la posicin correcta alrededor del pezn (prendido). Los labios del beb deben crear un sello sobre la mama y estar doblados hacia afuera (invertidos).  Es comn que el beb succione durante 2 a 3 minutos para que comience el flujo de leche materna. Cmo debe prenderse Es muy importante que le ensee al beb cmo  prenderse adecuadamente a la mama. Si el beb no se prende adecuadamente, puede causar dolor en los pezones, reducir la produccin de leche materna y hacer que el beb tenga un escaso aumento de peso. Adems, si el beb no se prende adecuadamente al pezn, puede tragar aire durante la alimentacin. Esto puede causarle molestias al beb. Hacer eructar al beb al cambiar de mama puede ayudarlo a liberar el aire. Sin embargo, ensearle al beb cmo prenderse a la mama adecuadamente es la mejor manera de evitar que se sienta molesto por tragar aire mientras se alimenta. Signos de que el beb se ha prendido adecuadamente al pezn  Tironea o succiona de modo silencioso, sin causarle dolor. Los labios del beb deben estar extendidos hacia afuera (evertidos).  Se escucha que traga cada 3 o 4 succiones una vez que la leche ha comenzado a fluir (despus de que se produzca el reflejo de eyeccin de la leche).  Hay movimientos musculares por arriba y por delante de sus odos al succionar. Signos de que el beb no se ha prendido adecuadamente al pezn  Hace ruidos de succin o de chasquido mientras se alimenta.  Siente dolor en los pezones. Si cree que el beb no se prendi correctamente, deslice el dedo en la comisura de la boca y colquelo entre las encas del beb para interrumpir la succin. Intente volver a comenzar a amamantar. Signos de lactancia materna exitosa Signos del beb  El beb disminuir gradualmente el nmero de succiones o dejar de succionar por completo.  El beb se quedar dormido.  El cuerpo del beb se relajar.  El beb retendr una pequea cantidad de leche en la boca.  El beb se desprender solo del pecho. Signos que presenta usted  Las mamas han aumentado la firmeza, el peso y el tamao 1 a 3 horas despus de amamantar.  Estn ms blandas inmediatamente despus de amamantar.  Se producen un aumento del volumen de leche y un cambio en su consistencia y color hacia el  quinto da de lactancia.  Los pezones no duelen, no estn agrietados ni sangran. Signos de que su beb recibe la cantidad de leche suficiente  Mojar por lo menos 1 o 2paales durante las primeras 24horas despus del nacimiento.  Mojar por lo menos 5 o 6paales cada 24horas durante la primera semana despus del nacimiento. La orina debe ser clara o de color amarillo plido a los 5das de vida.  Mojar entre 6 y 8paales cada 24horas a medida que el beb sigue creciendo y desarrollndose.  Defeca por lo menos 3 veces en 24 horas a los 5 das de vida. Las heces deben ser blandas y amarillentas.  Defeca por lo menos 3 veces en 24 horas a los 7 das de vida. Las heces deben ser grumosas y amarillentas.  No registra una prdida de peso mayor al 10% del peso al   nacer durante los primeros 3 das de vida.  Aumenta de peso un promedio de 4 a 7onzas (113 a 198g) por semana despus de los 4 das de vida.  Aumenta de peso, diariamente, de manera uniforme a partir de los 5 das de vida, sin registrar prdida de peso despus de las 2semanas de vida. Despus de alimentarse, es posible que el beb regurgite una pequea cantidad de leche. Esto es normal. Frecuencia y duracin de la lactancia El amamantamiento frecuente la ayudar a producir ms leche y puede prevenir dolores en los pezones y las mamas extremadamente llenas (congestin mamaria). Alimente al beb cuando muestre signos de hambre o si siente la necesidad de reducir la congestin de las mamas. Esto se denomina "lactancia a demanda". Las seales de que el beb tiene hambre incluyen las siguientes:  Aumento del estado de alerta, actividad o inquietud.  Mueve la cabeza de un lado a otro.  Abre la boca cuando se le toca la mejilla o la comisura de la boca (reflejo de bsqueda).  Aumenta las vocalizaciones, tales como sonidos de succin, se relame los labios, emite arrullos, suspiros o chirridos.  Mueve la mano hacia la boca y se chupa  los dedos o las manos.  Est molesto o llora. Evite el uso del chupete en las primeras 4 a 6 semanas despus del nacimiento del beb. Despus de este perodo, podr usar un chupete. Las investigaciones demostraron que el uso del chupete durante el primer ao de vida del beb disminuye el riesgo de tener el sndrome de muerte sbita del lactante (SMSL). Permita que el nio se alimente en cada mama todo lo que desee. Cuando el beb se desprende o se queda dormido mientras se est alimentando de la primera mama, ofrzcale la segunda. Debido a que, con frecuencia, los recin nacidos estn somnolientos las primeras semanas de vida, es posible que deba despertar al beb para alimentarlo. Los horarios de lactancia varan de un beb a otro. Sin embargo, las siguientes reglas pueden servir como gua para ayudarla a garantizar que el beb se alimenta adecuadamente:  Se puede amamantar a los recin nacidos (bebs de 4 semanas o menos de vida) cada 1 a 3 horas.  No deben transcurrir ms de 3 horas durante el da o 5 horas durante la noche sin que se amamante a los recin nacidos.  Debe amamantar al beb un mnimo de 8 veces en un perodo de 24 horas. Extraccin de leche materna La extraccin y el almacenamiento de la leche materna le permiten asegurarse de que el beb se alimente exclusivamente de su leche materna, aun en momentos en los que no puede amamantar. Esto tiene especial importancia si debe regresar al trabajo en el perodo en que an est amamantando o si no puede estar presente en los momentos en que el beb debe alimentarse. Su asesor en lactancia puede ayudarla a encontrar un mtodo de extraccin que funcione mejor para usted y orientarla sobre cunto tiempo es seguro almacenar leche materna.      Cmo cuidar las mamas durante la lactancia Los pezones pueden secarse, agrietarse y doler durante la lactancia. Las siguientes recomendaciones pueden ayudarla a mantener las mamas humectadas y  sanas:  Evite usar jabn en los pezones.  Use un sostn de soporte diseado especialmente para la lactancia materna. Evite usar sostenes con aro o sostenes muy ajustados (sostenes deportivos).  Seque al aire sus pezones durante 3 a 4minutos despus de amamantar al beb.  Utilice solo apsitos de algodn en   el sostn para absorber las prdidas de leche. La prdida de un poco de leche materna entre las tomas es normal.  Utilice lanolina sobre los pezones luego de amamantar. La lanolina ayuda a mantener la humedad normal de la piel. La lanolina pura no es perjudicial (no es txica) para el beb. Adems, puede extraer manualmente algunas gotas de leche materna y masajear suavemente esa leche sobre los pezones para que la leche se seque al aire. Durante las primeras semanas despus del nacimiento, algunas mujeres experimentan congestin mamaria. La congestin mamaria puede hacer que sienta las mamas pesadas, calientes y sensibles al tacto. El pico de la congestin mamaria ocurre en el plazo de los 3 a 5 das despus del parto. Las siguientes recomendaciones pueden ayudarla a aliviar la congestin mamaria:  Vace por completo las mamas al amamantar o extraer leche. Puede aplicar calor hmedo en las mamas (en la ducha o con toallas hmedas para manos) antes de amamantar o extraer leche. Esto aumenta la circulacin y ayuda a que la leche fluya. Si el beb no vaca por completo las mamas cuando lo amamanta, extraiga la leche restante despus de que haya finalizado.  Aplique compresas de hielo sobre las mamas inmediatamente despus de amamantar o extraer leche, a menos que le resulte demasiado incmodo. Haga lo siguiente: ? Ponga el hielo en una bolsa plstica. ? Coloque una toalla entre la piel y la bolsa de hielo. ? Coloque el hielo durante 20minutos, 2 o 3veces por da.  Asegrese de que el beb est prendido y se encuentre en la posicin correcta mientras lo alimenta. Si la congestin mamaria  persiste luego de 48 horas o despus de seguir estas recomendaciones, comunquese con su mdico o un asesor en lactancia. Recomendaciones de salud general durante la lactancia  Consuma 3 comidas y 3 colaciones saludables todos los das. Las madres bien alimentadas que amamantan necesitan entre 450 y 500 caloras adicionales por da. Puede cumplir con este requisito al aumentar la cantidad de una dieta equilibrada que realice.  Beba suficiente agua para mantener la orina clara o de color amarillo plido.  Descanse con frecuencia, reljese y siga tomando sus vitaminas prenatales para prevenir la fatiga, el estrs y los niveles bajos de vitaminas y minerales en el cuerpo (deficiencias de nutrientes).  No consuma ningn producto que contenga nicotina o tabaco, como cigarrillos y cigarrillos electrnicos. El beb puede verse afectado por las sustancias qumicas de los cigarrillos que pasan a la leche materna y por la exposicin al humo ambiental del tabaco. Si necesita ayuda para dejar de fumar, consulte al mdico.  Evite el consumo de alcohol.  No consuma drogas ilegales o marihuana.  Antes de usar cualquier medicamento, hable con el mdico. Estos incluyen medicamentos recetados y de venta libre, como tambin vitaminas y suplementos a base de hierbas. Algunos medicamentos, que pueden ser perjudiciales para el beb, pueden pasar a travs de la leche materna.  Puede quedar embarazada durante la lactancia. Si se desea un mtodo anticonceptivo, consulte al mdico sobre cules son las opciones seguras durante la lactancia. Dnde encontrar ms informacin: Liga internacional [place]eche: www.llli.org. Comunquese con un mdico si:  Siente que quiere dejar de amamantar o se siente frustrada con la lactancia.  Sus pezones estn agrietados o sangran.  Sus mamas estn irritadas, sensibles o calientes.  Tiene los siguientes sntomas: ? Dolor en las mamas o en los pezones. ? Un rea hinchada en cualquiera  de las mamas. ? Fiebre o escalofros. ? Nuseas o vmitos. ?   Drenaje de otro lquido distinto de la leche materna desde los pezones.  Sus mamas no se llenan antes de amamantar al beb para el quinto da despus del parto.  Se siente triste y deprimida.  El beb: ? Est demasiado somnoliento como para comer bien. ? Tiene problemas para dormir. ? Tiene ms de 1 semana de vida y moja menos de 6 paales en un periodo de 24 horas. ? No ha aumentado de peso a los 5 das de vida.  El beb defeca menos de 3 veces en 24 horas.  La piel del beb o las partes blancas de los ojos se vuelven amarillentas. Solicite ayuda de inmediato si:  El beb est muy cansado (letargo) y no se quiere despertar para comer.  Le sube la fiebre sin causa. Resumen  La lactancia materna ofrece muchos beneficios de salud para bebs y madres.  Intente amamantar a su beb cuando muestre signos tempranos de hambre.  Haga cosquillas o empuje suavemente los labios del beb con el dedo o el pezn para lograr que el beb abra la boca. Acerque el beb a la mama. Asegrese de que la mayor parte de la arola se encuentre dentro de la boca del beb. Ofrzcale una mama y haga eructar al beb antes de pasar a la otra.  Hable con su mdico o asesor en lactancia si tiene dudas o problemas con la lactancia. Esta informacin no tiene como fin reemplazar el consejo del mdico. Asegrese de hacerle al mdico cualquier pregunta que tenga. Document Revised: 11/23/2017 Document Reviewed: 12/19/2016 Elsevier Patient Education  2021 Elsevier Inc.  

## 2020-11-04 NOTE — Progress Notes (Signed)
I connected with  Lorien Ramirez-Hernandez on 11/04/20 at  1:15 PM EST by telephone and verified that I am speaking with the correct person using two identifiers.   I discussed the limitations, risks, security and privacy concerns of performing an evaluation and management service by telephone and the availability of in person appointments. I also discussed with the patient that there may be a patient responsible charge related to this service. The patient expressed understanding and agreed to proceed.  Spanish Interpreter Sanjuana Letters, RN 11/04/2020  1:22 PM

## 2020-11-04 NOTE — Progress Notes (Signed)
I connected with Catherine Morris 11/04/20 at  1:15 PM EST by: MyChart video and verified that I am speaking with the correct person using two identifiers.  Patient is located at home and provider is located at [facility].     The purpose of this virtual visit is to provide medical care while limiting exposure to the novel coronavirus. I discussed the limitations, risks, security and privacy concerns of performing an evaluation and management service by MyChart video and the availability of in person appointments. I also discussed with the patient that there may be a patient responsible charge related to this service. By engaging in this virtual visit, you consent to the provision of healthcare.  Additionally, you authorize for your insurance to be billed for the services provided during this visit.  The patient expressed understanding and agreed to proceed.  The following staff members participated in the virtual visit:  Venora Maples, MD/MPH    PRENATAL VISIT NOTE  Subjective:  Catherine Morris is a 40 y.o. [id number] at [redacted]w[redacted]d  for phone visit for ongoing prenatal care.  She is currently monitored for the following issues for this high-risk pregnancy and has Anxiety and depression; Chronic headache; Bilateral knee pain; Intractable chronic migraine without aura and without status migrainosus; Vitamin D deficiency; Supervision of high risk pregnancy, antepartum; Language barrier; AMA (advanced maternal age) multigravida 35+; History of migraine; Gastroesophageal reflux disease; Pre-existing type 2 diabetes mellitus during pregnancy in first trimester; and Vaginal bleeding in pregnancy on their problem list.  Patient reports no complaints.  Contractions: Not present. Vag. Bleeding: None.  Movement: Present. Denies leaking of fluid.   The following portions of the patient's history were reviewed and updated as appropriate: allergies, current medications, past family  history, past medical history, past social history, past surgical history and problem list.   Objective:   Vitals:   11/04/20 1323  BP: 106/68  Pulse: 87   Self-Obtained  Fetal Status:     Movement: Present     Assessment and Plan:  Pregnancy: G3P1102 at [redacted]w[redacted]d 1. Supervision of high risk pregnancy, antepartum BP normal, good fetal movement Would like FMLA date changed, instructed to bring forms to front desk Would like circ after delivery  2. Language barrier Spanish  3. Pre-existing type 2 diabetes mellitus during pregnancy in first trimester Reports sugars are well controlled fastings are all 93 or below Only two PP are above goal, 127 and 132 Good control on metformin 1g BID Following w MFM, last growth 10/27/20 EFW was 63%, started weekly antenatal testing this week  4. Multigravida of advanced maternal age in third trimester   5. History of migraine   Preterm labor symptoms and general obstetric precautions including but not limited to vaginal bleeding, contractions, leaking of fluid and fetal movement were reviewed in detail with the patient.  Return in 2 weeks (on 11/18/2020) for [facility], ob visit.  Future Appointments  Date Time Provider Department Center  11/10/2020  9:30 AM [facility] NURSE [facility]  11/10/2020  9:45 AM [facility] US4 [facility]  11/17/2020 10:00 AM [facility]URSE [facility]  11/17/2020 10:15 AM [facility] US2 [facility]  11/18/2020  1:15 PM Venora Maples, MD [facility]  11/24/2020  9:30 AM [facility] NURSE [facility]  11/24/2020  9:45 AM [facility] US5 [facility]  12/02/2020  1:15 PM Venora Maples, MD [facility]     Time spent on virtual visit: 10 minutes  Venora Maples, MD

## 2020-11-09 ENCOUNTER — Telehealth: Payer: Self-pay

## 2020-11-09 NOTE — Telephone Encounter (Signed)
Created GFE's for upcoming appointments and sent them via MyChart.

## 2020-11-10 ENCOUNTER — Other Ambulatory Visit: Payer: Self-pay

## 2020-11-10 ENCOUNTER — Ambulatory Visit: Payer: Self-pay | Admitting: *Deleted

## 2020-11-10 ENCOUNTER — Encounter: Payer: Self-pay | Admitting: *Deleted

## 2020-11-10 ENCOUNTER — Ambulatory Visit: Payer: Self-pay | Attending: Obstetrics

## 2020-11-10 DIAGNOSIS — O0990 Supervision of high risk pregnancy, unspecified, unspecified trimester: Secondary | ICD-10-CM

## 2020-11-10 DIAGNOSIS — O09523 Supervision of elderly multigravida, third trimester: Secondary | ICD-10-CM

## 2020-11-17 ENCOUNTER — Encounter: Payer: Self-pay | Admitting: *Deleted

## 2020-11-17 ENCOUNTER — Other Ambulatory Visit: Payer: Self-pay

## 2020-11-17 ENCOUNTER — Ambulatory Visit: Payer: Self-pay | Attending: Obstetrics

## 2020-11-17 ENCOUNTER — Ambulatory Visit: Payer: Self-pay | Admitting: *Deleted

## 2020-11-17 DIAGNOSIS — O0990 Supervision of high risk pregnancy, unspecified, unspecified trimester: Secondary | ICD-10-CM | POA: Insufficient documentation

## 2020-11-17 DIAGNOSIS — O09523 Supervision of elderly multigravida, third trimester: Secondary | ICD-10-CM | POA: Insufficient documentation

## 2020-11-18 ENCOUNTER — Encounter: Payer: Self-pay | Admitting: Family Medicine

## 2020-11-18 ENCOUNTER — Other Ambulatory Visit: Payer: Self-pay | Admitting: *Deleted

## 2020-11-18 ENCOUNTER — Telehealth (INDEPENDENT_AMBULATORY_CARE_PROVIDER_SITE_OTHER): Payer: Self-pay | Admitting: Family Medicine

## 2020-11-18 ENCOUNTER — Other Ambulatory Visit: Payer: Self-pay

## 2020-11-18 VITALS — BP 109/74 | HR 82

## 2020-11-18 DIAGNOSIS — O0990 Supervision of high risk pregnancy, unspecified, unspecified trimester: Secondary | ICD-10-CM

## 2020-11-18 DIAGNOSIS — O24113 Pre-existing diabetes mellitus, type 2, in pregnancy, third trimester: Secondary | ICD-10-CM

## 2020-11-18 DIAGNOSIS — E119 Type 2 diabetes mellitus without complications: Secondary | ICD-10-CM

## 2020-11-18 DIAGNOSIS — O99353 Diseases of the nervous system complicating pregnancy, third trimester: Secondary | ICD-10-CM

## 2020-11-18 DIAGNOSIS — O24111 Pre-existing diabetes mellitus, type 2, in pregnancy, first trimester: Secondary | ICD-10-CM

## 2020-11-18 DIAGNOSIS — O09529 Supervision of elderly multigravida, unspecified trimester: Secondary | ICD-10-CM

## 2020-11-18 DIAGNOSIS — Z8751 Personal history of pre-term labor: Secondary | ICD-10-CM | POA: Insufficient documentation

## 2020-11-18 DIAGNOSIS — O09523 Supervision of elderly multigravida, third trimester: Secondary | ICD-10-CM

## 2020-11-18 DIAGNOSIS — Z789 Other specified health status: Secondary | ICD-10-CM

## 2020-11-18 DIAGNOSIS — F32A Depression, unspecified: Secondary | ICD-10-CM

## 2020-11-18 DIAGNOSIS — G43719 Chronic migraine without aura, intractable, without status migrainosus: Secondary | ICD-10-CM

## 2020-11-18 DIAGNOSIS — O99343 Other mental disorders complicating pregnancy, third trimester: Secondary | ICD-10-CM

## 2020-11-18 DIAGNOSIS — F419 Anxiety disorder, unspecified: Secondary | ICD-10-CM

## 2020-11-18 DIAGNOSIS — Z3A34 34 weeks gestation of pregnancy: Secondary | ICD-10-CM

## 2020-11-18 NOTE — Progress Notes (Signed)
I connected with Catherine Morris 11/18/20 at  1:15 PM EST by: MyChart video and verified that I am speaking with the correct person using two identifiers.  Patient is located at home and provider is located at [facility].     The purpose of this virtual visit is to provide medical care while limiting exposure to the novel coronavirus. I discussed the limitations, risks, security and privacy concerns of performing an evaluation and management service by MyChart video and the availability of in person appointments. I also discussed with the patient that there may be a patient responsible charge related to this service. By engaging in this virtual visit, you consent to the provision of healthcare.  Additionally, you authorize for your insurance to be billed for the services provided during this visit.  The patient expressed understanding and agreed to proceed.  The following staff members participated in the virtual visit:  Venora Maples, MD/MPH    PRENATAL VISIT NOTE  Subjective:  Catherine Morris is a 40 y.o. [id number] at [redacted]w[redacted]d  for phone visit for ongoing prenatal care.  She is currently monitored for the following issues for this high-risk pregnancy and has Anxiety and depression; Chronic headache; Bilateral knee pain; Intractable chronic migraine without aura and without status migrainosus; Vitamin D deficiency; Supervision of high risk pregnancy, antepartum; Language barrier; AMA (advanced maternal age) multigravida 35+; History of migraine; Gastroesophageal reflux disease; Pre-existing type 2 diabetes mellitus during pregnancy in first trimester; Vaginal bleeding in pregnancy; and History of preterm delivery on their problem list.  Patient reports no complaints.  Contractions: Not present. Vag. Bleeding: None.  Movement: Present. Denies leaking of fluid.   The following portions of the patient's history were reviewed and updated as appropriate: allergies, current  medications, past family history, past medical history, past social history, past surgical history and problem list.   Objective:   Vitals:   11/18/20 1311  BP: 109/74  Pulse: 82   Self-Obtained  Fetal Status:     Movement: Present     Assessment and Plan:  Pregnancy: G3P1102 at [redacted]w[redacted]d 1. Supervision of high risk pregnancy, antepartum BP normal Good fetal movement In person visit with swabs next visit Plans to come in to get FMLA paperwork soon, wants to be out of work 2wks before delivery (12/06/2020) and needs to have it in (per her work) by 2 weeks before going out of work  2. Antepartum multigravida of advanced maternal age   79. Pre-existing type 2 diabetes mellitus during pregnancy in first trimester On metformin 1g BID Reports sugars for the past two weeks are: Fasting all within range Four out of range post prandial Weekly antenatal testing, last BPP 8/8  4. Intractable chronic migraine without aura and without status migrainosus Still gets from time to time  5. Anxiety and depression Mood stable  6. Language barrier Spanish  Preterm labor symptoms and general obstetric precautions including but not limited to vaginal bleeding, contractions, leaking of fluid and fetal movement were reviewed in detail with the patient.  Return in 2 weeks (on 12/02/2020) for [facility], ob visit.  Future Appointments  Date Time Provider Department Center  11/24/2020  9:30 AM [facility] NURSE [facility]  11/24/2020  9:45 AM [facility] US5 [facility]  12/01/2020  1:15 PM [facility]URSE [facility]  12/01/2020  1:30 PM [facility] US3 [facility]  12/02/2020  1:15 PM Venora Maples, MD [facility]  12/08/2020  9:15 AM [facility] NURSE [facility]  12/08/2020  9:30  AM [facility] US3 [facility]  12/15/2020  9:30 AM [facility] NURSE [facility]  12/15/2020  9:45 AM [facility] US5 [facility]     Time spent on virtual visit: 15 minutes  Venora Maples, MD

## 2020-11-18 NOTE — Progress Notes (Signed)
1:05 Toria not in virtual room. I called patient with Interpreter and asked to join virtual visit. Sankalp Ferrell,RN  I connected with  Armina Ramirez-Hernandez on 11/18/20 at  1:15 PM EST by Mychart video visit  and verified that I am speaking with the correct person using two identifiers.   I discussed the limitations, risks, security and privacy concerns of performing an evaluation and management service by telephone and the availability of in person appointments. I also discussed with the patient that there may be a patient responsible charge related to this service. The patient expressed understanding and agreed to proceed.  Kortnee Bas,RN 11/18/2020  1:08 PM

## 2020-11-18 NOTE — Patient Instructions (Signed)
Infeccin por estreptococo del grupo B durante el embarazo Group B Streptococcus Infection During Pregnancy El estreptococo del grupo B (EGB) es un tipo de bacteria que se encuentra a [employer] sanas. Generalmente se encuentra en el recto, la vagina y los intestinos. En personas saludables y en mujeres no embarazadas, rara vez la bacteria provoca una enfermedad o complicaciones graves. Sin embargo, las mujeres [place] de EGB es positiva durante el embarazo pueden transmitirle la bacteria al beb en el parto. Esto puede provocar una infeccin grave en el beb despus del nacimiento. Las mujeres con EGB tambin pueden tener infecciones durante el Psychiatrist o poco despus del [place]. Las infecciones incluyen infecciones de las vas urinarias (IU) o infecciones del tero. Los EGB tambin [employer] riesgo de complicaciones durante el [place], como parto o trabajo de parto prematuros, aborto espontneo o muerte fetal. Se recomienda que todas las embarazadas se hagan pruebas de rutina para determinar la presencia de EGB. Cules son las causas? Esta afeccin es causada por la bacteria denominada Streptococcus agalactiae. Qu incrementa el riesgo? Puede tener un mayor riesgo de contraer una infeccin por EGB durante el embarazo si ya le ocurri en un embarazo previo. Cules son los signos o sntomas? En la [employer], la infeccin por EGB no causa sntomas en las [place]. Si hay sntomas, estos pueden incluir:  Inicio del trabajo de parto antes de la semana 37 de gestacin.  Una infeccin urinaria (IU) o en la vejiga. Esto puede causar fiebre, miccin frecuente o dolor y ardor al Geographical information systems officer.  Fiebre durante el [place] de [place]. Tambin puede haber latido cardaco rpido en la madre o en el beb. Los sntomas poco frecuentes pero graves de una posible infeccin por EGB en las mujeres incluyen:  Infeccin en la sangre (septicemia). Esto puede provocar fiebre, escalofros o  confusin.  Infeccin pulmonar (neumona). Esto puede provocar fiebre, escalofros, tos, respiracin rpida, dolor torcico o dificultad para respirar.  Infeccin en los huesos, las articulaciones, la piel o los tejidos blandos. Cmo se diagnostica? Le pueden realizar exmenes de deteccin de EGB entre la semana 35 y la semana 37 de gestacin. Si tiene sntomas de trabajo de Sport and exercise psychologist, Fish farm manager los exmenes de deteccin antes. Esta afeccin se diagnostica a travs de los [place] de laboratorio de:  Un hisopado del lquido de la vagina y del recto.  Lauris Poag de [place]. Cmo se trata? Esta afeccin se trata con un antibitico. Le pueden administrar antibiticos:  Al comenzar el trabajo de parto o apenas se rompa la bolsa de aguas. El uso de los medicamentos continuar hasta despus del [place]. Si tiene un parto por cesrea no necesita antibiticos, salvo que se haya roto la bolsa de [place].  Para el beb, si necesita tratamiento. El mdico controlar al beb para decidir si necesita antibiticos a fin de prevenir una infeccin grave.   Siga estas instrucciones en su casa:  Tome los medicamentos de venta libre y los recetados solamente como se lo haya indicado el mdico.  Tome su antibitico como se lo haya indicado el mdico. No deje de tomar el antibitico aunque comience a sentirse mejor.  Concurra a todas las visitas previas al parto (prenatales) y las visitas de control como se lo haya indicado el mdico. Esto es importante. Comunquese con un mdico si:  Siente dolor o ardor al Geographical information systems officer.  Tiene que orinar con ms frecuencia de lo habitual.  Tiene fiebre o escalofros.  Tiene una secrecin vaginal con mal olor. Solicite ayuda de inmediato si:  Rompe la bolsa.  Comienza el trabajo de [place].  Siente un dolor intenso en el abdomen.  Tiene dificultad para respirar.  Siente dolor en el pecho. Estos sntomas pueden representar un problema grave  que constituye Radio broadcast assistant. No espere a ver si los sntomas desaparecen. Solicite atencin mdica de inmediato. Comunquese con el servicio de emergencias de su localidad (911 en los [place]). No conduzca por sus propios medios [employer]. Resumen  El EGB es un tipo de bacteria que se encuentra con frecuencia en personas sanas.  Durante el embarazo, la colonizacin con EGB puede causar complicaciones graves para usted o el beb.  El mdico la examinar entre la semana 35 y 37 de embarazo para Chief Strategy Officer si tiene Theatre manager de EGB.  Si esto sucede Academic librarian, el mdico le recomendar antibiticos a travs de una va intravenosa durante el [place].  Despus del parto, se evaluar al beb para detectar complicaciones relacionadas con una posible infeccin por EGB, lo cual puede requerir antibiticos para prevenir una infeccin grave. Esta informacin no tiene Theme park manager el consejo del mdico. Asegrese de hacerle al mdico cualquier pregunta que tenga. Document Revised: 01/15/2020 Document Reviewed: 05/15/2019 Elsevier Patient Education  2021 Elsevier Inc.   [employer] del mtodo anticonceptivo Contraception Choices La anticoncepcin, o los mtodos anticonceptivos, hace referencia a los mtodos o dispositivos que evitan el [place]. Mtodos hormonales Implante anticonceptivo Un implante anticonceptivo consiste en un tubo delgado de plstico que contiene una hormona que evita el [place]. Es diferente de un dispositivo intrauterino (DIU). Un mdico lo inserta en la parte superior del brazo. Los implantes pueden ser eficaces durante un mximo de 3 aos. Inyecciones de progestina sola Las inyecciones de progestina sola contienen progestina, una forma sinttica de la hormona progesterona. Un mdico las administra cada 3 meses. Pldoras anticonceptivas Las pldoras anticonceptivas son pastillas que contienen hormonas que evitan el [place]. Deben tomarse  una vez al da, preferentemente a la misma Economist. Se necesita una receta para utilizar este mtodo anticonceptivo. Parche anticonceptivo El parche anticonceptivo contiene hormonas que evitan el [place]. Se coloca en la piel, debe cambiarse una vez a la semana durante tres semanas y debe retirarse en la cuarta semana. Se necesita una receta para utilizar este mtodo anticonceptivo. Anillo vaginal Un anillo vaginal contiene hormonas que evitan el embarazo. Se coloca en la vagina durante tres semanas y se retira en la cuarta semana. Luego se repite el proceso con un anillo nuevo. Se necesita una receta para utilizar este mtodo anticonceptivo. Anticonceptivo de emergencia Los anticonceptivos de emergencia son mtodos para evitar un embarazo despus de Warehouse manager sexo sin proteccin. Vienen en forma de pldora y pueden tomarse hasta 5 das despus de [place]. Funcionan mejor cuando se toman lo ms pronto posible luego de eBay. La mayora de los anticonceptivos de emergencia estn disponibles sin receta mdica. Este mtodo no debe utilizarse como el nico mtodo anticonceptivo.   Mtodos de barrera Condn masculino Un condn masculino es una vaina delgada que se coloca sobre el pene durante el sexo. Los condones evitan que el esperma ingrese en el cuerpo de la [place]. Pueden utilizarse con un una sustancia que mata a los espermatozoides (espermicida) para aumentar la efectividad. Deben desecharse despus de un uso. Condn femenino Un condn femenino es una vaina blanda y holgada que se coloca en la vagina antes de [place]. El condn  evita que el esperma ingrese en el cuerpo de la [place]. Deben desecharse despus de un uso. Diafragma Un diafragma es una barrera blanda con forma de cpula. Se inserta en la vagina antes del sexo, junto con un espermicida. El diafragma bloquea el ingreso de esperma en el tero, y el espermicida mata a los espermatozoides. El Designer, fashion/clothing en la vagina  durante 6 a 8 horas despus de Warehouse manager sexo y debe retirarse en el plazo de las 24 horas. Un diafragma es recetado y colocado por un mdico. Debe reemplazarse cada 1 a 2 aos, despus de dar a luz, de aumentar ms de 15lb (6.8kg) y de [place] plvica. Capuchn cervical Un capuchn cervical es una copa redonda y blanda de ltex o plstico que se coloca en el cuello uterino. Se inserta en la vagina antes del sexo, junto con un espermicida. Bloquea el ingreso del esperma en el tero. El capuchn Radio producer durante 6 a 8 horas despus de Warehouse manager sexo y debe retirarse en el plazo de las 48 horas. Un capuchn cervical debe ser recetado y colocado por un mdico. Debe reemplazarse cada 2aos. Esponja Una esponja es una pieza blanda y circular de espuma de poliuretano que contiene espermicida. La esponja ayuda a bloquear el ingreso de esperma en el tero, y el espermicida mata a los espermatozoides. Belva Bertin, debe humedecerla e insertarla en la vagina. Debe insertarse antes de eBay, debe permanecer dentro al menos durante 6 horas despus de tener sexo y debe retirarse y Nurse, adult en el plazo de las 30 horas. Espermicidas Los espermicidas son sustancias qumicas que matan o bloquean al esperma y no lo dejan ingresar al cuello uterino y al tero. Vienen en forma de crema, gel, supositorio, espuma o comprimido. Un espermicida debe insertarse en la vagina con un aplicador al menos 10 o 15 minutos antes de tener sexo para dar tiempo a que surta [place]. El proceso debe repetirse cada vez que tenga sexo. Los espermicidas no requieren Emergency planning/management officer.   Anticonceptivos intrauterinos Dispositivo intrauterino (DIU) Un DIU es un dispositivo en forma de T que se coloca en el tero. Existen dos tipos:  DIU hormonal.Este tipo contiene progestina, una forma sinttica de la hormona progesterona. Este tipo puede permanecer colocado durante 3 a 5 aos.  DIU de cobre.Este tipo est recubierto con  un alambre de cobre. Puede permanecer colocado durante 10 aos. Mtodos anticonceptivos permanentes Ligadura de trompas en la mujer En este mtodo, se sellan, atan u obstruyen las trompas de [place] durante una ciruga para Automotive engineer que el vulo descienda [place]. Esterilizacin histeroscpica En este mtodo, se coloca un implante pequeo y flexible dentro de cada trompa de Falopio. Los implantes hacen que se forme un tejido cicatricial en las trompas de Falopio y que las obstruya para que el espermatozoide no pueda llegar al vulo. El procedimiento demora alrededor de 3 meses para que sea [place]. Debe utilizarse otro mtodo anticonceptivo durante esos 3 meses. Esterilizacin masculina Este es un procedimiento que consiste en atar los conductos que transportan el esperma (vasectoma). Luego del procedimiento, el hombre Manufacturing engineer lquido (semen). Debe utilizarse otro mtodo anticonceptivo durante 3 meses despus del procedimiento. Mtodos de planificacin natural Planificacin familiar natural En este mtodo, la pareja no tiene [employer] la mujer podra quedar [place]. Mtodo calendario En este mtodo, la mujer realiza un seguimiento de la duracin de cada ciclo menstrual, identifica los [employer] se puede  producir un embarazo y no tiene sexo durante esos das. Mtodo de la ovulacin En este mtodo, la pareja evita tener sexo durante la ovulacin. Mtodo sintotrmico Este mtodo implica no tener sexo durante la ovulacin. Normalmente, la mujer comprueba la ovulacin al observar cambios en su temperatura y en la consistencia del moco cervical. Mtodo posovulacin En este mtodo, la pareja espera a que finalice la ovulacin para tener sexo. Dnde buscar ms informacin  Centers for Disease Control and Prevention (Centros para el Control y la Prevencin de Enfermedades): www.cdc.gov Resumen  La anticoncepcin, o los mtodos anticonceptivos, hace referencia  a los mtodos o dispositivos que evitan el embarazo.  Los mtodos anticonceptivos hormonales incluyen implantes, inyecciones, pastillas, parches, anillos vaginales y anticonceptivos de emergencia.  Los mtodos anticonceptivos de barrera pueden incluir condones masculinos, condones femeninos, diafragmas, capuchones cervicales, esponjas y espermicidas.  Existen dos tipos de DIU (dispositivo intrauterino). Un DIU puede colocarse en el tero de una mujer para evitar el embarazo durante 3 a 5 aos.  La esterilizacin permanente puede realizarse mediante un procedimiento tanto en los hombres como en las mujeres. Los mtodos de planificacin familiar natural implican no tener sexo durante los das en que la mujer podra quedar embarazada. Esta informacin no tiene como fin reemplazar el consejo del mdico. Asegrese de hacerle al mdico cualquier pregunta que tenga. Document Revised: 03/31/2020 Document Reviewed: 03/31/2020 Elsevier Patient Education  2021 Elsevier Inc.   Lactancia materna Breastfeeding  Decidir amamantar es una de las mejores elecciones que puede hacer por usted y su beb. Un cambio en las hormonas durante el embarazo hace que las mamas produzcan leche materna en las glndulas productoras de leche. Las hormonas impiden que la leche materna sea liberada antes del nacimiento del beb. Adems, impulsan el flujo de leche luego del nacimiento. Una vez que ha comenzado a amamantar, pensar en el beb, as como la succin o el llanto, pueden estimular la liberacin de leche de las glndulas productoras de leche. Los beneficios de amamantar Las investigaciones demuestran que la lactancia materna ofrece muchos beneficios de salud para bebs y madres. Adems, ofrece una forma gratuita y conveniente de alimentar al beb. Para el beb  La primera leche (calostro) ayuda a mejorar el funcionamiento del aparato digestivo del beb.  Las clulas especiales de la leche (anticuerpos) ayudan a  combatir las infecciones en el beb.  Los bebs que se alimentan con leche materna tambin tienen menos probabilidades de tener asma, alergias, obesidad o diabetes de tipo 2. Adems, tienen menor riesgo de sufrir el sndrome de muerte sbita del lactante (SMSL).  Los nutrientes de la leche materna son mejores para satisfacer las necesidades del beb en comparacin con la leche maternizada.  La leche materna mejora el desarrollo cerebral del beb. Para usted  La lactancia materna favorece el desarrollo de un vnculo muy especial entre la madre y el beb.  Es conveniente. La leche materna es econmica y siempre est disponible a la temperatura correcta.  La lactancia materna ayuda a quemar caloras. Le ayuda a perder el peso ganado durante el embarazo.  Hace que el tero vuelva al tamao que tena antes del embarazo ms rpido. Adems, disminuye el sangrado (loquios) despus del parto.  La lactancia materna contribuye a reducir el riesgo de tener diabetes de tipo 2, osteoporosis, artritis reumatoide, enfermedades cardiovasculares y cncer de mama, ovario, tero y endometrio en el futuro. Informacin bsica sobre la lactancia Comienzo de la lactancia  Encuentre un lugar cmodo para sentarse o acostarse, con   un buen respaldo para el cuello y la espalda.  Coloque una almohada o una manta enrollada debajo del beb para acomodarlo a la altura de la mama (si est sentada). Las almohadas para Museum/gallery exhibitions officer se han diseado especialmente a fin de servir de apoyo para los brazos y el beb [employer].  Asegrese de que la barriga del beb (abdomen) est frente a la suya.  [place]ee suavemente la mama. Con las yemas de los dedos, [employer] bordes exteriores de la mama hacia adentro, en direccin al pezn. Esto estimula el flujo de [place]. Si la [employer], es posible que deba Educational psychologist con este movimiento durante la Market researcher.  Sostenga la mama con 4 dedos por debajo y Multimedia programmer por  arriba del pezn (forme la letra "C" con la mano). Asegrese de que los dedos se encuentren lejos del pezn y de la boca del beb.  Empuje suavemente los labios del beb con el pezn o con el dedo.  Cuando la boca del beb se abra lo suficiente, acrquelo rpidamente a la mama e introduzca todo el pezn y la arola, tanto como sea posible, dentro de la boca del beb. La arola es la zona de color que rodea al pezn. ? Debe haber ms arola visible por arriba del labio superior del beb que por debajo del labio inferior. ? Los labios del beb deben estar abiertos y extendidos hacia afuera (evertidos) para asegurar que el beb se prenda de forma adecuada y cmoda. ? La lengua del beb debe estar entre la enca inferior y Educational psychologist.  Asegrese de que la boca del beb est en la posicin correcta alrededor del pezn (prendido). Los labios del beb deben crear un sello sobre la mama y estar doblados hacia afuera (invertidos).  Es comn que el beb succione durante 2 a 3 minutos para que comience el flujo de [place]. Cmo debe prenderse Es muy importante que le ensee al beb cmo prenderse adecuadamente a la mama. Si el beb no se prende adecuadamente, puede causar [employer], reducir la produccin de [place] materna y Radio producer que el beb tenga un escaso aumento de [place]. Adems, si el beb no se prende adecuadamente al pezn, puede tragar aire durante la alimentacin. Esto puede causarle molestias al beb. Hacer eructar al beb al Pilar Plate de mama puede ayudarlo a liberar el aire. Sin embargo, ensearle al beb cmo prenderse a la mama adecuadamente es la mejor manera de evitar que se sienta molesto por tragar Oceanographer se alimenta. Signos de que el beb se ha prendido adecuadamente al pezn  Tironea o succiona de modo silencioso, sin Publishing rights manager. Los labios del beb deben estar extendidos hacia afuera (evertidos).  Se escucha que traga cada 3 o 4 succiones una vez que la [employer] ha  comenzado a Radiographer, therapeutic (despus de que se produzca el reflejo de eyeccin de la [place]).  Hay movimientos musculares por arriba y por delante de sus odos al Printmaker. Signos de que el beb no se ha prendido Audiological scientist al pezn  Hace ruidos de succin o de chasquido mientras se Tree surgeon.  Siente dolor en los pezones. Si cree que el beb no se prendi correctamente, deslice el dedo en la comisura de la boca y [employer] Corporation las encas del beb para interrumpir la succin. Intente volver a comenzar a Museum/gallery exhibitions officer. Signos de Market researcher materna exitosa Signos del beb  El beb disminuir gradualmente el nmero de succiones o dejar de succionar por completo.  El beb  se quedar dormido.  El cuerpo del beb se relajar.  El beb retendr Neomia Dear pequea cantidad de [facility] boca.  El beb se desprender solo del [place]. Signos que presenta usted  Las mamas han aumentado la firmeza, el peso y el tamao 1 a 3 horas despus de Museum/gallery exhibitions officer.  Estn ms blandas inmediatamente despus de amamantar.  Se producen un aumento del volumen de [place] y un cambio en su consistencia y color hacia el quinto da de Market researcher.  Los pezones no duelen, no estn agrietados ni sangran. Signos de que su beb recibe la cantidad de leche suficiente  Mojar por lo menos 1 o 2paales durante las primeras 24horas despus del nacimiento.  Mojar por lo menos 5 o 6paales cada 24horas durante la primera semana despus del nacimiento. La orina debe ser clara o de color amarillo plido a los 5das de vida.  Mojar entre 6 y 8paales cada 24horas a medida que el beb sigue creciendo y desarrollndose.  Defeca por lo menos 3 veces en 24 horas a los 5 [address] de [address] Las heces deben ser blandas y Armed forces operational officer.  Defeca por lo menos 3 veces en 24 horas a los [address] de [address] Las heces deben ser grumosas y Armed forces operational officer.  No registra una prdida de peso mayor al 10% del peso al nacer durante los primeros 3 [address]  [address] de  [place].  Aumenta de peso un promedio de 4 a 7onzas (113 a 198g) por semana despus de los 4 [address] de vida.  Aumenta de [place], [place], de [place] uniforme a Glass blower/designer de los 5 [address] de vida, sin Passenger transport manager prdida de peso despus de las 2semanas de vida. Despus de alimentarse, es posible que el beb regurgite una pequea cantidad de [place]. Esto es normal. Frecuencia y duracin de la lactancia El amamantamiento frecuente la ayudar a producir ms [place] y puede prevenir dolores en los pezones y las mamas extremadamente llenas (congestin [place]). Alimente al beb cuando muestre signos de hambre o si siente la necesidad de reducir la congestin de las [place]. Esto se denomina "lactancia a demanda". Las seales de que el beb tiene hambre incluyen las siguientes:  Aumento del [place] de [place], [place] o inquietud.  Mueve la cabeza de un lado a otro.  Abre la boca cuando se le toca la mejilla o la comisura de la boca (reflejo de bsqueda).  Aumenta las vocalizaciones, tales como sonidos de succin, se relame los labios, emite arrullos, suspiros o chirridos.  Mueve la [employer] boca y se chupa los dedos o las manos.  Est molesto o llora. Evite el uso del chupete en las primeras 4 a 6 semanas despus del nacimiento del beb. Despus de este perodo, podr usar un chupete. Las investigaciones demostraron que el uso del chupete durante Financial risk analyst ao de vida del beb disminuye el riesgo de tener el sndrome de muerte sbita del lactante (SMSL). Permita que el nio se alimente en cada mama todo lo que desee. Cuando el beb se desprende o se queda dormido mientras se est alimentando de la primera mama, ofrzcale la segunda. Debido a que, con frecuencia, los recin nacidos estn somnolientos las primeras semanas de vida, es posible que deba despertar al beb para alimentarlo. Los horarios de Acupuncturist de un beb a otro. Sin embargo, las siguientes reglas pueden servir como gua para ayudarla a  Lawyer que el beb se alimenta adecuadamente:  Se puede amamantar a los recin nacidos (bebs de 4 semanas o menos de  vida) cada 1 a 3 horas.  No deben transcurrir ms de 3 horas durante el da o 5 horas durante la noche sin que se amamante a los recin nacidos.  Debe amamantar al beb un mnimo de 8 veces en un perodo de 24 horas. Extraccin de [employer] extraccin y Contractor de la leche materna le permiten asegurarse de que el beb se alimente exclusivamente de su leche materna, aun en momentos en los que no puede Museum/gallery exhibitions officer. Esto tiene especial importancia si debe regresar al Aleen Campi en el perodo en que an est amamantando o si no puede estar presente en los momentos en que el beb debe alimentarse. Su asesor en lactancia puede ayudarla a Clinical research associate un mtodo de extraccin que funcione mejor para usted y Programmer, systems cunto tiempo es seguro almacenar [place].      Cmo cuidar las mamas durante la lactancia Los pezones pueden secarse, Lobbyist y doler durante la Market researcher. Las siguientes recomendaciones pueden ayudarla a Pharmacologist las [employer] y sanas:  Careers information officer usar jabn en los pezones.  Use un sostn de soporte diseado especialmente para la lactancia materna. Evite usar sostenes con aro o sostenes muy ajustados (sostenes deportivos).  Seque al aire sus pezones durante 3 a despus de amamantar al beb.  Utilice solo apsitos de Haematologist sostn para Environmental health practitioner las prdidas de [place]. La prdida de un poco de [employer] tomas es normal.  Utilice lanolina sobre los pezones luego de Museum/gallery exhibitions officer. La lanolina ayuda a mantener la humedad normal de la piel. La lanolina pura no es perjudicial (no es txica) para el beb. Adems, puede extraer [employer] algunas gotas de [place] materna y Engineer, maintenance (IT) suavemente esa [employer] pezones para que la [place] se seque al aire. Durante las primeras semanas despus del nacimiento, algunas mujeres  experimentan [place]. La congestin [employer] puede hacer que sienta las mamas pesadas, calientes y sensibles al tacto. El pico de la congestin mamaria ocurre en el plazo de los 3 a 5 das despus del [place]. Las siguientes recomendaciones pueden ayudarla a Paramedic la congestin mamaria:  Vace por completo las mamas al [employer] o Environmental health practitioner. Puede aplicar calor hmedo en las mamas (en la ducha o con toallas hmedas para manos) antes de Museum/gallery exhibitions officer o extraer [employer]. Esto aumenta la circulacin y [place] a que la [place]. Si el beb no vaca por completo las [address] cuando lo [address], extraiga la [place] restante despus de que haya finalizado.  Aplique compresas de hielo [employer] inmediatamente despus de Museum/gallery exhibitions officer o extraer [place], a menos que le resulte demasiado incmodo. Haga lo siguiente: ? Ponga el hielo en una bolsa plstica. ? Coloque una [employer] piel y la bolsa de hielo. ? Coloque el hielo durante , 2 o 3veces por da.  Asegrese de que el beb est prendido y se encuentre en la posicin correcta mientras lo alimenta. Si la congestin mamaria persiste luego de 48 horas o despus de seguir estas recomendaciones, comunquese con su mdico o un Holiday representative. Recomendaciones de salud general durante la lactancia  Consuma 3 comidas y 3 colaciones saludables todos los [place]. Las [employer] bien alimentadas que amamantan necesitan entre 450 y 500 caloras adicionales por Futures trader. Puede cumplir con este requisito al aumentar la cantidad de una dieta equilibrada que realice.  Beba suficiente agua para mantener la orina clara o de color amarillo plido.  Descanse con frecuencia, reljese y siga tomando sus vitaminas prenatales para  prevenir la fatiga, el estrs y los niveles bajos de vitaminas y [employer] en el cuerpo (deficiencias de nutrientes).  No consuma ningn producto que contenga nicotina o tabaco, como cigarrillos y Administrator, Civil Service. El beb puede  verse afectado por las sustancias qumicas de los cigarrillos que pasan a la [place] materna y por la exposicin al humo ambiental del tabaco. Si necesita ayuda para dejar de fumar, consulte al mdico.  Evite el consumo de alcohol.  No consuma drogas ilegales o marihuana.  Antes de Dietitian, hable con el mdico. Estos incluyen medicamentos recetados y de [employer], como tambin vitaminas y suplementos a base de hierbas. Algunos medicamentos, que pueden ser perjudiciales para el beb, pueden pasar a travs de la [employer].  Puede quedar embarazada durante la lactancia. Si se desea un mtodo anticonceptivo, consulte al mdico sobre cules son las opciones seguras durante la Market researcher. Dnde encontrar ms informacin: Liga internacional [place]: [email or web address]/. Comunquese con un mdico si:  Siente que quiere dejar de Museum/gallery exhibitions officer o se siente frustrada con la lactancia.  Sus pezones estn agrietados o Water quality scientist.  Sus mamas estn irritadas, sensibles o calientes.  Tiene los siguientes sntomas: ? Dolor en las mamas o en los pezones. ? Un rea hinchada en cualquiera de las mamas. ? Grant Ruts o escalofros. ? Nuseas o vmitos. ? Drenaje de otro lquido distinto de la [employer] materna desde los pezones.  Sus mamas no se llenan antes de Museum/gallery exhibitions officer al beb para el quinto da despus del [place].  Se siente triste y deprimida.  El beb: ? Est demasiado somnoliento como para comer bien. ? Tiene problemas para dormir. ? Tiene ms de 1 semana de vida y [employer] de 6 paales en un periodo de 24 horas. ? No ha aumentado de [place] a los [address] de [address]  El beb defeca menos de 3 veces en 24 horas.  La piel del beb o las partes blancas de los ojos se vuelven amarillentas. Solicite ayuda de inmediato si:  El beb est muy cansado Retail buyer) y no se quiere despertar para comer.  Le sube la fiebre sin causa. Resumen  La lactancia materna ofrece muchos beneficios de salud para bebs  y [place].  Intente amamantar a su beb cuando muestre signos tempranos de hambre.  Haga cosquillas o empuje suavemente los labios del beb con el dedo o el pezn para lograr que el beb abra la boca. Acerque el beb a la mama. Asegrese de que la mayor parte de la arola se encuentre dentro de la boca del beb. Ofrzcale una mama y haga eructar al beb antes de pasar a la otra.  Hable con su mdico o asesor en lactancia si tiene dudas o problemas con la lactancia. Esta informacin no tiene Theme park manager el consejo del mdico. Asegrese de hacerle al mdico cualquier pregunta que tenga. Document Revised: 11/23/2017 Document Reviewed: 12/19/2016 Elsevier Patient Education  2021 [employer].

## 2020-11-24 ENCOUNTER — Ambulatory Visit: Payer: Self-pay | Admitting: *Deleted

## 2020-11-24 ENCOUNTER — Encounter: Payer: Self-pay | Admitting: *Deleted

## 2020-11-24 ENCOUNTER — Other Ambulatory Visit: Payer: Self-pay

## 2020-11-24 ENCOUNTER — Ambulatory Visit: Payer: Self-pay | Attending: Obstetrics

## 2020-11-24 DIAGNOSIS — Z7984 Long term (current) use of oral hypoglycemic drugs: Secondary | ICD-10-CM

## 2020-11-24 DIAGNOSIS — O09213 Supervision of pregnancy with history of pre-term labor, third trimester: Secondary | ICD-10-CM

## 2020-11-24 DIAGNOSIS — O24113 Pre-existing diabetes mellitus, type 2, in pregnancy, third trimester: Secondary | ICD-10-CM

## 2020-11-24 DIAGNOSIS — O0990 Supervision of high risk pregnancy, unspecified, unspecified trimester: Secondary | ICD-10-CM

## 2020-11-24 DIAGNOSIS — F319 Bipolar disorder, unspecified: Secondary | ICD-10-CM

## 2020-11-24 DIAGNOSIS — Z3A35 35 weeks gestation of pregnancy: Secondary | ICD-10-CM

## 2020-11-24 DIAGNOSIS — O09523 Supervision of elderly multigravida, third trimester: Secondary | ICD-10-CM | POA: Insufficient documentation

## 2020-11-24 DIAGNOSIS — F413 Other mixed anxiety disorders: Secondary | ICD-10-CM

## 2020-11-24 DIAGNOSIS — O99343 Other mental disorders complicating pregnancy, third trimester: Secondary | ICD-10-CM

## 2020-11-24 DIAGNOSIS — O4593 Premature separation of placenta, unspecified, third trimester: Secondary | ICD-10-CM

## 2020-12-01 ENCOUNTER — Other Ambulatory Visit: Payer: Self-pay

## 2020-12-01 ENCOUNTER — Encounter: Payer: Self-pay | Admitting: *Deleted

## 2020-12-01 ENCOUNTER — Ambulatory Visit: Payer: Self-pay | Admitting: *Deleted

## 2020-12-01 ENCOUNTER — Ambulatory Visit: Payer: Self-pay | Attending: Pediatrics

## 2020-12-01 DIAGNOSIS — E119 Type 2 diabetes mellitus without complications: Secondary | ICD-10-CM

## 2020-12-01 DIAGNOSIS — O0990 Supervision of high risk pregnancy, unspecified, unspecified trimester: Secondary | ICD-10-CM | POA: Insufficient documentation

## 2020-12-01 DIAGNOSIS — O99343 Other mental disorders complicating pregnancy, third trimester: Secondary | ICD-10-CM

## 2020-12-01 DIAGNOSIS — F32A Depression, unspecified: Secondary | ICD-10-CM

## 2020-12-01 DIAGNOSIS — O24113 Pre-existing diabetes mellitus, type 2, in pregnancy, third trimester: Secondary | ICD-10-CM | POA: Insufficient documentation

## 2020-12-01 DIAGNOSIS — O09213 Supervision of pregnancy with history of pre-term labor, third trimester: Secondary | ICD-10-CM

## 2020-12-01 DIAGNOSIS — O09523 Supervision of elderly multigravida, third trimester: Secondary | ICD-10-CM

## 2020-12-01 DIAGNOSIS — O4593 Premature separation of placenta, unspecified, third trimester: Secondary | ICD-10-CM

## 2020-12-01 DIAGNOSIS — Z3A36 36 weeks gestation of pregnancy: Secondary | ICD-10-CM

## 2020-12-01 DIAGNOSIS — F419 Anxiety disorder, unspecified: Secondary | ICD-10-CM

## 2020-12-01 NOTE — Progress Notes (Signed)
   Subjective:  Catherine Morris is a 40 y.o. [id number] at [redacted]w[redacted]d being seen today for ongoing prenatal care.  She is currently monitored for the following issues for this high-risk pregnancy and has Anxiety and depression; Chronic headache; Bilateral knee pain; Intractable chronic migraine without aura and without status migrainosus; Vitamin D deficiency; Supervision of high risk pregnancy, antepartum; Language barrier; AMA (advanced maternal age) multigravida 35+; History of migraine; Gastroesophageal reflux disease; Pre-existing type 2 diabetes mellitus during pregnancy in first trimester; Vaginal bleeding in pregnancy; and History of preterm delivery on their problem list.  Patient reports no complaints.  Contractions: Not present. Vag. Bleeding: None.  Movement: Present. Denies leaking of fluid.   The following portions of the patient's history were reviewed and updated as appropriate: allergies, current medications, past family history, past medical history, past social history, past surgical history and problem list. Problem list updated.  Objective:   Vitals:   12/02/20 1353  BP: 114/76  Pulse: 83  Weight: 133 lb 14.4 oz (60.7 kg)    Fetal Status: Fetal Heart Rate (bpm): 129   Movement: Present     General:  Alert, oriented and cooperative. Patient is in no acute distress.  Skin: Skin is warm and dry. No rash noted.   Cardiovascular: Normal heart rate noted  Respiratory: Normal respiratory effort, no problems with respiration noted  Abdomen: Soft, gravid, appropriate for gestational age. Pain/Pressure: Absent     Pelvic: Vag. Bleeding: None     Cervical exam deferred        Extremities: Normal range of motion.  Edema: None  Mental Status: Normal mood and affect. Normal behavior. Normal judgment and thought content.   Urinalysis:      Assessment and Plan:  Pregnancy: G3P1102 at [redacted]w[redacted]d  1. Supervision of high risk pregnancy, antepartum BP and FHR normal Swabs  today IOL scheduled for 12/21/2020 Form faxed and orders placed  2. Language barrier Spanish  3. History of preterm delivery At 35 weeks  4. Multigravida of advanced maternal age in third trimester   5. Vaginal bleeding in pregnancy Admitted 09/2020, stable since then  6. Intractable chronic migraine without aura and without status migrainosus Reports headaches are coming and going Relieved with rest and tylenol  7. Pre-existing type 2 diabetes mellitus during pregnancy in first trimester On metformin 1g BID Sugar log shows 90% at goal Cont current regimen Last growth 3/15 EFW 53%, 2685g, normal AFI Following w MFM for weekly BPP  Preterm labor symptoms and general obstetric precautions including but not limited to vaginal bleeding, contractions, leaking of fluid and fetal movement were reviewed in detail with the patient. Please refer to After Visit Summary for other counseling recommendations.  Return in 1 week (on 12/09/2020).   Venora Maples, MD

## 2020-12-02 ENCOUNTER — Other Ambulatory Visit (HOSPITAL_COMMUNITY)
Admission: RE | Admit: 2020-12-02 | Discharge: 2020-12-02 | Disposition: A | Discharge status: Home / Self Care | Payer: Medicaid Other | Source: Ambulatory Visit | Attending: Family Medicine | Admitting: Family Medicine

## 2020-12-02 ENCOUNTER — Ambulatory Visit (INDEPENDENT_AMBULATORY_CARE_PROVIDER_SITE_OTHER): Payer: Self-pay | Admitting: Family Medicine

## 2020-12-02 VITALS — BP 114/76 | HR 83 | Wt 133.9 lb

## 2020-12-02 DIAGNOSIS — O09523 Supervision of elderly multigravida, third trimester: Secondary | ICD-10-CM

## 2020-12-02 DIAGNOSIS — O24111 Pre-existing diabetes mellitus, type 2, in pregnancy, first trimester: Secondary | ICD-10-CM

## 2020-12-02 DIAGNOSIS — O0990 Supervision of high risk pregnancy, unspecified, unspecified trimester: Secondary | ICD-10-CM

## 2020-12-02 DIAGNOSIS — Z789 Other specified health status: Secondary | ICD-10-CM

## 2020-12-02 DIAGNOSIS — Z8751 Personal history of pre-term labor: Secondary | ICD-10-CM

## 2020-12-02 DIAGNOSIS — O4690 Antepartum hemorrhage, unspecified, unspecified trimester: Secondary | ICD-10-CM

## 2020-12-02 DIAGNOSIS — G43719 Chronic migraine without aura, intractable, without status migrainosus: Secondary | ICD-10-CM

## 2020-12-02 NOTE — Patient Instructions (Signed)
 Eleccin del mtodo anticonceptivo Contraception Choices La anticoncepcin, o los mtodos anticonceptivos, hace referencia a los mtodos o dispositivos que evitan el embarazo. Mtodos hormonales Implante anticonceptivo Un implante anticonceptivo consiste en un tubo delgado de plstico que contiene una hormona que evita el embarazo. Es diferente de un dispositivo intrauterino (DIU). Un mdico lo inserta en la parte superior del brazo. Los implantes pueden ser eficaces durante un mximo de 3 aos. Inyecciones de progestina sola Las inyecciones de progestina sola contienen progestina, una forma sinttica de la hormona progesterona. Un mdico las administra cada 3 meses. Pldoras anticonceptivas Las pldoras anticonceptivas son pastillas que contienen hormonas que evitan el embarazo. Deben tomarse una vez al da, preferentemente a la misma hora cada da. Se necesita una receta para utilizar este mtodo anticonceptivo. Parche anticonceptivo El parche anticonceptivo contiene hormonas que evitan el embarazo. Se coloca en la piel, debe cambiarse una vez a la semana durante tres semanas y debe retirarse en la cuarta semana. Se necesita una receta para utilizar este mtodo anticonceptivo. Anillo vaginal Un anillo vaginal contiene hormonas que evitan el embarazo. Se coloca en la vagina durante tres semanas y se retira en la cuarta semana. Luego se repite el proceso con un anillo nuevo. Se necesita una receta para utilizar este mtodo anticonceptivo. Anticonceptivo de emergencia Los anticonceptivos de emergencia son mtodos para evitar un embarazo despus de tener sexo sin proteccin. Vienen en forma de pldora y pueden tomarse hasta 5 das despus de tener sexo. Funcionan mejor cuando se toman lo ms pronto posible luego de tener sexo. La mayora de los anticonceptivos de emergencia estn disponibles sin receta mdica. Este mtodo no debe utilizarse como el nico mtodo anticonceptivo.   Mtodos de  barrera Condn masculino Un condn masculino es una vaina delgada que se coloca sobre el pene durante el sexo. Los condones evitan que el esperma ingrese en el cuerpo de la mujer. Pueden utilizarse con un una sustancia que mata a los espermatozoides (espermicida) para aumentar la efectividad. Deben desecharse despus de un uso. Condn femenino Un condn femenino es una vaina blanda y holgada que se coloca en la vagina antes de tener sexo. El condn evita que el esperma ingrese en el cuerpo de la mujer. Deben desecharse despus de un uso. Diafragma Un diafragma es una barrera blanda con forma de cpula. Se inserta en la vagina antes del sexo, junto con un espermicida. El diafragma bloquea el ingreso de esperma en el tero, y el espermicida mata a los espermatozoides. El diafragma debe permanecer en la vagina durante 6 a 8 horas despus de tener sexo y debe retirarse en el plazo de las 24 horas. Un diafragma es recetado y colocado por un mdico. Debe reemplazarse cada 1 a 2 aos, despus de dar a luz, de aumentar ms de 15lb (6.8kg) y de una ciruga plvica. Capuchn cervical Un capuchn cervical es una copa redonda y blanda de ltex o plstico que se coloca en el cuello uterino. Se inserta en la vagina antes del sexo, junto con un espermicida. Bloquea el ingreso del esperma en el tero. El capuchn debe permanecer en el lugar durante 6 a 8 horas despus de tener sexo y debe retirarse en el plazo de las 48 horas. Un capuchn cervical debe ser recetado y colocado por un mdico. Debe reemplazarse cada 2aos. Esponja Una esponja es una pieza blanda y circular de espuma de poliuretano que contiene espermicida. La esponja ayuda a bloquear el ingreso de esperma en el tero, y el   espermicida mata a los espermatozoides. Para utilizarla, debe humedecerla e insertarla en la vagina. Debe insertarse antes de tener sexo, debe permanecer dentro al menos durante 6 horas despus de tener sexo y debe retirarse y  desecharse en el plazo de las 30 horas. Espermicidas Los espermicidas son sustancias qumicas que matan o bloquean al esperma y no lo dejan ingresar al cuello uterino y al tero. Vienen en forma de crema, gel, supositorio, espuma o comprimido. Un espermicida debe insertarse en la vagina con un aplicador al menos 10 o 15 minutos antes de tener sexo para dar tiempo a que surta efecto. El proceso debe repetirse cada vez que tenga sexo. Los espermicidas no requieren receta mdica.   Anticonceptivos intrauterinos Dispositivo intrauterino (DIU) Un DIU es un dispositivo en forma de T que se coloca en el tero. Existen dos tipos:  DIU hormonal.Este tipo contiene progestina, una forma sinttica de la hormona progesterona. Este tipo puede permanecer colocado durante 3 a 5 aos.  DIU de cobre.Este tipo est recubierto con un alambre de cobre. Puede permanecer colocado durante 10 aos. Mtodos anticonceptivos permanentes Ligadura de trompas en la mujer En este mtodo, se sellan, atan u obstruyen las trompas de Falopio durante una ciruga para evitar que el vulo descienda hacia el tero. Esterilizacin histeroscpica En este mtodo, se coloca un implante pequeo y flexible dentro de cada trompa de Falopio. Los implantes hacen que se forme un tejido cicatricial en las trompas de Falopio y que las obstruya para que el espermatozoide no pueda llegar al vulo. El procedimiento demora alrededor de 3 meses para que sea efectivo. Debe utilizarse otro mtodo anticonceptivo durante esos 3 meses. Esterilizacin masculina Este es un procedimiento que consiste en atar los conductos que transportan el esperma (vasectoma). Luego del procedimiento, el hombre puede eyacular lquido (semen). Debe utilizarse otro mtodo anticonceptivo durante 3 meses despus del procedimiento. Mtodos de planificacin natural Planificacin familiar natural En este mtodo, la pareja no tiene sexo durante los das en que la mujer podra quedar  embarazada. Mtodo calendario En este mtodo, la mujer realiza un seguimiento de la duracin de cada ciclo menstrual, identifica los das en los que se puede producir un embarazo y no tiene sexo durante esos das. Mtodo de la ovulacin En este mtodo, la pareja evita tener sexo durante la ovulacin. Mtodo sintotrmico Este mtodo implica no tener sexo durante la ovulacin. Normalmente, la mujer comprueba la ovulacin al observar cambios en su temperatura y en la consistencia del moco cervical. Mtodo posovulacin En este mtodo, la pareja espera a que finalice la ovulacin para tener sexo. Dnde buscar ms informacin  Centers for Disease Control and Prevention (Centros para el Control y la Prevencin de Enfermedades): www.cdc.gov Resumen  La anticoncepcin, o los mtodos anticonceptivos, hace referencia a los mtodos o dispositivos que evitan el embarazo.  Los mtodos anticonceptivos hormonales incluyen implantes, inyecciones, pastillas, parches, anillos vaginales y anticonceptivos de emergencia.  Los mtodos anticonceptivos de barrera pueden incluir condones masculinos, condones femeninos, diafragmas, capuchones cervicales, esponjas y espermicidas.  Existen dos tipos de DIU (dispositivo intrauterino). Un DIU puede colocarse en el tero de una mujer para evitar el embarazo durante 3 a 5 aos.  La esterilizacin permanente puede realizarse mediante un procedimiento tanto en los hombres como en las mujeres. Los mtodos de planificacin familiar natural implican no tener sexo durante los das en que la mujer podra quedar embarazada. Esta informacin no tiene como fin reemplazar el consejo del mdico. Asegrese de hacerle al mdico cualquier pregunta que   tenga. Document Revised: 03/31/2020 Document Reviewed: 03/31/2020 Elsevier Patient Education  2021 Else[employer] Inc.   Lactancia materna Breastfeeding  Decidir amamantar es una de las mejores elecciones que puede hacer por usted y su  beb. Un cambio en las hormonas durante el embarazo hace que las mamas produzcan leche materna en las glndulas productoras de leche. Las hormonas impiden que la leche materna sea liberada antes del nacimiento del beb. Adems, impulsan el flujo de leche luego del nacimiento. Una vez que ha comenzado a amamantar, pensar en el beb, as como la succin o el llanto, pueden estimular la liberacin de leche de las glndulas productoras de leche. Los beneficios de amamantar Las investigaciones demuestran que la lactancia materna ofrece muchos beneficios de salud para bebs y madres. Adems, ofrece una forma gratuita y conveniente de alimentar al beb. Para el beb  La primera leche (calostro) ayuda a mejorar el funcionamiento del aparato digestivo del beb.  Las clulas especiales de la leche (anticuerpos) ayudan a combatir las infecciones en el beb.  Los bebs que se alimentan con leche materna tambin tienen menos probabilidades de tener asma, alergias, obesidad o diabetes de tipo 2. Adems, tienen menor riesgo de sufrir el sndrome de muerte sbita del lactante (SMSL).  Los nutrientes de la leche materna son mejores para satisfacer las necesidades del beb en comparacin con la leche maternizada.  La leche materna mejora el desarrollo cerebral del beb. Para usted  La lactancia materna favorece el desarrollo de un vnculo muy especial entre la madre y el beb.  Es conveniente. La leche materna es econmica y siempre est disponible a la temperatura correcta.  La lactancia materna ayuda a quemar caloras. Le ayuda a perder el peso ganado durante el embarazo.  Hace que el tero vuelva al tamao que tena antes del embarazo ms rpido. Adems, disminuye el sangrado (loquios) despus del parto.  La lactancia materna contribuye a reducir el riesgo de tener diabetes de tipo 2, osteoporosis, artritis reumatoide, enfermedades cardiovasculares y cncer de mama, ovario, tero y endometrio en el  futuro. Informacin bsica sobre la lactancia Comienzo de la lactancia  Encuentre un lugar cmodo para sentarse o acostarse, con un buen respaldo para el cuello y la espalda.  Coloque una almohada o una manta enrollada debajo del beb para acomodarlo a la altura de la mama (si est sentada). Las almohadas para amamantar se han diseado especialmente a fin de servir de apoyo para los brazos y el beb mientras amamanta.  Asegrese de que la barriga del beb (abdomen) est frente a la suya.  Masajee suavemente la mama. Con las yemas de los dedos, masajee los bordes exteriores de la mama hacia adentro, en direccin al pezn. Esto estimula el flujo de leche. Si la leche fluye lentamente, es posible que deba continuar con este movimiento durante la lactancia.  Sostenga la mama con 4 dedos por debajo y el pulgar por arriba del pezn (forme la letra "C" con la mano). Asegrese de que los dedos se encuentren lejos del pezn y de la boca del beb.  Empuje suavemente los labios del beb con el pezn o con el dedo.  Cuando la boca del beb se abra lo suficiente, acrquelo rpidamente a la mama e introduzca todo el pezn y la arola, tanto como sea posible, dentro de la boca del beb. La arola es la zona de color que rodea al pezn. ? Debe haber ms arola visible por arriba del labio superior del beb que por debajo del labio   inferior. ? Los labios del beb deben estar abiertos y extendidos hacia afuera (evertidos) para asegurar que el beb se prenda de forma adecuada y cmoda. ? La lengua del beb debe estar entre la enca inferior y la mama.  Asegrese de que la boca del beb est en la posicin correcta alrededor del pezn (prendido). Los labios del beb deben crear un sello sobre la mama y estar doblados hacia afuera (invertidos).  Es comn que el beb succione durante 2 a 3 minutos para que comience el flujo de leche materna. Cmo debe prenderse Es muy importante que le ensee al beb cmo  prenderse adecuadamente a la mama. Si el beb no se prende adecuadamente, puede causar dolor en los pezones, reducir la produccin de leche materna y hacer que el beb tenga un escaso aumento de peso. Adems, si el beb no se prende adecuadamente al pezn, puede tragar aire durante la alimentacin. Esto puede causarle molestias al beb. Hacer eructar al beb al cambiar de mama puede ayudarlo a liberar el aire. Sin embargo, ensearle al beb cmo prenderse a la mama adecuadamente es la mejor manera de evitar que se sienta molesto por tragar aire mientras se alimenta. Signos de que el beb se ha prendido adecuadamente al pezn  Tironea o succiona de modo silencioso, sin causarle dolor. Los labios del beb deben estar extendidos hacia afuera (evertidos).  Se escucha que traga cada 3 o 4 succiones una vez que la leche ha comenzado a fluir (despus de que se produzca el reflejo de eyeccin de la leche).  Hay movimientos musculares por arriba y por delante de sus odos al succionar. Signos de que el beb no se ha prendido adecuadamente al pezn  Hace ruidos de succin o de chasquido mientras se alimenta.  Siente dolor en los pezones. Si cree que el beb no se prendi correctamente, deslice el dedo en la comisura de la boca y colquelo entre las encas del beb para interrumpir la succin. Intente volver a comenzar a amamantar. Signos de lactancia materna exitosa Signos del beb  El beb disminuir gradualmente el nmero de succiones o dejar de succionar por completo.  El beb se quedar dormido.  El cuerpo del beb se relajar.  El beb retendr una pequea cantidad de leche en la boca.  El beb se desprender solo del pecho. Signos que presenta usted  Las mamas han aumentado la firmeza, el peso y el tamao 1 a 3 horas despus de amamantar.  Estn ms blandas inmediatamente despus de amamantar.  Se producen un aumento del volumen de leche y un cambio en su consistencia y color hacia el  quinto da de lactancia.  Los pezones no duelen, no estn agrietados ni sangran. Signos de que su beb recibe la cantidad de leche suficiente  Mojar por lo menos 1 o 2paales durante las primeras 24horas despus del nacimiento.  Mojar por lo menos 5 o 6paales cada 24horas durante la primera semana despus del nacimiento. La orina debe ser clara o de color amarillo plido a los 5das de vida.  Mojar entre 6 y 8paales cada 24horas a medida que el beb sigue creciendo y desarrollndose.  Defeca por lo menos 3 veces en 24 horas a los 5 das de vida. Las heces deben ser blandas y amarillentas.  Defeca por lo menos 3 veces en 24 horas a los 7 das de vida. Las heces deben ser grumosas y amarillentas.  No registra una prdida de peso mayor al 10% del peso al   nacer durante los primeros 3 das de vida.  Aumenta de peso un promedio de 4 a 7onzas (113 a 198g) por semana despus de los 4 das de vida.  Aumenta de peso, diariamente, de manera uniforme a partir de los 5 das de vida, sin registrar prdida de peso despus de las 2semanas de vida. Despus de alimentarse, es posible que el beb regurgite una pequea cantidad de leche. Esto es normal. Frecuencia y duracin de la lactancia El amamantamiento frecuente la ayudar a producir ms leche y puede prevenir dolores en los pezones y las mamas extremadamente llenas (congestin mamaria). Alimente al beb cuando muestre signos de hambre o si siente la necesidad de reducir la congestin de las mamas. Esto se denomina "lactancia a demanda". Las seales de que el beb tiene hambre incluyen las siguientes:  Aumento del estado de alerta, actividad o inquietud.  Mueve la cabeza de un lado a otro.  Abre la boca cuando se le toca la mejilla o la comisura de la boca (reflejo de bsqueda).  Aumenta las vocalizaciones, tales como sonidos de succin, se relame los labios, emite arrullos, suspiros o chirridos.  Mueve la mano hacia la boca y se chupa  los dedos o las manos.  Est molesto o llora. Evite el uso del chupete en las primeras 4 a 6 semanas despus del nacimiento del beb. Despus de este perodo, podr usar un chupete. Las investigaciones demostraron que el uso del chupete durante el primer ao de vida del beb disminuye el riesgo de tener el sndrome de muerte sbita del lactante (SMSL). Permita que el nio se alimente en cada mama todo lo que desee. Cuando el beb se desprende o se queda dormido mientras se est alimentando de la primera mama, ofrzcale la segunda. Debido a que, con frecuencia, los recin nacidos estn somnolientos las primeras semanas de vida, es posible que deba despertar al beb para alimentarlo. Los horarios de lactancia varan de un beb a otro. Sin embargo, las siguientes reglas pueden servir como gua para ayudarla a garantizar que el beb se alimenta adecuadamente:  Se puede amamantar a los recin nacidos (bebs de 4 semanas o menos de vida) cada 1 a 3 horas.  No deben transcurrir ms de 3 horas durante el da o 5 horas durante la noche sin que se amamante a los recin nacidos.  Debe amamantar al beb un mnimo de 8 veces en un perodo de 24 horas. Extraccin de leche materna La extraccin y el almacenamiento de la leche materna le permiten asegurarse de que el beb se alimente exclusivamente de su leche materna, aun en momentos en los que no puede amamantar. Esto tiene especial importancia si debe regresar al trabajo en el perodo en que an est amamantando o si no puede estar presente en los momentos en que el beb debe alimentarse. Su asesor en lactancia puede ayudarla a encontrar un mtodo de extraccin que funcione mejor para usted y orientarla sobre cunto tiempo es seguro almacenar leche materna.      Cmo cuidar las mamas durante la lactancia Los pezones pueden secarse, agrietarse y doler durante la lactancia. Las siguientes recomendaciones pueden ayudarla a mantener las mamas humectadas y  sanas:  Evite usar jabn en los pezones.  Use un sostn de soporte diseado especialmente para la lactancia materna. Evite usar sostenes con aro o sostenes muy ajustados (sostenes deportivos).  Seque al aire sus pezones durante 3 a 4minutos despus de amamantar al beb.  Utilice solo apsitos de algodn en   el sostn para absorber las prdidas de leche. La prdida de un poco de leche materna entre las tomas es normal.  Utilice lanolina sobre los pezones luego de amamantar. La lanolina ayuda a mantener la humedad normal de la piel. La lanolina pura no es perjudicial (no es txica) para el beb. Adems, puede extraer manualmente algunas gotas de leche materna y masajear suavemente esa leche sobre los pezones para que la leche se seque al aire. Durante las primeras semanas despus del nacimiento, algunas mujeres experimentan congestin mamaria. La congestin mamaria puede hacer que sienta las mamas pesadas, calientes y sensibles al tacto. El pico de la congestin mamaria ocurre en el plazo de los 3 a 5 das despus del parto. Las siguientes recomendaciones pueden ayudarla a aliviar la congestin mamaria:  Vace por completo las mamas al amamantar o extraer leche. Puede aplicar calor hmedo en las mamas (en la ducha o con toallas hmedas para manos) antes de amamantar o extraer leche. Esto aumenta la circulacin y ayuda a que la leche fluya. Si el beb no vaca por completo las mamas cuando lo amamanta, extraiga la leche restante despus de que haya finalizado.  Aplique compresas de hielo sobre las mamas inmediatamente despus de amamantar o extraer leche, a menos que le resulte demasiado incmodo. Haga lo siguiente: ? Ponga el hielo en una bolsa plstica. ? Coloque una toalla entre la piel y la bolsa de hielo. ? Coloque el hielo durante 20minutos, 2 o 3veces por da.  Asegrese de que el beb est prendido y se encuentre en la posicin correcta mientras lo alimenta. Si la congestin mamaria  persiste luego de 48 horas o despus de seguir estas recomendaciones, comunquese con su mdico o un asesor en lactancia. Recomendaciones de salud general durante la lactancia  Consuma 3 comidas y 3 colaciones saludables todos los das. Las madres bien alimentadas que amamantan necesitan entre 450 y 500 caloras adicionales por da. Puede cumplir con este requisito al aumentar la cantidad de una dieta equilibrada que realice.  Beba suficiente agua para mantener la orina clara o de color amarillo plido.  Descanse con frecuencia, reljese y siga tomando sus vitaminas prenatales para prevenir la fatiga, el estrs y los niveles bajos de vitaminas y minerales en el cuerpo (deficiencias de nutrientes).  No consuma ningn producto que contenga nicotina o tabaco, como cigarrillos y cigarrillos electrnicos. El beb puede verse afectado por las sustancias qumicas de los cigarrillos que pasan a la leche materna y por la exposicin al humo ambiental del tabaco. Si necesita ayuda para dejar de fumar, consulte al mdico.  Evite el consumo de alcohol.  No consuma drogas ilegales o marihuana.  Antes de usar cualquier medicamento, hable con el mdico. Estos incluyen medicamentos recetados y de venta libre, como tambin vitaminas y suplementos a base de hierbas. Algunos medicamentos, que pueden ser perjudiciales para el beb, pueden pasar a travs de la leche materna.  Puede quedar embarazada durante la lactancia. Si se desea un mtodo anticonceptivo, consulte al mdico sobre cules son las opciones seguras durante la lactancia. Dnde encontrar ms informacin: Liga internacional [place]eche: www.llli.org. Comunquese con un mdico si:  Siente que quiere dejar de amamantar o se siente frustrada con la lactancia.  Sus pezones estn agrietados o sangran.  Sus mamas estn irritadas, sensibles o calientes.  Tiene los siguientes sntomas: ? Dolor en las mamas o en los pezones. ? Un rea hinchada en cualquiera  de las mamas. ? Fiebre o escalofros. ? Nuseas o vmitos. ?   Drenaje de otro lquido distinto de la leche materna desde los pezones.  Sus mamas no se llenan antes de amamantar al beb para el quinto da despus del parto.  Se siente triste y deprimida.  El beb: ? Est demasiado somnoliento como para comer bien. ? Tiene problemas para dormir. ? Tiene ms de 1 semana de vida y moja menos de 6 paales en un periodo de 24 horas. ? No ha aumentado de peso a los 5 das de vida.  El beb defeca menos de 3 veces en 24 horas.  La piel del beb o las partes blancas de los ojos se vuelven amarillentas. Solicite ayuda de inmediato si:  El beb est muy cansado (letargo) y no se quiere despertar para comer.  Le sube la fiebre sin causa. Resumen  La lactancia materna ofrece muchos beneficios de salud para bebs y madres.  Intente amamantar a su beb cuando muestre signos tempranos de hambre.  Haga cosquillas o empuje suavemente los labios del beb con el dedo o el pezn para lograr que el beb abra la boca. Acerque el beb a la mama. Asegrese de que la mayor parte de la arola se encuentre dentro de la boca del beb. Ofrzcale una mama y haga eructar al beb antes de pasar a la otra.  Hable con su mdico o asesor en lactancia si tiene dudas o problemas con la lactancia. Esta informacin no tiene como fin reemplazar el consejo del mdico. Asegrese de hacerle al mdico cualquier pregunta que tenga. Document Revised: 11/23/2017 Document Reviewed: 12/19/2016 Elsevier Patient Education  2021 Elsevier Inc.  

## 2020-12-02 NOTE — Progress Notes (Signed)
Pt has Glucose readings on Paper.

## 2020-12-02 NOTE — Progress Notes (Signed)
Spoke with patient about diabetes, blood glucose log, and diet. Patient reports eating some spaghetti, hamburgers, and other carbs when postprandial sugars are high; otherwise, she adheres to diet well.   Blood glucose log reveals FBG between 66-94 mg/dL, all within range. Postprandial glucose ranges between 73-157 mg/dL with ~15% within range.   She denies signs/symptoms of hyper-/hypoglycemia as well as an adverse reactions to metformin.   Recommend continuing metformin 1000mg  Bid.   Patient also requests more lancets.

## 2020-12-03 ENCOUNTER — Encounter (HOSPITAL_COMMUNITY): Payer: Self-pay | Admitting: Obstetrics and Gynecology

## 2020-12-03 ENCOUNTER — Inpatient Hospital Stay (HOSPITAL_COMMUNITY)
Admission: AD | Admit: 2020-12-03 | Discharge: 2020-12-05 | DRG: 805 | Disposition: A | Discharge status: Home / Self Care | Payer: Medicaid Other | Attending: Obstetrics & Gynecology | Admitting: Obstetrics & Gynecology

## 2020-12-03 DIAGNOSIS — O09529 Supervision of elderly multigravida, unspecified trimester: Secondary | ICD-10-CM

## 2020-12-03 DIAGNOSIS — O24419 Gestational diabetes mellitus in pregnancy, unspecified control: Secondary | ICD-10-CM | POA: Diagnosis present

## 2020-12-03 DIAGNOSIS — O2412 Pre-existing diabetes mellitus, type 2, in childbirth: Secondary | ICD-10-CM | POA: Diagnosis present

## 2020-12-03 DIAGNOSIS — O99345 Other mental disorders complicating the puerperium: Secondary | ICD-10-CM | POA: Diagnosis not present

## 2020-12-03 DIAGNOSIS — Z3490 Encounter for supervision of normal pregnancy, unspecified, unspecified trimester: Secondary | ICD-10-CM

## 2020-12-03 DIAGNOSIS — F419 Anxiety disorder, unspecified: Secondary | ICD-10-CM | POA: Diagnosis present

## 2020-12-03 DIAGNOSIS — O2413 Pre-existing diabetes mellitus, type 2, in the puerperium: Secondary | ICD-10-CM | POA: Diagnosis not present

## 2020-12-03 DIAGNOSIS — E119 Type 2 diabetes mellitus without complications: Secondary | ICD-10-CM | POA: Diagnosis present

## 2020-12-03 DIAGNOSIS — F418 Other specified anxiety disorders: Secondary | ICD-10-CM | POA: Diagnosis not present

## 2020-12-03 DIAGNOSIS — Z7984 Long term (current) use of oral hypoglycemic drugs: Secondary | ICD-10-CM

## 2020-12-03 DIAGNOSIS — F32A Depression, unspecified: Secondary | ICD-10-CM | POA: Diagnosis present

## 2020-12-03 DIAGNOSIS — Z3A36 36 weeks gestation of pregnancy: Secondary | ICD-10-CM

## 2020-12-03 DIAGNOSIS — Z20822 Contact with and (suspected) exposure to covid-19: Secondary | ICD-10-CM | POA: Diagnosis present

## 2020-12-03 DIAGNOSIS — Z23 Encounter for immunization: Secondary | ICD-10-CM

## 2020-12-03 DIAGNOSIS — Z789 Other specified health status: Secondary | ICD-10-CM | POA: Diagnosis present

## 2020-12-03 DIAGNOSIS — O24111 Pre-existing diabetes mellitus, type 2, in pregnancy, first trimester: Secondary | ICD-10-CM | POA: Diagnosis present

## 2020-12-03 DIAGNOSIS — O0990 Supervision of high risk pregnancy, unspecified, unspecified trimester: Secondary | ICD-10-CM

## 2020-12-03 LAB — RESP PANEL BY RT-PCR (FLU A&B, COVID) ARPGX2
Influenza A by PCR: NEGATIVE
Influenza B by PCR: NEGATIVE
SARS Coronavirus 2 by RT PCR: NEGATIVE

## 2020-12-03 LAB — POCT FERN TEST: POCT Fern Test: POSITIVE

## 2020-12-03 LAB — GLUCOSE, CAPILLARY: Glucose-Capillary: 140 mg/dL — ABNORMAL HIGH (ref 70–99)

## 2020-12-03 MED ORDER — PRENATAL MULTIVITAMIN CH
1.0000 | ORAL_TABLET | Freq: Every day | ORAL | Status: DC
  Administered 2020-12-04 – 2020-12-05 (×2): 1 via ORAL
  Filled 2020-12-03 (×2): qty 1

## 2020-12-03 MED ORDER — OXYCODONE-ACETAMINOPHEN 5-325 MG PO TABS: 2.0000 | ORAL_TABLET | ORAL | Status: DC | PRN

## 2020-12-03 MED ORDER — LACTATED RINGERS IV SOLN: 500.0000 mL | INTRAVENOUS | Status: DC | PRN

## 2020-12-03 MED ORDER — SOD CITRATE-CITRIC ACID 500-334 MG/5ML PO SOLN: 30.0000 mL | ORAL | Status: DC | PRN

## 2020-12-03 MED ORDER — LACTATED RINGERS IV SOLN: INTRAVENOUS | Status: DC

## 2020-12-03 MED ORDER — FENTANYL CITRATE (PF) 100 MCG/2ML IJ SOLN: 50.0000 ug | INTRAMUSCULAR | Status: DC | PRN

## 2020-12-03 MED ORDER — ACETAMINOPHEN 325 MG PO TABS
650.0000 mg | ORAL_TABLET | Freq: Four times a day (QID) | ORAL | Status: DC
  Administered 2020-12-03 – 2020-12-05 (×5): 650 mg via ORAL
  Filled 2020-12-03 (×5): qty 2

## 2020-12-03 MED ORDER — OXYTOCIN-SODIUM CHLORIDE 30-0.9 UT/500ML-% IV SOLN: 2.5000 [IU]/h | INTRAVENOUS | Status: DC

## 2020-12-03 MED ORDER — LIDOCAINE HCL (PF) 1 % IJ SOLN: 30.0000 mL | INTRAMUSCULAR | Status: DC | PRN

## 2020-12-03 MED ORDER — LACTATED RINGERS IV SOLN
500.0000 mL | INTRAVENOUS | Status: DC | PRN
Start: 2020-12-03 — End: 2020-12-03

## 2020-12-03 MED ORDER — ONDANSETRON HCL 4 MG/2ML IJ SOLN: 4.0000 mg | INTRAMUSCULAR | Status: DC | PRN

## 2020-12-03 MED ORDER — TETANUS-DIPHTH-ACELL PERTUSSIS 5-2.5-18.5 LF-MCG/0.5 IM SUSY
0.5000 mL | PREFILLED_SYRINGE | Freq: Once | INTRAMUSCULAR | Status: AC
  Administered 2020-12-05: 0.5 mL via INTRAMUSCULAR
  Filled 2020-12-03: qty 0.5

## 2020-12-03 MED ORDER — BENZOCAINE-MENTHOL 20-0.5 % EX AERO
1.0000 | INHALATION_SPRAY | CUTANEOUS | Status: DC | PRN
Start: 2020-12-03 — End: 2020-12-05

## 2020-12-03 MED ORDER — METFORMIN HCL 500 MG PO TABS
1000.0000 mg | ORAL_TABLET | Freq: Two times a day (BID) | ORAL | Status: DC
  Administered 2020-12-04 – 2020-12-05 (×4): 1000 mg via ORAL
  Filled 2020-12-03 (×4): qty 2

## 2020-12-03 MED ORDER — ONDANSETRON HCL 4 MG PO TABS: 4.0000 mg | ORAL_TABLET | ORAL | Status: DC | PRN

## 2020-12-03 MED ORDER — OXYTOCIN 10 UNIT/ML IJ SOLN
10.0000 [IU] | Freq: Once | INTRAMUSCULAR | Status: AC
  Administered 2020-12-03: 10 [IU] via INTRAMUSCULAR

## 2020-12-03 MED ORDER — ONDANSETRON HCL 4 MG/2ML IJ SOLN: 4.0000 mg | Freq: Four times a day (QID) | INTRAMUSCULAR | Status: DC | PRN

## 2020-12-03 MED ORDER — OXYTOCIN 10 UNIT/ML IJ SOLN
INTRAMUSCULAR | Status: AC
  Filled 2020-12-03: qty 1

## 2020-12-03 MED ORDER — COCONUT OIL OIL
1.0000 | TOPICAL_OIL | Status: DC | PRN
Start: 2020-12-03 — End: 2020-12-05

## 2020-12-03 MED ORDER — OXYTOCIN BOLUS FROM INFUSION: 333.0000 mL | Freq: Once | INTRAVENOUS | Status: DC

## 2020-12-03 MED ORDER — DIPHENHYDRAMINE HCL 25 MG PO CAPS: 25.0000 mg | ORAL_CAPSULE | Freq: Four times a day (QID) | ORAL | Status: DC | PRN

## 2020-12-03 MED ORDER — ACETAMINOPHEN 325 MG PO TABS: 650.0000 mg | ORAL_TABLET | ORAL | Status: DC | PRN

## 2020-12-03 MED ORDER — SENNOSIDES-DOCUSATE SODIUM 8.6-50 MG PO TABS
2.0000 | ORAL_TABLET | Freq: Every day | ORAL | Status: DC
  Administered 2020-12-04 – 2020-12-05 (×2): 2 via ORAL
  Filled 2020-12-03 (×2): qty 2

## 2020-12-03 MED ORDER — OXYCODONE-ACETAMINOPHEN 5-325 MG PO TABS: 1.0000 | ORAL_TABLET | ORAL | Status: DC | PRN

## 2020-12-03 MED ORDER — WITCH HAZEL-GLYCERIN EX PADS: 1.0000 "application " | MEDICATED_PAD | CUTANEOUS | Status: DC | PRN

## 2020-12-03 MED ORDER — SIMETHICONE 80 MG PO CHEW: 80.0000 mg | CHEWABLE_TABLET | ORAL | Status: DC | PRN

## 2020-12-03 MED ORDER — IBUPROFEN 600 MG PO TABS
600.0000 mg | ORAL_TABLET | Freq: Four times a day (QID) | ORAL | Status: DC
  Administered 2020-12-03 – 2020-12-05 (×8): 600 mg via ORAL
  Filled 2020-12-03 (×8): qty 1

## 2020-12-03 MED ORDER — DIBUCAINE (PERIANAL) 1 % EX OINT: 1.0000 "application " | TOPICAL_OINTMENT | CUTANEOUS | Status: DC | PRN

## 2020-12-03 NOTE — Discharge Summary (Signed)
Postpartum Discharge Summary     Patient Name: Catherine Morris DOB: [birth year] MRN: [id number]  Date of admission: 12/03/2020 Delivery date:12/03/2020  Delivering provider: Clarnce Flock  Date of discharge: 12/05/2020  Admitting diagnosis: GDM (gestational diabetes mellitus) [O24.419] Pregnancy [Z34.90] Intrauterine pregnancy: [redacted]w[redacted]d     Secondary diagnosis:  Active Problems:   Anxiety and depression   Language barrier   AMA (advanced maternal age) multigravida 35+   Pre-existing type 2 diabetes mellitus during pregnancy in first trimester   GDM (gestational diabetes mellitus)   Pregnancy   Vaginal delivery   Additional problems: as noted above Discharge diagnosis: Term Pregnancy Delivered                                              Post partum procedures: none Augmentation:  none Complications: None  Hospital course: Onset of Labor With Vaginal Delivery      40 y.o. yo [id number] at [redacted]w[redacted]d was admitted in Active Labor on 12/03/2020. Patient had an uncomplicated labor course as follows:  Membrane Rupture Time/Date: 3:30 PM ,12/03/2020   Delivery Method:Vaginal, Spontaneous  Episiotomy: None  Lacerations:  None  Patient had an uncomplicated postpartum course.  She is ambulating, tolerating a regular diet, passing flatus, and urinating well. Patient is discharged home in stable condition on 12/05/20. Pt discharged on metformin given T2DM.  Newborn Data: Birth date:12/03/2020  Birth time:5:23 PM  Gender:Female  Living status:Living  Apgars:8 ,9  Weight:2960 g   Magnesium Sulfate received: No BMZ received: Yes (09/14/20 & 09/15/20) Rhophylac:N/A MMR:N/A T-DaP: offered prior to discharge Flu: Yes Transfusion:No  Physical exam  Vitals:   12/04/20 0800 12/04/20 1400 12/04/20 2200 12/05/20 0624  BP: 103/72 (!) 90/55 100/69 100/70  Pulse: 82 81 76 66  Resp: $Remo'14 16 16 18  'FwIfC$ Temp: 97.8 F (36.6 C) 98.3 F (36.8 C) 98.2 F (36.8 C) 97.9 F (36.6 C)  TempSrc: Oral  Oral Oral Oral  SpO2: 99%   100%   General: alert, cooperative and no distress Lochia: appropriate Uterine Fundus: firm Incision: N/A DVT Evaluation: No evidence of DVT seen on physical exam. No cords or calf tenderness. No significant calf/ankle edema. Labs: Lab Results  Component Value Date   WBC 10.1 12/04/2020   HGB 12.0 12/04/2020   HCT 34.3 (L) 12/04/2020   MCV 90.7 12/04/2020   PLT 251 12/04/2020   CMP Latest Ref Rng & Units 09/14/2020  Glucose 70 - 99 mg/dL 99  BUN 6 - 20 mg/dL 11  Creatinine 0.44 - 1.00 mg/dL 0.89  Sodium 135 - 145 mmol/L 133(L)  Potassium 3.5 - 5.1 mmol/L 3.5  Chloride 98 - 111 mmol/L 102  CO2 22 - 32 mmol/L 21(L)  Calcium 8.9 - 10.3 mg/dL 8.9  Total Protein 6.5 - 8.1 g/dL 6.5  Total Bilirubin 0.3 - 1.2 mg/dL 0.4  Alkaline Phos 38 - 126 U/L 70  AST 15 - 41 U/L 15  ALT 0 - 44 U/L 14   Edinburgh Score: Edinburgh Postnatal Depression Scale Screening Tool 12/04/2020  I have been able to laugh and see the funny side of things. 0  I have looked forward with enjoyment to things. 0  I have blamed myself unnecessarily when things went wrong. 0  I have been anxious or worried for no good reason. 0  I have felt scared or panicky for no good  reason. 0  Things have been getting on top of me. 0  I have been so unhappy that I have had difficulty sleeping. 0  I have felt sad or miserable. 0  I have been so unhappy that I have been crying. 1  The thought of harming myself has occurred to me. 0  Edinburgh Postnatal Depression Scale Total 1     After visit meds:  Allergies as of 12/05/2020   No Known Allergies     Medication List    STOP taking these medications   aspirin EC 81 MG tablet   gabapentin 300 MG capsule Commonly known as: NEURONTIN   oxyCODONE-acetaminophen 5-325 MG tablet Commonly known as: PERCOCET/ROXICET   PRENATAL AD PO   VITAMIN D PO     TAKE these medications   acetaminophen 325 MG tablet Commonly known as: Tylenol Take  2 tablets (650 mg total) by mouth every 6 (six) hours as needed for mild pain, moderate pain, fever or headache.   coconut oil Oil Apply 1 application topically as needed (nipple pain).   ibuprofen 600 MG tablet Commonly known as: ADVIL Take 1 tablet (600 mg total) by mouth every 8 (eight) hours as needed for moderate pain or cramping.   metFORMIN 1000 MG tablet Commonly known as: GLUCOPHAGE Take 1 tablet (1,000 mg total) by mouth 2 (two) times daily with a meal.   prenatal multivitamin Tabs tablet Take 1 tablet by mouth daily at 12 noon.      Discharge home in stable condition Infant Feeding:  both Infant Disposition:home with mother Discharge instruction: per After Visit Summary and Postpartum booklet. Activity: Advance as tolerated. Pelvic rest for 6 weeks.  Diet: routine diet Future Appointments: Future Appointments  Date Time Provider [place]  12/08/2020  8:15 AM Clarnce Flock, MD [facility]  12/15/2020  8:15 AM Clarnce Flock, MD [facility]  12/22/2020  9:35 AM Clarnce Flock, MD [facility]   Follow up Visit: Message sent to [facility] by Dr. Astrid Drafts.  Please schedule this patient for a In person postpartum visit in 6 weeks with the following provider: Any provider. Additional Postpartum F/U:Postpartum Depression checkup 2 weeks High risk pregnancy complicated by:  L7NP (metformin in pregnancy), h/o preterm delivery, anxiety/depression (no meds) Delivery mode:  Vaginal, Spontaneous  Anticipated Birth Control:   plan for Paraguard at HD  12/05/2020 Randa Ngo, MD

## 2020-12-03 NOTE — MAU Note (Signed)
Pt c/o cntx since last night which have gotten worse, feels like SROM around 1530. Endorses spotting and +FM. Was found to be ruptured per [facility] test.  SVE per RN was 9 cm. Pt has T2DM, GBS pending.  Provider accompanied RN to transport pt to ROOM 16.

## 2020-12-03 NOTE — Lactation Note (Signed)
This note was copied from a baby's chart. Lactation Consultation Note  Patient Name: Catherine Morris GHWEX'H Date: 12/03/2020 Reason for consult: L&D Initial assessment;Late-preterm 34-36.6wks;Maternal endocrine disorder Age:38 hours  Visited with mom of 1 hour old LPI female, she's a P3 and reported (+) breast changes during the pregnancy. Mom is also experienced BF, she chooses to do both for this baby, breast and formula feeding. She was diagnosed with DM2 early during the pregnancy, but due to elevated glucose levels later during the pregnancy, she was also classified as GDM.  LC assisted with hand expression, she was able to get colostrum, praised her for her efforts. Baby already nursing when entered the room, but he was slowing down and falling asleep. LC assisted with repositioning for a deeper latch and baby started sucking again, a few audible swallows noted upon breast compressions. Baby still nursing when exiting the room at the 12 minutes mark.  Reviewed normal newborn behavior, cluster feeding, feeding cues, size of baby's stomach, benefits of STS care and newborn's hypoglycemia.   Feeding plan:  1. Encouraged mom to feed baby STS 8-12 times/24 hours or sooner if feeding cues are present 2. Hand expression and spoon feeding were also strongly encouraged  FOB present but he didn't speak any Spanish, which was mom's preferred language, FOB only speaks [place], LC spoke to him in [place] and to mom in [place]. Parents reported all questions and concerns were answered, they're both aware of LC OP services and will call PRN.   Maternal Data Has patient been taught Hand Expression?: Yes Does the patient have breastfeeding experience prior to this delivery?: Yes How long did the patient breastfeed?: 12 months  Feeding Mother's Current Feeding Choice: Breast Milk  LATCH Score Latch: Repeated attempts needed to sustain latch, nipple held in mouth throughout feeding,  stimulation needed to elicit sucking reflex. (baby started to slow down and falling asleep)  Audible Swallowing: A few with stimulation  Type of Nipple: Everted at rest and after stimulation  Comfort (Breast/Nipple): Soft / non-tender  Hold (Positioning): Assistance needed to correctly position infant at breast and maintain latch.  LATCH Score: 7   Lactation Tools Discussed/Used    Interventions Interventions: Breast feeding basics reviewed;Assisted with latch;Skin to skin;Breast massage;Hand express;Breast compression;Adjust position;Support pillows  Discharge WIC Program: Yes  Consult Status Consult Status: Follow-up Date: 12/04/20 Follow-up type: In-patient    Milagros Venetia Constable 12/03/2020, 6:23 PM

## 2020-12-03 NOTE — H&P (Signed)
OBSTETRIC ADMISSION HISTORY AND PHYSICAL  Catherine Morris is a 40 y.o. female [id number] with IUP at [redacted]w[redacted]d by L/6 presenting for spontaneous onset of labor. She reports +FMs, No LOF, no VB, no blurry vision, headaches or peripheral edema, and RUQ pain.  She plans on breast & formula feeding. She requests outpatient Paraguard IUD for birth control. She received her prenatal care at [facility]   Dating: By L/6 --->  Estimated Date of Delivery: 12/27/20  Sono:  @[redacted]w[redacted]d , CWD, normal anatomy, cephalic presentation, 2685g, 02-19-1975 EFW  Prenatal History/Complications:  - h/o preterm delivery at 35 weeks - pre-existing T2DM  (metformin in pregnancy, normal fetal echo at [facility]) - Anxiety/Depression (no medications) - Advanced Maternal Age - Language barrier  Past Medical History: Past Medical History:  Diagnosis Date  . Back pain   . Bipolar disorder (HCC)   . Depression   . Diabetes mellitus without complication (HCC)   . Gestational diabetes   . Knee pain     Past Surgical History: Past Surgical History:  Procedure Laterality Date  . NO PAST SURGERIES      Obstetrical History: OB History    Gravida  3   Para  2   Term  1   Preterm  1   AB      Living  2     SAB      IAB      Ectopic      Multiple      Live Births  2           Social History Social History   Socioeconomic History  . Marital status: Single    Spouse name: Not on file  . Number of children: Not on file  . Years of education: Not on file  . Highest education level: Not on file  Occupational History  . Not on file  Tobacco Use  . Smoking status: Never Smoker  . Smokeless tobacco: Never Used  Vaping Use  . Vaping Use: Never used  Substance and Sexual Activity  . Alcohol use: No  . Drug use: No  . Sexual activity: Yes    Partners: Male  Other Topics Concern  . Not on file  Social History Narrative  . Not on file   Social Determinants of Health   Financial Resource Strain: Not on  file  Food Insecurity: No Food Insecurity  . Worried About [facility] in the Last Year: Never true  . Ran Out of Food in the Last Year: Never true  Transportation Needs: No Transportation Needs  . Lack of Transportation (Medical): No  . Lack of Transportation (Non-Medical): No  Physical Activity: Not on file  Stress: Not on file  Social Connections: Not on file    Family History: Family History  Problem Relation Age of Onset  . Hypertension Mother   . Diabetes Father   . Diabetes Brother     Allergies: No Known Allergies  Medications Prior to Admission  Medication Sig Dispense Refill Last Dose  . aspirin EC 81 MG tablet Take 1 tablet (81 mg total) by mouth daily. 30 tablet 10 12/02/2020 at Unknown time  . metFORMIN (GLUCOPHAGE) 1000 MG tablet Take 1 tablet (1,000 mg total) by mouth 2 (two) times daily with a meal. 60 tablet 3 12/03/2020 at 1200  . Prenatal Vit-DSS-Fe Cbn-FA (PRENATAL AD PO) Take by mouth.   12/03/2020 at Unknown time  . VITAMIN D PO Take by mouth daily.   12/03/2020 at  Unknown time  . gabapentin (NEURONTIN) 300 MG capsule Take 1 capsule (300 mg total) by mouth 3 (three) times daily as needed for up to 14 days (pain). 42 capsule 1   . oxyCODONE-acetaminophen (PERCOCET/ROXICET) 5-325 MG tablet Take 2 tablets by mouth every 6 (six) hours as needed for severe pain. (Patient not taking: No sig reported) 30 tablet 0 Unknown at Unknown time     Review of Systems   All systems reviewed and negative except as stated in HPI  Last menstrual period 03/15/2020. General appearance: alert, cooperative and appears stated age Lungs: normal WOB Heart: regular rate Abdomen: soft, non-tender Extremities: no sign of DVT Presentation: cephalic Fetal monitoringBaseline: 125 bpm, Variability: Good {> 6 bpm), Accelerations: Reactive and Decelerations: Absent Uterine activityFrequency: Every 2-3 minutes Dilation: 10 Effacement (%): 100 Station: 0 Exam by:: [place]  MD   Prenatal labs: ABO, Rh: --/--/O POS (01/03 2100) Antibody: NEG (01/03 2100) Rubella: 5.36 (10/08 1002) RPR: Non Reactive (01/26 1204)  HBsAg: Negative (10/08 1002)  HIV: Non Reactive (01/26 1204)  GBS:   unknown 1 hr Glucola not performed given known T2DM Genetic screening wnl Anatomy US wnl  Prenatal Transfer Tool  Maternal Diabetes: Yes:  Diabetes Type:  Insulin/Medication controlled Genetic Screening: Normal Maternal Ultrasounds/Referrals: Normal Fetal Ultrasounds or other Referrals:  Fetal echo, Referred to Materal Fetal Medicine  Maternal Substance Abuse:  No Significant Maternal Medications:  Meds include: Other:  metformin, ASA Significant Maternal Lab Results: Other: GBS unknown  Results for orders placed or performed during the hospital encounter of 12/03/20 (from the past 24 hour(s))  [facility] Time: 12/03/20  4:16 PM  Result Value Ref Range   POCT Fern Test Positive = ruptured amniotic membanes     Patient Active Problem List   Diagnosis Date Noted  . GDM (gestational diabetes mellitus) 12/03/2020  . Pregnancy 12/03/2020  . History of preterm delivery 11/18/2020  . Vaginal bleeding in pregnancy 09/14/2020  . Supervision of high risk pregnancy, antepartum 06/19/2020  . Language barrier 06/19/2020  . AMA (advanced maternal age) multigravida 35+ 06/19/2020  . History of migraine 06/19/2020  . Gastroesophageal reflux disease 06/19/2020  . Pre-existing type 2 diabetes mellitus during pregnancy in first trimester 06/19/2020  . Vitamin D deficiency 05/23/2014  . Intractable chronic migraine without aura and without status migrainosus 03/31/2014  . Anxiety and depression 02/20/2014  . Chronic headache 02/20/2014  . Bilateral knee pain 02/20/2014    Assessment/Plan:  Catherine Morris is a 40 y.o. [id number] at [redacted]w[redacted]d here for management of spontaneous labor. Pt found to have complete cervical dilation s/p arrival to L&D.  #Labor: Will  manage expectantly. #Pain: None given precipitous nature of delivery #FWB: Category 1 strip on admission #ID: GBS unknown given preterm; unable to dose antibiotics intrapartum given precipitous nature of delivery as noted above. #MOF: breast & formula #MOC: Plan for [facility]ard [facility] #Circ: desired #T2DM: metformin in pregnancy. Will plan to monitor BG levels in postpartum period and discharge on metformin. #Anxiety  Depression: will plan for SW consult in postpartum period and 2 week mood check in clinic.  Sheila Oats, MD OB Fellow, Faculty Practice 12/03/2020 4:47 PM

## 2020-12-04 DIAGNOSIS — O99345 Other mental disorders complicating the puerperium: Secondary | ICD-10-CM

## 2020-12-04 DIAGNOSIS — E119 Type 2 diabetes mellitus without complications: Secondary | ICD-10-CM

## 2020-12-04 DIAGNOSIS — F418 Other specified anxiety disorders: Secondary | ICD-10-CM

## 2020-12-04 DIAGNOSIS — O2413 Pre-existing diabetes mellitus, type 2, in the puerperium: Secondary | ICD-10-CM

## 2020-12-04 DIAGNOSIS — Z7984 Long term (current) use of oral hypoglycemic drugs: Secondary | ICD-10-CM

## 2020-12-04 LAB — GC/CHLAMYDIA PROBE AMP (~~LOC~~) NOT AT ARMC
Chlamydia: NEGATIVE
Comment: NEGATIVE
Comment: NORMAL
Neisseria Gonorrhea: NEGATIVE

## 2020-12-04 LAB — CBC
HCT: 34.3 % — ABNORMAL LOW (ref 36.0–46.0)
Hemoglobin: 12 g/dL (ref 12.0–15.0)
MCH: 31.7 pg (ref 26.0–34.0)
MCHC: 35 g/dL (ref 30.0–36.0)
MCV: 90.7 fL (ref 80.0–100.0)
Platelets: 251 10*3/uL (ref 150–400)
RBC: 3.78 MIL/uL — ABNORMAL LOW (ref 3.87–5.11)
RDW: 12.3 % (ref 11.5–15.5)
WBC: 10.1 10*3/uL (ref 4.0–10.5)
nRBC: 0 % (ref 0.0–0.2)

## 2020-12-04 LAB — RPR: RPR Ser Ql: NONREACTIVE

## 2020-12-04 LAB — GLUCOSE, CAPILLARY: Glucose-Capillary: 77 mg/dL (ref 70–99)

## 2020-12-04 NOTE — Progress Notes (Signed)
CSW received consult for hx of Anxiety and Depression. CSW met with MOB to offer support and complete assessment.    CSW met with MOB at bedside. CSW used interpreter [phone]. CSW introduced role and congratulated MOB .CSW explain the reason for the visit, to offer support. CSW observed infant sleeping in the bassinet next to MOB who was initially resting. MOB receptive to [place] visit. CSW asked MOB how she feels since giving birth. MOB reports, " I am good." CSW asked MOB how she felt during her pregnancy. MOB reports feeling "fine" during the pregnancy. CSW asked MOB if she is having any thoughts of suicide or homicide. MOB denies thoughts of suicide and homicide. CSW asked MOB about her mental health history with depression and anxiety. MOB reports she was diagnosed with depression and anxiety about eleven years ago. MOB reports, it was because she was not in a good relationship. MOB reports she started seeing the Psychiatrist Dr. Toy Care at [facility] in [place] about a year and half ago. MOB reports she was prescribed xanax but stopped taking the medication after finding out she was pregnant. MOB reports since she stopped taking the medications she has felt fine. She has not decided if she will resume the medication. CSW asked MOB if she is currenlty experiencing domestic violence. MOB denies experiencing domestic violence.  CSW provided education regarding the baby blues period vs. perinatal mood disorders, and discussed treatment. MOB reports she will reach out to Dr. Toy Care if she has any concerns. CSW also gave MOB additional mental health resources. CSW recommended MOB complete a  self-evaluation during the postpartum time period using the New Mom Checklist from Postpartum Progress (in [place]) and encouraged MOB to contact a medical professional if symptoms are noted at any time. MOB receptive and reports understanding. CSW asked MOB about her supports. MOB reports her partner/FOB Larey Days and her  16 year old daughter Levada Dy. CSW provided review of Sudden Infant Death Syndrome (SIDS) precautions and no-co sleeping with the infant. MOB reports understanding. CSW asked MOB if she has items for the infant. MOB reports having items including a bassinet and a car seat. MOB reports she receives [facility] services as well. MOB has chosen a pediatrician at [facility]. CSW assessed the MOB for additional needs.    CSW identifies no further need for intervention and no barriers to discharge at this time.  Kathrin Greathouse, MSW, LCSW Women's and [place] Worker  [phone] 12/04/2020  10:38 AM

## 2020-12-04 NOTE — Progress Notes (Signed)
Post Partum Day 1 Subjective: Patient is doing well without complaints. Ambulating without difficulty. Voiding and passing flatus. Tolerating PO. Abdominal pain improved. Vaginal bleeding decreased.  Objective: Blood pressure 103/72, pulse 82, temperature 97.8 F (36.6 C), temperature source Oral, resp. rate 14, last menstrual period 03/15/2020, SpO2 99 %, unknown if currently breastfeeding.  Physical Exam:  General: alert, cooperative and no distress Lochia: appropriate Uterine Fundus: firm Incision: n/a DVT Evaluation: No evidence of DVT seen on physical exam.  Recent Labs    12/04/20 0547  HGB 12.0  HCT 34.3*    Assessment/Plan: PPD#1  -meeting pp milestones  -VSS  -breastfeeding, going well  -[place]ard @HD   -desires circ for baby-consented  #Anxiety/depression: SW consulted  #T2DM: Metformin 1000 mg BID   LOS: 1 day   12/04/2020, 8:34 AM

## 2020-12-04 NOTE — Lactation Note (Addendum)
This note was copied from a baby's chart. Lactation Consultation Note  Patient Name: Catherine Morris [id number]'U Date: 12/04/2020 Reason for consult: Follow-up assessment;Late-preterm 34-36.6wks Age:40 years  Mom declined interpreter services.  P3, LPTI with -3% weight loss. Per mom, infant is latching well, most feedings are 10 to 15 minutes in length, Per mom, infant is more sleepy today, LC discussed this is normal behavior in LPTI 2nd day of life, mom understands she may have to wake baby to feed if he is not showing feeding cues and not to feed baby longer than 30 minutes.  LC gave parents green sheet " Caring For Your Late Preterm Infant".  LC did not observe latch due infant BF for 10 minutes 2 hours ago and supplemented with 8 mls of formula, infant asleep in dad's arms. Per mom, she not use DEBP and doesn't understand how to use it, LC reviewed how to use DEBP, mom was still pumping when LC left the room and already expressed 10 mls of EBM. Mom understands EBM is good at room temperature up to 4 hours. Mom shown how to use DEBP & how to disassemble, clean, & reassemble parts. Mom's Plan: 1- Latch infant at breast according to feeding cues, 8 to 12 or more times within 24 hours and not exceed 30 minutes feeding infant. 2-Afterwards latching infant at the breast, mom will  supplement infant with her pumped EBM first/ before offering formula, according LPTI feeding policy Day 2 mom will give infant 10 to 20 mls per feeding. 3- Mom will use DEBP every 3 hours for 15 minutes on initial setting. 4- Mom knows to call RN or LC if she has questions, concerns or needs  assistance with latching infant at the breast. Maternal Data    Feeding Mother's Current Feeding Choice: Breast Milk and Formula Nipple Type: Extra Slow Flow  LATCH Score   Lactation Tools Discussed/Used Flange Size: 27 Breast pump type: Double-Electric Breast Pump Pump Education: Setup, frequency, and  cleaning;Milk Storage Reason for Pumping: Help establish mom's milk supply and infant is LPTI Pumping frequency: Mom not started using DEBP, LC reviewed how to use DEBP, mom will pump every 3 hours for 15 minutes. Pumped volume: 10 mL (Mom was still expressing breastmilk from pumping when LC left the room.)  Interventions    Discharge    Consult Status Consult Status: Follow-up Date: 12/05/20 Follow-up type: In-patient    Danelle Earthly 12/04/2020, 4:31 PM

## 2020-12-04 NOTE — Lactation Note (Signed)
This note was copied from a baby's chart. Lactation Consultation Note Baby 11 hrs old. Everyone sleeping in room. Mom woke up when LC looked in rm. Mom told LC to come in. Mom stated OK to see her. LPI information given to mom in SPanish and reviewed. Mom stated she is BF and formula feeding. Mom denied need for interpreter.  Discussed w/mom the need for pumping.  Mom shown how to use DEBP & how to assemble and turn on pump. Encouraged mom to call for assistance using pump if she doesn't remember since in the night. Mom knows to pump q3h for 15-20 min.  Mom's nipples look as if they need #27 flange. Mom encouraged to feed baby 8-12 times/24 hours and with feeding cues.   Encouraged mom to call for feeding assistance if needed.  Mom BF her other children.  Lactation brochure given in SPanish.  Patient Name: Catherine Morris [id number] Date: 12/04/2020 Reason for consult: Initial assessment;Other (Comment) Age:38 hours  Maternal Data    Feeding    LATCH Score Latch: Repeated attempts needed to sustain latch, nipple held in mouth throughout feeding, stimulation needed to elicit sucking reflex.  Audible Swallowing: A few with stimulation  Type of Nipple: Everted at rest and after stimulation  Comfort (Breast/Nipple): Soft / non-tender  Hold (Positioning): Assistance needed to correctly position infant at breast and maintain latch.  LATCH Score: 7   Lactation Tools Discussed/Used Tools: Pump Breast pump type: Double-Electric Breast Pump Pump Education: Setup, frequency, and cleaning;Milk Storage Reason for Pumping: LPI Pumping frequency: Q3  Interventions Interventions: DEBP  Discharge WIC Program: Yes  Consult Status Consult Status: Follow-up Date: 12/04/20 Follow-up type: In-patient    Charyl Dancer 12/04/2020, 4:28 AM

## 2020-12-05 MED ORDER — PRENATAL MULTIVITAMIN CH: 1.0000 | ORAL_TABLET | Freq: Every day | ORAL | Status: AC

## 2020-12-05 MED ORDER — ACETAMINOPHEN 325 MG PO TABS: 650.0000 mg | ORAL_TABLET | Freq: Four times a day (QID) | ORAL | Status: DC | PRN

## 2020-12-05 MED ORDER — COCONUT OIL OIL: 1.0000 "application " | TOPICAL_OIL | 0 refills | Status: DC | PRN

## 2020-12-05 MED ORDER — METFORMIN HCL 1000 MG PO TABS: 1000.0000 mg | ORAL_TABLET | Freq: Two times a day (BID) | ORAL | 2 refills | Status: AC

## 2020-12-05 MED ORDER — IBUPROFEN 600 MG PO TABS: 600.0000 mg | ORAL_TABLET | Freq: Three times a day (TID) | ORAL | 0 refills | Status: DC | PRN

## 2020-12-05 NOTE — Lactation Note (Signed)
This note was copied from a baby's chart. Lactation Consultation Note  Patient Name: Catherine Morris MEQAS'T Date: 12/05/2020 Reason for consult: Follow-up assessment;Late-preterm 34-36.6wks;Infant weight loss;Other (Comment) (post circ) Age:38 hours  2nd LC visit today for feeding assessment per MD request.and re-weight check later to determine D/C status.  As LC entered the room , baby just finished feeding 10 mins per mom.  Baby still rooting and LC recommended relatching . LC reviewed the cross cradle and working on depth. Increased swallows with compressions and per mom comfortable. Baby fed 18 mins , and supplemented with 8 ml of EBM from a very slow flow nipple and tolerated well. Latch score 8  1st LC visit D/C teaching reviewed.   Baby needs a re- weight this after noon per MD and per [facility] .   Maternal Data Has patient been taught Hand Expression?: Yes  Feeding Mother's Current Feeding Choice: Breast Milk and Formula Nipple Type: Extra Slow Flow  LATCH Score Latch: Grasps breast easily, tongue down, lips flanged, rhythmical sucking.  Audible Swallowing: Spontaneous and intermittent  Type of Nipple: Everted at rest and after stimulation  Comfort (Breast/Nipple): Filling, red/small blisters or bruises, mild/mod discomfort  Hold (Positioning): Assistance needed to correctly position infant at breast and maintain latch.  LATCH Score: 8   Lactation Tools Discussed/Used Tools: Pump Flange Size: 24;27 Breast pump type: Double-Electric Breast Pump Pump Education: Milk Storage Pumped volume: 10 mL  Interventions Interventions: Breast feeding basics reviewed;Assisted with latch;Skin to skin;Breast massage;Hand express;Breast compression;Adjust position;Support pillows;Position options;DEBP;Education  Discharge Discharge Education: Warning signs for feeding baby;Engorgement and breast care Pump: DEBP;Manual (per dad plans to buy a pump. LC recommended a  DEBP)  Consult Status Consult Status: Follow-up Date: 12/06/20 Follow-up type: In-patient    Catherine Morris 12/05/2020, 1:45 PM

## 2020-12-05 NOTE — Discharge Instructions (Signed)

## 2020-12-05 NOTE — Lactation Note (Signed)
This note was copied from a baby's chart. Lactation Consultation Note  Patient Name: Catherine Morris [id number]'O Date: 12/05/2020 Reason for consult: Follow-up assessment;Other (Comment);Infant weight loss (dad at bedside, Spanish interpreter - [employer]. baby having circ . per MBURN Catherine Morris the baby probably will go home later today pending on feedings.) Age:40 years/ LPT / P3 experienced BF.  LC reviewed potential D/C teaching for a LPT , now less than 6 pound infant.  LC recommended to keep the weight loss at bay. Breast feeding 1st 15 - 20 mins / 30 mins max and then supplement afterwards with EBM 1st / formula 2nd increasing volume to 30 ml.  After baby is settled - post pump both breast for 10 -15 mins , save milk for the next feeding.    Maternal Data    Feeding Mother's Current Feeding Choice: Breast Milk and Formula  LATCH Score    Lactation Tools Discussed/Used Tools: Pump Breast pump type: Double-Electric Breast Pump Pump Education: Milk Storage Pumped volume: 10 mL  Interventions Interventions: Breast feeding basics reviewed  Discharge Discharge Education: Engorgement and breast care Pump: DEBP;Manual (per dad plans to buy a pump. LC recommended a DEBP) LC recommended to give the baby 1 -2 weeks to increase weight and then schedule and LC O/P appt . Mom has the resource brochure with phone numbers.  Consult Status Consult Status: Follow-up Date: 12/05/20 Follow-up type: In-patient    Catherine Morris Catherine Morris 12/05/2020, 11:41 AM

## 2020-12-06 LAB — CULTURE, BETA STREP (GROUP B ONLY): Strep Gp B Culture: NEGATIVE

## 2020-12-08 ENCOUNTER — Ambulatory Visit: Payer: Self-pay

## 2020-12-08 ENCOUNTER — Encounter: Payer: Self-pay | Admitting: Family Medicine

## 2020-12-09 NOTE — BH Specialist Note (Signed)
Integrated Behavioral Health via Telemedicine Visit  12/09/2020 Catherine Morris [id number]  Number of Integrated Behavioral Health visits: 3 Session Start time: 10:18  Session End time: 10:31 Total time: 13  Referring Provider: Myna Hidalgo, MD Patient/Family location: Home [facility] Provider location: Center for [facility]  All persons participating in visit: Patient Catherine Morris and [facility] Hulda Marin, and Spanish Interpreter [place], [id number]  Types of Service: Individual psychotherapy and Video visit  I connected with Catherine Morris and/or Catherine Morris's n/a via  Telephone or Video Enabled Telemedicine Application  (Video is Caregility application) and verified that I am speaking with the correct person using two identifiers. Discussed confidentiality: Yes   I discussed the limitations of telemedicine and the availability of in person appointments.  Discussed there is a possibility of technology failure and discussed alternative modes of communication if that failure occurs.  I discussed that engaging in this telemedicine visit, they consent to the provision of behavioral healthcare and the services will be billed under their insurance.  Patient and/or legal guardian expressed understanding and consented to Telemedicine visit: Yes   Presenting Concerns: Patient and/or family reports the following symptoms/concerns: Pt states her primary symptom today is fatigue; is sleeping when baby sleeps, good appetite, family is supportive; no other questions or concerns Duration of problem: Postpartum; Severity of problem: mild  Patient and/or Family's Strengths/Protective Factors: Social connections, Concrete supports in place (healthy food, safe environments, etc.), Sense of purpose and Physical Health (exercise, healthy diet, medication compliance, etc.)  Goals Addressed: Patient will: 1.  Demonstrate  ability to: Increase healthy adjustment to current life circumstances  Progress towards Goals: Achieved  Interventions: Interventions utilized:  Supportive Counseling Standardized Assessments completed: GAD-7 and PHQ 9  Patient and/or Family Response: Pt agrees to treatment plan  Assessment: Patient currently experiencing At risk for depression.   Patient may benefit from supportive counseling today.  Plan: 1. Follow up with behavioral health clinician on : Call Robel Wuertz at [phone] as needed 2. Behavioral recommendations:  -Continue sleeping when baby sleeps, eating healthy meals and accepting offers of support from family as needed 3. Referral(s): Integrated [employer] (In Clinic)  I discussed the assessment and treatment plan with the patient and/or parent/guardian. They were provided an opportunity to ask questions and all were answered. They agreed with the plan and demonstrated an understanding of the instructions.   They were advised to call back or seek an in-person evaluation if the symptoms worsen or if the condition fails to improve as anticipated.  Rae Lips, LCSW   Depression screen [facility] 2/9 12/22/2020 12/02/2020 11/04/2020 10/07/2020 09/23/2020  Decreased Interest 0 1 0 0 0  Down, Depressed, Hopeless 0 1 0 0 0  PHQ - 2 Score 0 2 0 0 0  Altered sleeping 0 0 1 2 0  Tired, decreased energy 3 1 0 0 0  Change in appetite 0 0 0 0 0  Feeling bad or failure about yourself  0 0 0 0 0  Trouble concentrating 0 0 0 0 0  Moving slowly or fidgety/restless 0 0 0 0 0  Suicidal thoughts 0 0 0 0 0  PHQ-9 Score 3 3 1 2  0  Difficult doing work/chores - - - - -  Some recent data might be hidden   GAD 7 : Generalized Anxiety Score 12/22/2020 12/02/2020 10/07/2020 09/23/2020  Nervous, Anxious, on Edge 0 0 0 0  Control/stop worrying 0 0 0 0  Worry too  much - different things 0 0 0 0  Trouble relaxing 0 0 - 0  Restless 0 0 0 0  Easily annoyed or irritable 0 0 0 0   Afraid - awful might happen 0 0 0 0  Total GAD 7 Score 0 0 - 0

## 2020-12-14 ENCOUNTER — Encounter: Payer: Self-pay | Admitting: *Deleted

## 2020-12-15 ENCOUNTER — Other Ambulatory Visit: Payer: Self-pay

## 2020-12-15 ENCOUNTER — Encounter: Payer: Self-pay | Admitting: Family Medicine

## 2020-12-15 ENCOUNTER — Ambulatory Visit: Payer: Self-pay

## 2020-12-21 ENCOUNTER — Encounter (HOSPITAL_COMMUNITY): Payer: Self-pay

## 2020-12-21 ENCOUNTER — Inpatient Hospital Stay (HOSPITAL_COMMUNITY): Payer: Self-pay

## 2020-12-21 ENCOUNTER — Inpatient Hospital Stay (HOSPITAL_COMMUNITY): Admission: AD | Admit: 2020-12-21 | Payer: Self-pay | Source: Home / Self Care | Admitting: Obstetrics & Gynecology

## 2020-12-22 ENCOUNTER — Encounter: Payer: Self-pay | Admitting: Family Medicine

## 2020-12-22 ENCOUNTER — Ambulatory Visit: Payer: Self-pay | Admitting: Clinical

## 2020-12-22 DIAGNOSIS — Z9189 Other specified personal risk factors, not elsewhere classified: Secondary | ICD-10-CM

## 2021-01-19 ENCOUNTER — Ambulatory Visit (INDEPENDENT_AMBULATORY_CARE_PROVIDER_SITE_OTHER): Payer: Self-pay | Admitting: Medical

## 2021-01-19 ENCOUNTER — Encounter: Payer: Self-pay | Admitting: Medical

## 2021-01-19 ENCOUNTER — Other Ambulatory Visit: Payer: Self-pay

## 2021-01-19 DIAGNOSIS — R202 Paresthesia of skin: Secondary | ICD-10-CM

## 2021-01-19 DIAGNOSIS — E119 Type 2 diabetes mellitus without complications: Secondary | ICD-10-CM

## 2021-01-19 DIAGNOSIS — R200 Anesthesia of skin: Secondary | ICD-10-CM

## 2021-01-19 NOTE — Patient Instructions (Signed)
Citica Sciatica  La citica es el dolor, debilidad, hormigueo o prdida de la sensibilidad (adormecimiento) a lo largo del nervio citico. El nervio citico comienza en la parte inferior de la espalda y desciende por la parte posterior de cada pierna. Suele desaparecer por s sola o con tratamiento. A veces, la citica puede volver a aparecer (ser recurrente). Cules son las causas? Esta afeccin se produce cuando el nervio citico se comprime o se ejerce presin sobre l. Esto puede ser el resultado de:  Un disco que sobresale demasiado entre los huesos de la columna vertebral (hernia de disco).  Los cambios que se producen [employer] discos vertebrales al Secretary/administrator.  Una afeccin en un msculo de las nalgas.  Un crecimiento seo adicional cerca del nervio citico.  Una rotura (fractura) de la zona que est entre los huesos de la cadera (pelvis).  Embarazo.  Tumor. Esto es poco frecuente. Qu incrementa el riesgo? Es ms probable que tengan esta afeccin las personas que:  Software engineer deportes que ponen presin o tensin sobre la columna vertebral.  Tienen poca fuerza y facilidad de movimiento (flexibilidad).  Han tenido una lesin en la espalda en el pasado.  Han tenido una ciruga en la espalda.  Permanecen sentadas durante largos perodos.  Realizan actividades que implican agacharse o levantar objetos una y [place].  Tienen mucho sobrepeso (es obeso). Cules son los signos o los sntomas? Los sntomas pueden variar de leves a muy graves. Pueden incluir los siguientes:  Cualquiera de los siguientes problemas en la parte inferior de la espalda, piernas, cadera o nalgas: ? Hormigueo leve, prdida de la sensibilidad o dolor sordo. ? Sensacin de ardor. ? Dolor agudo.  Prdida de la sensibilidad en la parte posterior de la pantorrilla o la planta del pie.  Debilidad en las piernas.  Dolor muy intenso en la espalda que dificulta el movimiento. Estos sntomas pueden  empeorar al toser, Engineering geologist o rer. Tambin pueden empeorar al sentarse o estar de pie durante largos perodos. Cmo se trata? A menudo, esta afeccin mejora sin tratamiento. Sin embargo, el tratamiento puede incluir:  Multimedia programmer de Alexander fsica o reducirla cuando siente dolor.  Hacer ejercicios y estiramientos.  Aplicar hielo o calor sobre la zona afectada.  Medicamentos para lo siguiente: ? Aliviar el dolor y la inflamacin. ? Relajar los msculos.  Inyecciones de medicamentos que ayudan a Engineer, materials, la irritacin y la hinchazn.  Ciruga. Siga estas instrucciones en su casa: Medicamentos  [employer] de venta libre y los recetados solamente como se lo haya indicado el mdico.  Consulte a su mdico si el medicamento que le recetaron: ? Hace que sea necesario que evite conducir o usar maquinaria pesada. ? Puede causarle dificultad para defecar (estreimiento). Es posible que deba tomar estas medidas para prevenir o tratar los problemas para defecar:  Product manager suficiente lquido para Radio producer pis (la orina) de color amarillo plido.  Tomar medicamentos recetados o de [employer].  Comer alimentos ricos en fibra. Entre ellos, frijoles, cereales integrales y frutas y verduras frescas.  Limitar los alimentos con alto contenido de grasa y International aid/development worker. Estos incluyen alimentos fritos o dulces. Control del dolor  Si se lo indican, aplique hielo en la zona afectada. ? Ponga el hielo en una bolsa plstica. ? Coloque una [employer] piel y Copy. ? Coloque el hielo durante , 2 a 3veces por da.  Si se lo indican, aplique calor en la zona afectada. Use la fuente de  calor que el mdico le indique, por ejemplo, una compresa de calor hmedo o una almohadilla trmica. ? Coloque una [employer] piel y la fuente de Airline pilot. ? Aplique calor durante 20 a . ? Retire la fuente de calor si la piel se pone de color rojo brillante. Esto es muy  importante si no puede Financial risk analyst, calor o fro. Puede correr un riesgo mayor de sufrir quemaduras.      Actividad  Retome sus actividades habituales como se lo haya indicado el mdico. Pregntele al mdico qu actividades son seguras para usted.  Evite las [employer] sntomas.  Descanse por breves perodos Administrator. ? Cuando descanse durante perodos ms largos, haga alguna actividad fsica o un estiramiento entre los perodos de descanso. ? Evite estar sentado durante largos perodos sin moverse. [place] y [place] al menos una vez cada hora.  Haga ejercicios y estrese con regularidad, como se lo indic el mdico.  No levante nada que pese ms de 10libras (4.5kg) mientras tenga sntomas de citica. ? Aunque no tenga sntomas, evite levantar objetos pesados. ? Evite levantar objetos pesados de forma repetida.  Al levantar objetos, hgalo siempre de una forma que sea segura para su cuerpo. Para esto, debe hacer lo siguiente: ? Flexione las rodillas. ? Mantenga el objeto cerca del cuerpo. ? No gire el cuerpo.   Instrucciones generales  Mantenga un peso saludable.  Use calzado cmodo, que le d soporte al pie. Evite usar tacones.  Evite dormir sobre un colchn que sea demasiado blando o demasiado duro. Es posible que sienta menos dolor si duerme en un colchn con apoyo suficientemente firme para la espalda.  Concurra a todas las visitas de [address] se lo haya indicado el mdico. Esto es importante. Comunquese con un mdico si:  Tiene un dolor con estas caractersticas: ? Lo despierta cuando est dormido. ? Empeora al estar recostado. ? Es Government social research officer que tena en el pasado. ? Dura ms de 4semanas.  Pierde peso sin proponrselo. Solicite ayuda inmediatamente si:  No puede controlar la orina (miccin) ni la evacuacin de la materia fecal (defecacin).  Tiene debilidad en alguna de estas zonas, y la debilidad empeora: ? La parte  inferior de la espalda. ? La zona que se encuentra entre los [employer] caderas. ? Las nalgas. ? Las piernas.  Siente irritacin o inflamacin en la espalda.  Tiene sensacin de ardor al [employer]. Resumen  La citica es el dolor, debilidad, hormigueo o prdida de la sensibilidad (adormecimiento) a lo largo del nervio citico.  Esta afeccin se produce cuando el nervio citico se comprime o se ejerce presin sobre l.  La citica puede Programmer, multimedia, hormigueo o prdida de la sensibilidad (adormecimiento) en la parte inferior de la espalda, las piernas, las caderas y las nalgas.  El tratamiento a menudo incluye reposo, ejercicio, medicamentos y Contractor hielo o calor en la zona afectada. Esta informacin no tiene Theme park manager el consejo del mdico. Asegrese de hacerle al mdico cualquier pregunta que tenga. Document Revised: 10/24/2018 Document Reviewed: 10/24/2018 Elsevier Patient Education  2021 [employer].

## 2021-01-19 NOTE — Progress Notes (Signed)
Post Partum Visit Note  Catherine Morris is a 40 y.o. [phone] female who presents for a postpartum visit. She is 6 weeks postpartum following a normal spontaneous vaginal delivery.  I have fully reviewed the prenatal and intrapartum course. The delivery was at [redacted]w[redacted]d gestational weeks.  Anesthesia: none. Postpartum course has been uncomplicated. Baby is doing well. Baby is feeding by both breast and bottle - Gerber Gentle. Bleeding no bleeding. Bowel function is normal. Bladder function is normal. Patient is sexually active. Contraception method is none. Postpartum depression screening: negative.   The pregnancy intention screening data noted above was reviewed. Potential methods of contraception were discussed. The patient elected to proceed with IUD or IUS at [facility] next week as scheduled.   Edinburgh Postnatal Depression Scale - 01/19/21 1531      Edinburgh Postnatal Depression Scale:  In the Past 7 Days   I have been able to laugh and see the funny side of things. 0    I have looked forward with enjoyment to things. 0    I have blamed myself unnecessarily when things went wrong. 0    I have been anxious or worried for no good reason. 2    I have felt scared or panicky for no good reason. 0    Things have been getting on top of me. 2    I have been so unhappy that I have had difficulty sleeping. 0    I have felt sad or miserable. 0    I have been so unhappy that I have been crying. 0    The thought of harming myself has occurred to me. 0    Edinburgh Postnatal Depression Scale Total 4           Health Maintenance Due  Topic Date Due  . PNEUMOCOCCAL POLYSACCHARIDE VACCINE AGE 74-64 HIGH RISK  Never done  . FOOT EXAM  Never done  . OPHTHALMOLOGY EXAM  Never done  . URINE MICROALBUMIN  Never done  . COVID-19 Vaccine (3 - Booster for Pfizer series) 07/09/2020  . HEMOGLOBIN A1C  12/18/2020    The following portions of the patient's history were reviewed and updated as  appropriate: allergies, current medications, past family history, past medical history, past social history, past surgical history and problem list.  Review of Systems Pertinent items are noted in HPI.  Objective:  BP 111/80   Pulse 87   Wt 124 lb 8 oz (56.5 kg)   LMP 03/15/2020   Breastfeeding Yes   BMI 23.52 kg/m    General:  alert and cooperative   Breasts:  not indicated  Lungs: clear to auscultation bilaterally  Heart:  regular rate and rhythm, S1, S2 normal, no murmur, click, rub or gallop  Abdomen: soft, non-tender; bowel sounds normal; no masses,  no organomegaly   Wound Not applicable  GU exam:  not indicated       Assessment:   Normal postpartum exam.  Pre-gestation, type 2 diaetes   Plan:   Essential components of care per ACOG recommendations:  1.  Mood and well being: Patient with negative depression screening today. Reviewed local resources for support.  - Patient tobacco use? No.   - hx of drug use? No.    2. Infant care and feeding:  -Patient currently breastmilk feeding? Yes. Discussed returning to work and pumping.  -Social determinants of health (SDOH) reviewed in EPIC. No concerns  3. Sexuality, contraception and birth spacing - Patient does not want a  pregnancy in the next year.  Desired family size is 3 children.  - Reviewed forms of contraception in tiered fashion. Patient desired IUD at [facility] next week.  - Discussed birth spacing of 18 months  4. Sleep and fatigue -Encouraged family/partner/community support of 4 hrs of uninterrupted sleep to help with mood and fatigue  5. Physical Recovery  - Discussed patients delivery and complications. She describes her labor as good. - Patient had a Vaginal, no problems at delivery. Patient had a no laceration. Perineal healing reviewed. Patient expressed understanding - Patient has urinary incontinence? No. - Patient is safe to resume physical and sexual activity  6.  Health Maintenance - HM due items  addressed Yes - Last pap smear  Diagnosis  Date Value Ref Range Status  06/19/2020   Final   - Negative for intraepithelial lesion or malignancy (NILM)   Pap smear not done at today's visit.  -Breast Cancer screening indicated? No.   7. Chronic Disease/Pregnancy Condition follow up: T2DM - PCP follow up suggested   8. Back pain - Advised NSAIDs, Ice and rest - If symptoms do not improve in 2-3 weeks will refer to Sports Medicine  9. Numbness of left leg - referral to neurology    Vonzella Nipple, PA-C [facility], [facility]

## 2021-01-21 ENCOUNTER — Telehealth: Payer: Self-pay | Admitting: *Deleted

## 2021-01-21 NOTE — Telephone Encounter (Signed)
Call to patient with Spanish interpretor from [place], [place], [place] [id number]. Advised Return to Work note is ready for pick-up at office.  Patient states will pick this up today.  Encounter closed.

## 2021-02-04 ENCOUNTER — Other Ambulatory Visit (HOSPITAL_COMMUNITY): Payer: Self-pay

## 2021-02-04 IMAGING — US US MFM OB LIMITED
1 series · 15 of 28 positions shown · non-contrast
Comparison: none

[Series 1: us mfm ob limited · 32 acquisitions, 15 frames shown]
[im 1/32]
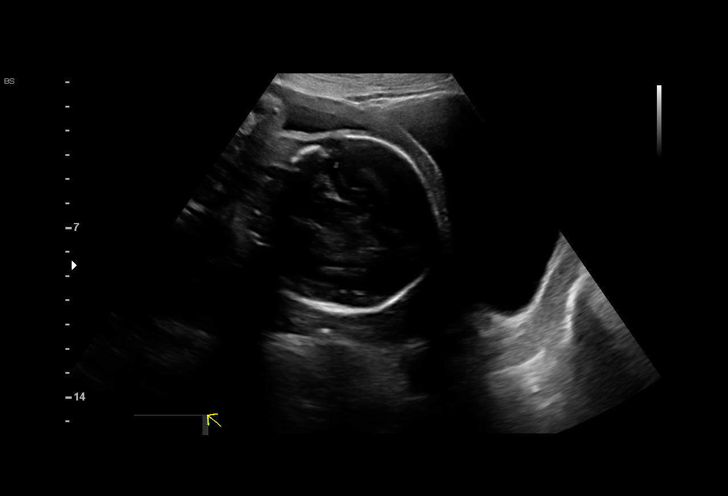
[im 3/32]
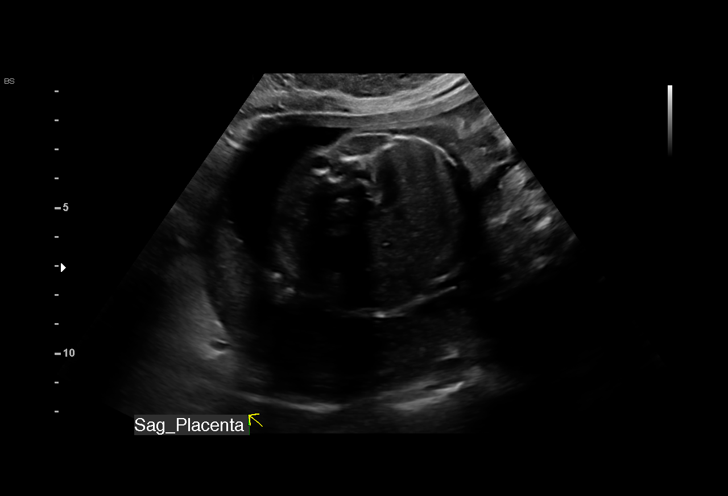
[im 5/32]
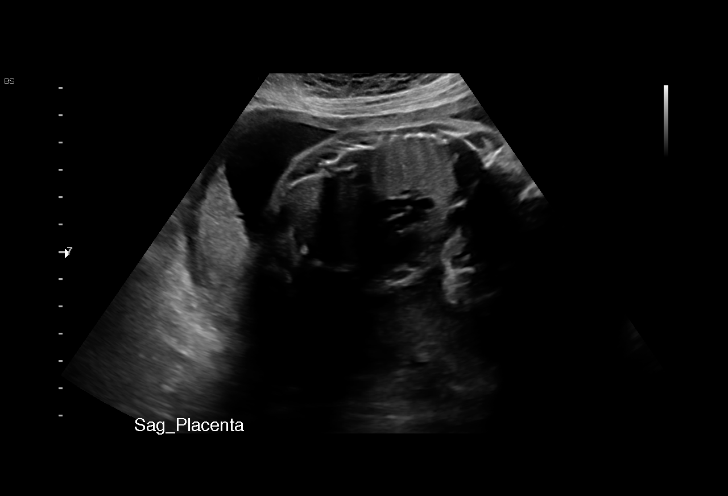
[im 7/32]
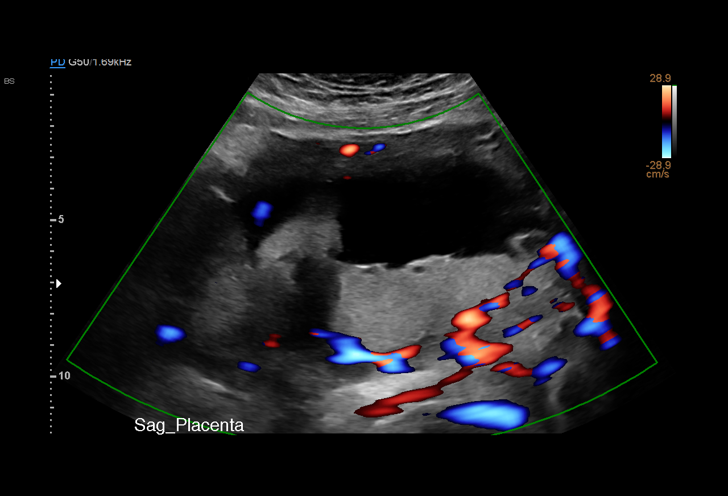
[im 10/32]
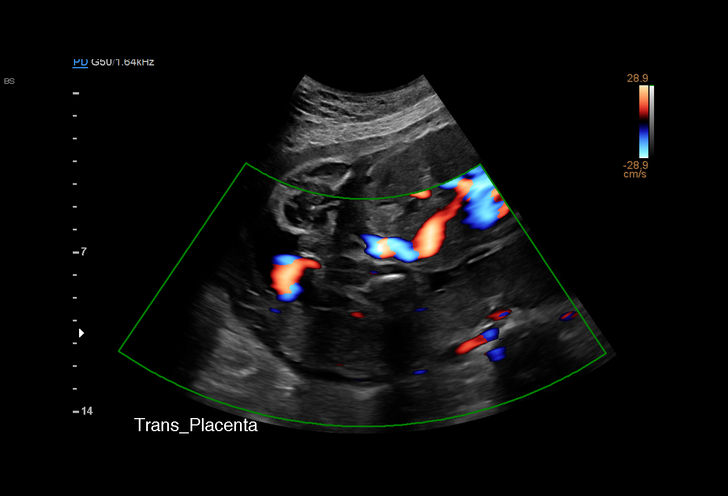
[im 12/32]
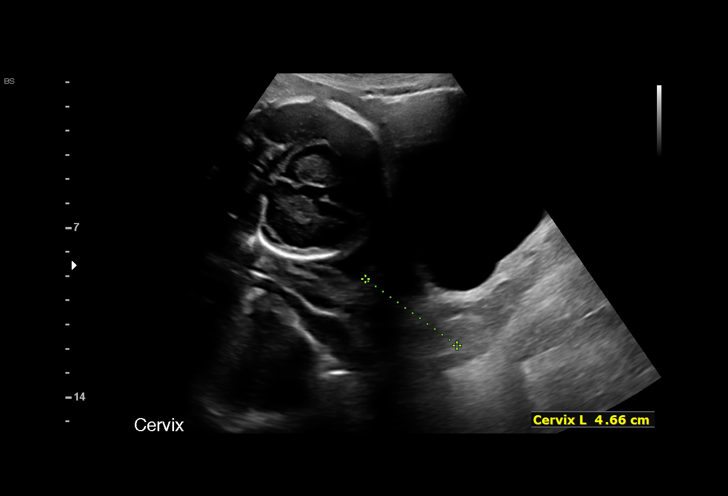
[im 14/32]
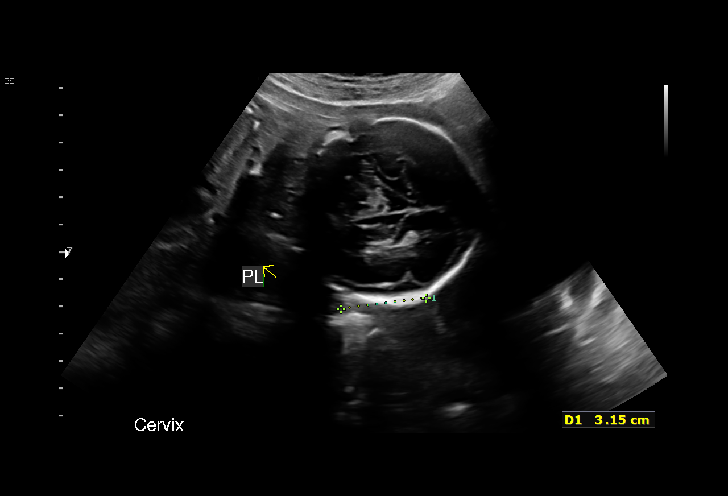
[im 17/32]
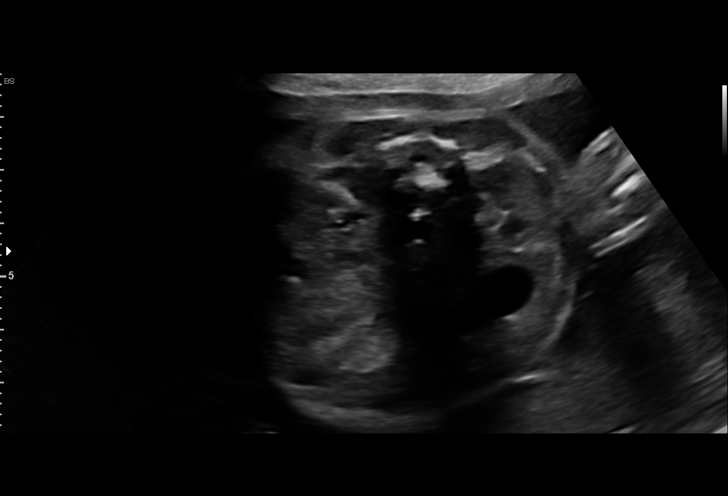
[im 18/32]
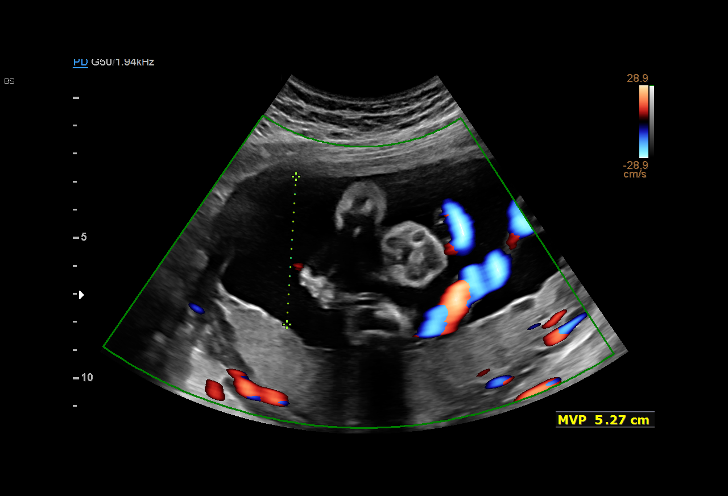
[im 20/32]
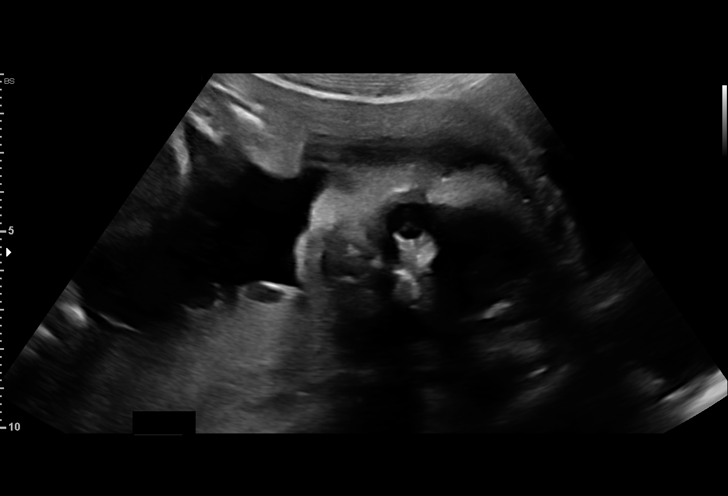
[im 22/32]
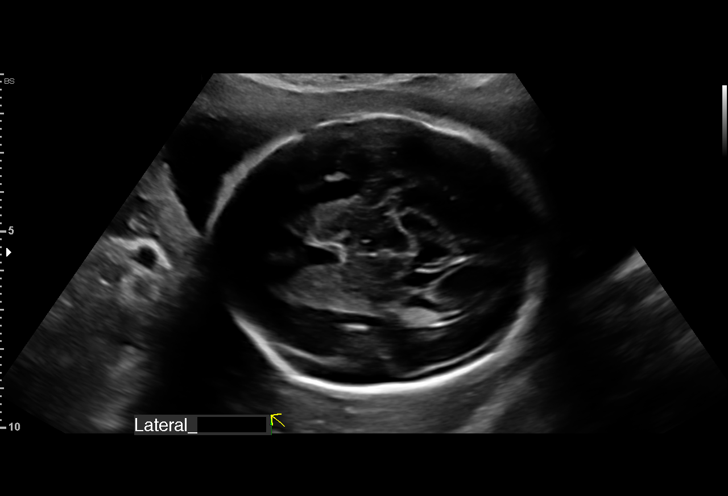
[im 25/32]
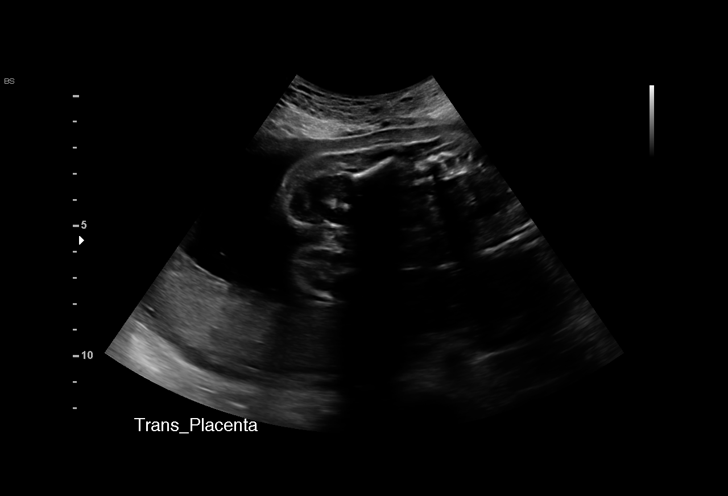
[im 27/32]
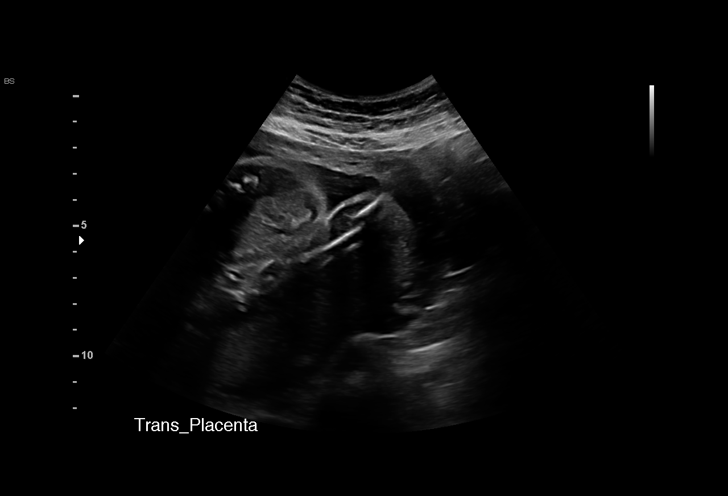
[im 29/32]
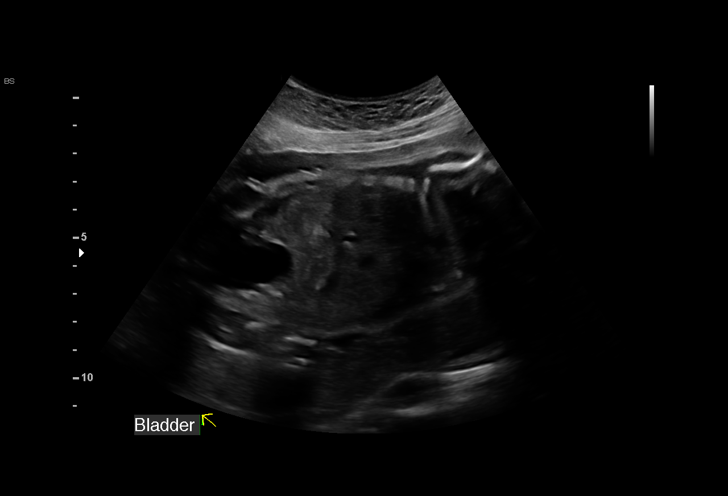
[im 32/32]
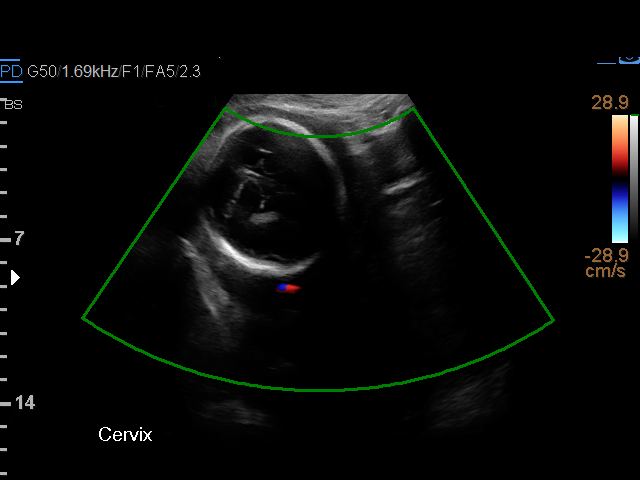

[15 of 28 positions shown; findings below may reference images not displayed]

[REDACTED]

 1  US MFM OB LIMITED                     76815.01    THONG

Indications

 25 weeks gestation of pregnancy
 Pre-existing diabetes, type 2, in pregnancy,
 second trimester (Metformin)
 Advanced maternal age multigravida 35+,
 second trimester
 Medical complication of pregnancy (Bipolar,
 Anxiety, Depression)
 Subchorionic hemorrhage, antepartum
 Poor obstetric history: Previous preterm
 delivery, antepartum
 LR NIPS
Fetal Evaluation

 Num Of Fetuses:         1
 Fetal Heart Rate(bpm):  138
 Cardiac Activity:       Observed
 Presentation:           Cephalic
 Placenta:               Posterior Fundal
 P. Cord Insertion:      Visualized, central

 Amniotic Fluid
 AFI FV:      Within normal limits

                             Largest Pocket(cm)

OB History

 Blood Type:   O+
 Gravidity:    3
 Living:       2
Gestational Age

 LMP:           26w 1d        Date:  03/15/20                 EDD:   12/20/20
 Best:          25w 1d     Det. By:  Early Ultrasound         EDD:   12/27/20
Anatomy

 Ventricles:            Appears normal         Kidneys:                Appear normal
 Stomach:               Appears normal, left   Bladder:                Appears normal
                        sided
Cervix Uterus Adnexa

 Cervix
 Length:           4.66  cm.
 Normal appearance by transabdominal scan.
Impression

 Limited exam secondary to type 2 diabetes and vaginal
 bleeding.
 Good fetal movement and amniotic fluid observed.
 No evidence of placenta previa or abruption.
Recommendations

 Clinical correlation recommended.

## 2021-03-19 IMAGING — US US MFM OB FOLLOW-UP
1 series · 13 of 28 positions shown · non-contrast
Comparison: none

[Series 1: us mfm ob follow-up · 42 acquisitions, 13 frames shown]
[im 2/42]
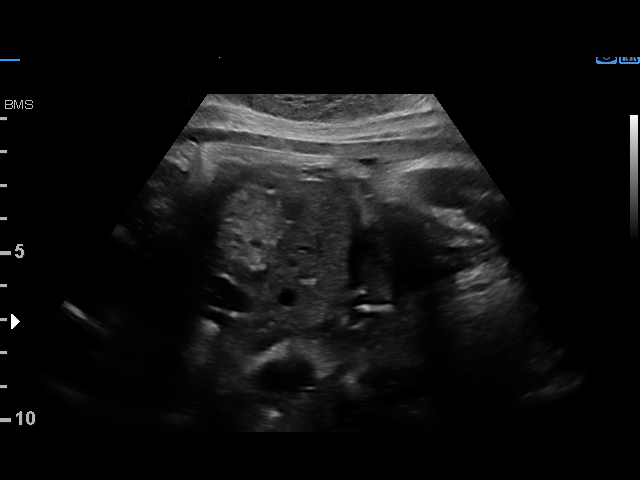
[im 5/42]
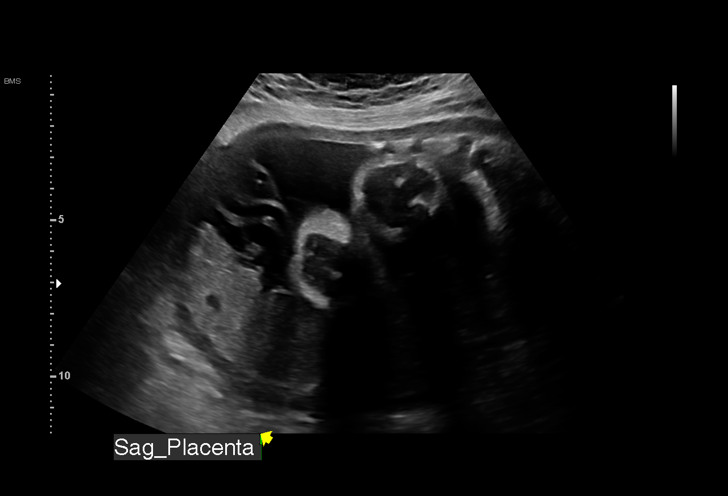
[im 8/42]
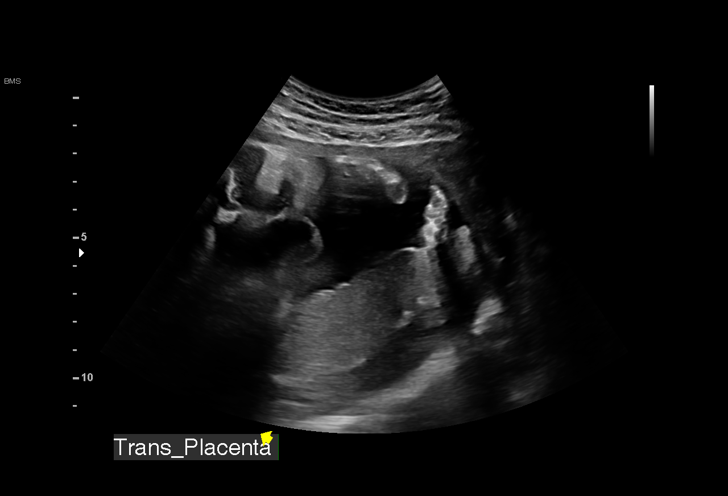
[im 11/42]
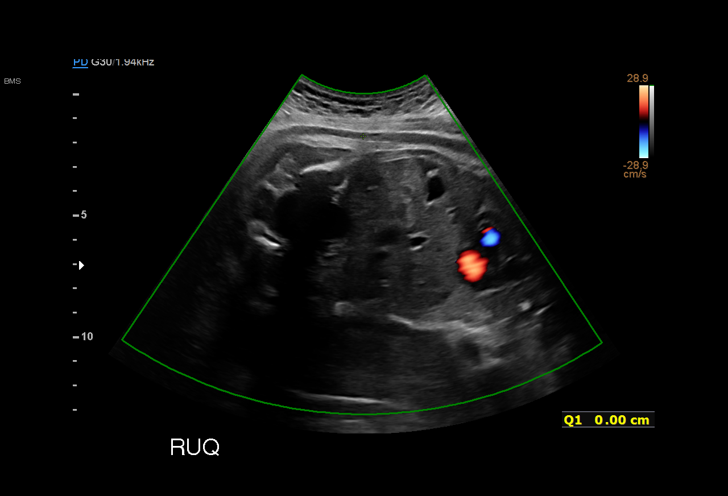
[im 14/42]
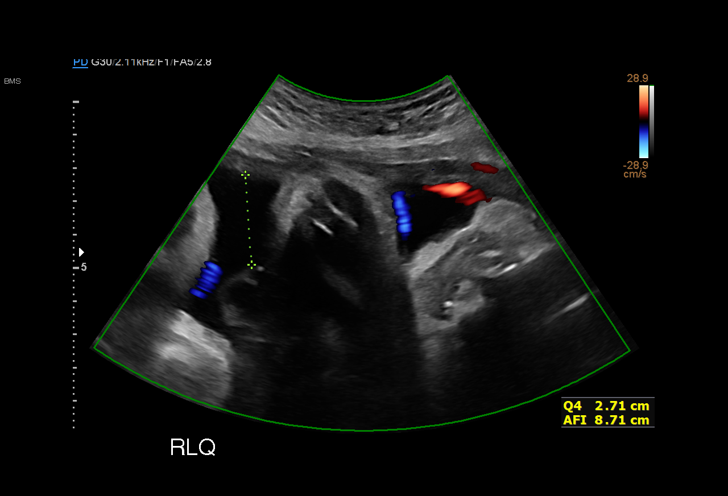
[im 17/42]
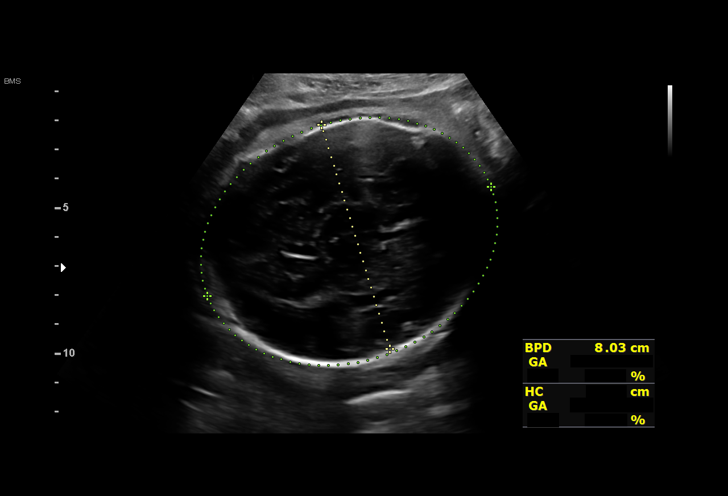
[im 22/42]
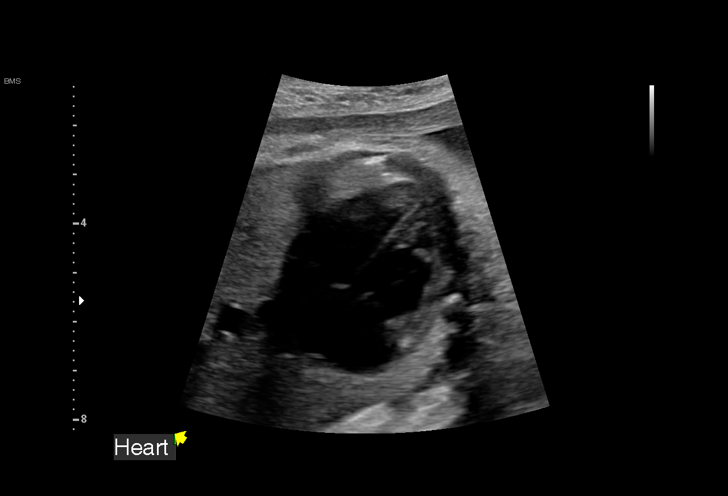
[im 25/42]
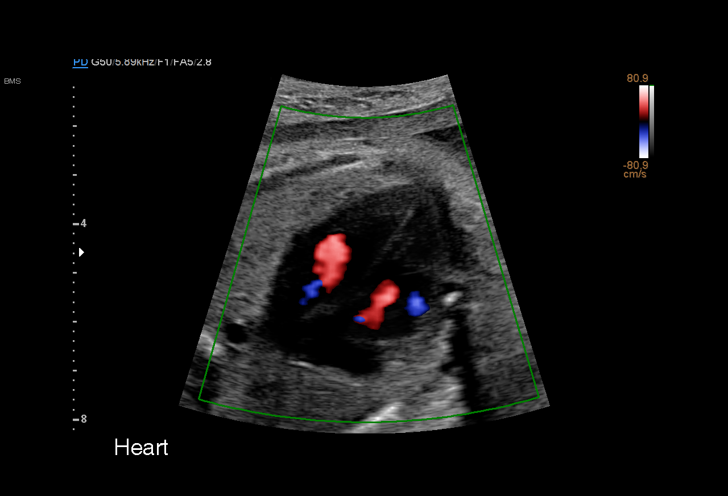
[im 28/42]
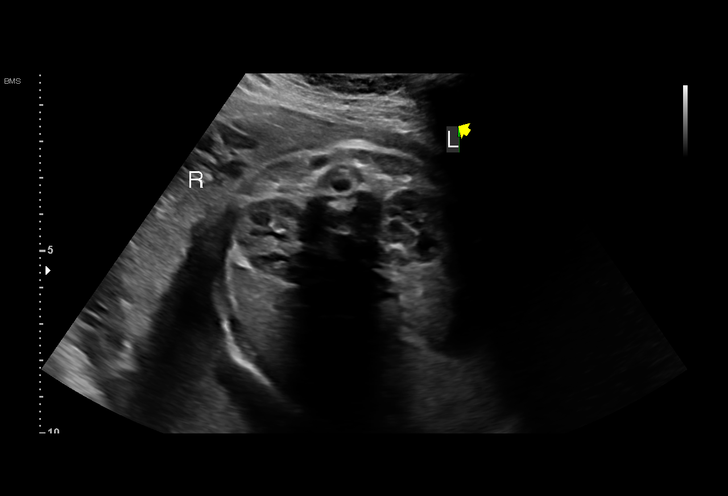
[im 31/42]
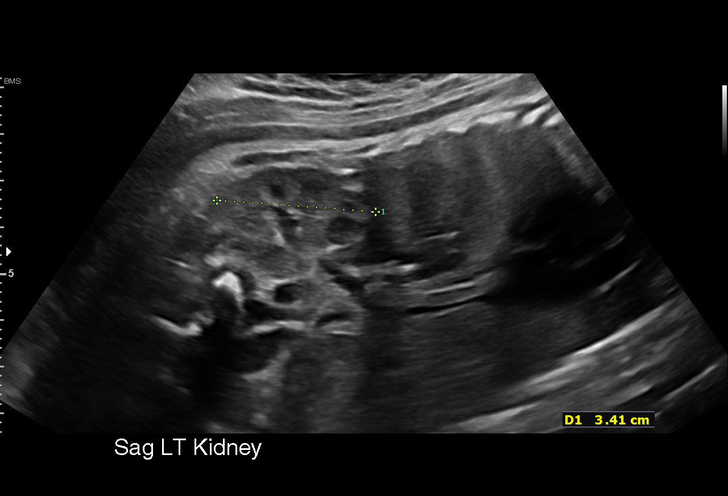
[im 34/42]
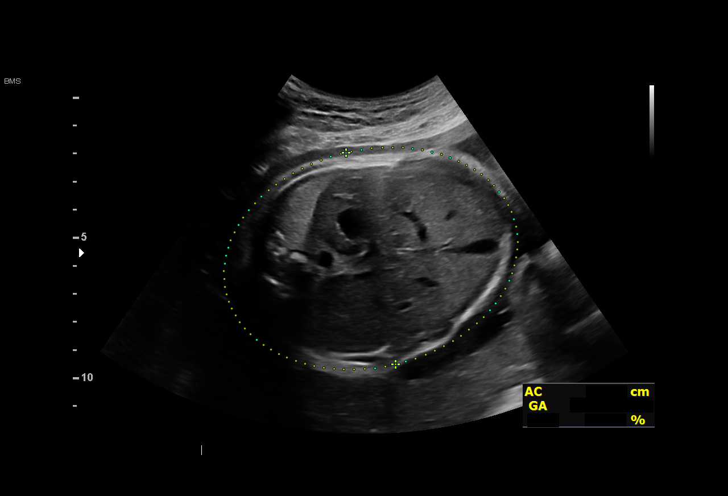
[im 37/42]
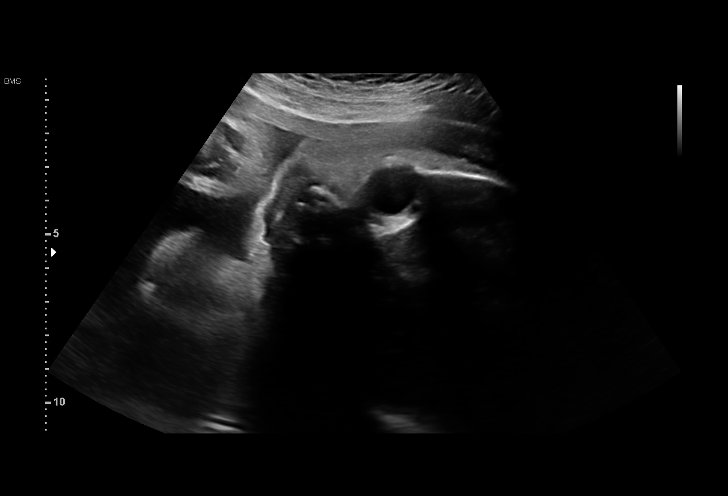
[im 40/42]
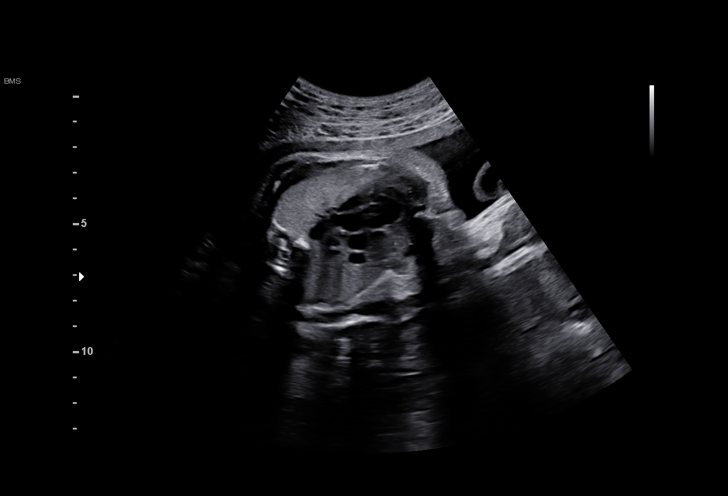

[13 of 28 positions shown; findings below may reference images not displayed]

[REDACTED]

Indications

 Pre-existing diabetes, type 2, in pregnancy,
 second trimester (Metformin)
 31 weeks gestation of pregnancy
 Encounter for other antenatal screening
 follow-up
 Advanced maternal age multigravida 35+,
 second trimester
 Medical complication of pregnancy (Bipolar,
 Anxiety, Depression)
 Subchorionic hemorrhage, antepartum
 Poor obstetric history: Previous preterm
 delivery, antepartum
 LR NIPS
Fetal Evaluation

 Num Of Fetuses:         1
 Fetal Heart Rate(bpm):  127
 Cardiac Activity:       Observed
 Presentation:           Cephalic
 Placenta:               Posterior
 P. Cord Insertion:      Previously Visualized

 Amniotic Fluid
 AFI FV:      Subjectively low-normal

 AFI Sum(cm)     %Tile       Largest Pocket(cm)

 RUQ(cm)       RLQ(cm)       LUQ(cm)        LLQ(cm)
 0
Biometry

 BPD:      80.9  mm     G. Age:  32w 3d         76  %    CI:        76.15   %    70 - 86
                                                         FL/HC:      20.1   %    19.3 -
 HC:      293.8  mm     G. Age:  32w 3d         44  %    HC/AC:      1.03        0.96 -
 AC:       285   mm     G. Age:  32w 4d         81  %    FL/BPD:     73.1   %    71 - 87
 FL:       59.1  mm     G. Age:  30w 6d         24  %    FL/AC:      20.7   %    20 - 24

 LV:        2.8  mm
 Est. FW:    4998  gm      4 lb 2 oz     63  %
OB History

 Blood Type:   O+
 Gravidity:    3
 Living:       2
Gestational Age

 LMP:           32w 2d        Date:  03/15/20                 EDD:   12/20/20
 U/S Today:     32w 1d                                        EDD:   12/21/20
 Best:          31w 2d     Det. By:  Early Ultrasound         EDD:   12/27/20
Anatomy

 Cranium:               Previously seen        LVOT:                   Previously seen
 Cavum:                 Previously seen        Aortic Arch:            Previously seen
 Ventricles:            Appears normal         Ductal Arch:            Previously seen
 Choroid Plexus:        Previously seen        Diaphragm:              Previously seen
 Cerebellum:            Previously seen        Stomach:                Appears normal, left
                                                                       sided
 Posterior Fossa:       Previously seen        Abdomen:                Appears normal
 Nuchal Fold:           Previously seen        Abdominal Wall:         Previously seen
 Face:                  Orbits and profile     Cord Vessels:           Previously seen
                        previously seen
 Lips:                  Previously seen        Kidneys:                Appear normal
 Palate:                Not well visualized    Bladder:                Appears normal
 Thoracic:              Previously seen        Spine:                  Previously seen
 Heart:                 Appears normal         Upper Extremities:      Previously seen
                        (4CH, axis, and
                        situs)
 RVOT:                  Previously seen        Lower Extremities:      Previously seen

 Other:  Hands and feet visualized previously. Fetus appears to be a male.
         Heels and 5th digit previously visualized.
Cervix Uterus Adnexa

 Cervix
 Not visualized (advanced GA >98wks)
Comments

 This patient was seen for a follow up growth scan due to
 pregestational diabetes treated with Metformin and a prior
 poor obstetrical history.  She denies any problems since her
 last exam.
 She was informed that the fetal growth and amniotic fluid
 level appears appropriate for her gestational age.
 Fetal movements were noted throughout today's exam.
 Due to diabetes in pregnancy that is treated with Metformin,
 we will continue to follow her with weekly biophysical profiles.
 A biophysical profile was scheduled in 1 week.

## 2021-03-25 ENCOUNTER — Encounter: Payer: Self-pay | Admitting: Neurology

## 2021-03-25 ENCOUNTER — Ambulatory Visit: Payer: Self-pay | Admitting: Neurology

## 2021-03-25 VITALS — BP 100/65 | HR 69 | Ht 61.0 in | Wt 125.2 lb

## 2021-03-25 DIAGNOSIS — Z8739 Personal history of other diseases of the musculoskeletal system and connective tissue: Secondary | ICD-10-CM

## 2021-03-25 DIAGNOSIS — R202 Paresthesia of skin: Secondary | ICD-10-CM

## 2021-03-25 DIAGNOSIS — R200 Anesthesia of skin: Secondary | ICD-10-CM

## 2021-03-25 NOTE — Progress Notes (Signed)
Subjective:    Patient ID: Catherine Morris is a 40 y.o. female.  HPI    Interim history:   [name] is a 40 year old right-handed woman, with an underlying medical history of diabetes, gestational diabetes, knee pain, and bipolar disorder (by chart review), who presents for evaluation of her numbness and tingling affecting her left lateral distal leg and left toes.  She is accompanied by a [place] interpreter today.  She is referred by her OB/GYN PA, Vonzella Nipple, and I reviewed the note from 01/19/2021.  Patient was previously evaluated on 10/14/2020 for left-sided low back pain with radiation to the left leg.  Today, 03/25/2021: She reports that her back pain improved.  Of note, she had her baby in the interim.  She reports that she has noticed in the past several weeks intermittent numbness and tingling affecting her toes 3 through 5 on the left side as well as sometimes lateral distal left leg.  The pain is intermittent, numbness is not complete, tingling is intermittent.  She has not noticed any weakness, has not had any falls, no upper body symptoms, no other new symptoms.  The patient's allergies, current medications, family history, past medical history, past social history, past surgical history and problem list were reviewed and updated as appropriate.   Previously:   10/14/20: (She) reports new onset left-sided low back pain with radiation to the left calf and left foot since approximately 09/18/2020.  Of note, she was hospitalized earlier in January for preterm labor and vaginal bleeding.  She received a steroid injection. I reviewed your office note from 10/07/2020.  She has been given a prescription for gabapentin for symptomatic treatment of her radiating back pain as well as a prescription for Percocet.   She reports that she has been on gabapentin for about a week.  Initially, she took it 3 times a day but now she takes 1 pill once a day or twice daily.  She has  been taking the Percocet which is written for 2 pills every 6 hours as needed.  She has typically taken only 1 pill at a time, up to twice daily, sometimes only at night.  She reports that the pain is better.  She did try heat application and cold, nothing really worked until she started the gabapentin and Percocet.  She tries to avoid taking medication as much as possible.  She reports that the pain is mostly in the left hip joint area and tends to radiate to the left calf and sometimes she has numbness and tingling in the lateral aspect of the left foot.  She has not had any foot drop, she has not had any saddle anesthesia or urinary incontinence or retention.  She has not fallen.  She does not use a walking aid.  She is able to walk but tries to avoid stairs.  She feels improved but decided to keep this appointment just in case.  Her Past Medical History Is Significant For: Past Medical History:  Diagnosis Date   Back pain    Bipolar disorder (HCC)    Depression    Diabetes mellitus without complication (HCC)    Gestational diabetes    Knee pain     Her Past Surgical History Is Significant For: Past Surgical History:  Procedure Laterality Date   NO PAST SURGERIES      Her Family History Is Significant For: Family History  Problem Relation Age of Onset   Hypertension Mother    Diabetes Father  Diabetes Brother     Her Social History Is Significant For: Social History   Socioeconomic History   Marital status: Single    Spouse name: Not on file   Number of children: Not on file   Years of education: Not on file   Highest education level: Not on file  Occupational History   Not on file  Tobacco Use   Smoking status: Never   Smokeless tobacco: Never  Vaping Use   Vaping Use: Never used  Substance and Sexual Activity   Alcohol use: No   Drug use: No   Sexual activity: Yes    Partners: Male  Other Topics Concern   Not on file  Social History Narrative   Not on file    Social Determinants of Health   Financial Resource Strain: Not on file  Food Insecurity: No Food Insecurity   Worried About Running Out of Food in the Last Year: Never true   Ran Out of Food in the Last Year: Never true  Transportation Needs: No Transportation Needs   Lack of Transportation (Medical): No   Lack of Transportation (Non-Medical): No  Physical Activity: Not on file  Stress: Not on file  Social Connections: Not on file    Her Allergies Are:  No Known Allergies:   Her Current Medications Are:  Outpatient Encounter Medications as of 03/25/2021  Medication Sig   metFORMIN (GLUCOPHAGE) 1000 MG tablet Take 1 tablet (1,000 mg total) by mouth 2 (two) times daily with a meal.   Prenatal Vit-Fe Fumarate-FA (PRENATAL MULTIVITAMIN) TABS tablet Take 1 tablet by mouth daily at 12 noon.   VITAMIN D PO Take 1,000 Units by mouth daily.   [DISCONTINUED] acetaminophen (TYLENOL) 325 MG tablet Take 2 tablets (650 mg total) by mouth every 6 (six) hours as needed for mild pain, moderate pain, fever or headache.   [DISCONTINUED] coconut oil OIL Apply 1 application topically as needed (nipple pain). (Patient not taking: Reported on 01/19/2021)   [DISCONTINUED] ibuprofen (ADVIL) 600 MG tablet Take 1 tablet (600 mg total) by mouth every 8 (eight) hours as needed for moderate pain or cramping. (Patient not taking: Reported on 01/19/2021)   No facility-administered encounter medications on file as of 03/25/2021.  :  Review of Systems:  Out of a complete 14 point review of systems, all are reviewed and negative with the exception of these symptoms as listed below:   Review of Systems  Neurological:        Since January, numbness/tingling  L 3 outer toes and upper outer foot, some tranisent pain.     Objective:  Neurological Exam  Physical Exam Physical Examination:   Vitals:   03/25/21 1028  BP: 100/65  Pulse: 69    General Examination: The patient is a very pleasant 40 y.o. female in  no acute distress. She appears well-developed and well-nourished and well groomed.   HEENT: Normocephalic, atraumatic, pupils are equal, round and reactive to light, extraocular tracking is well-preserved, hearing is grossly intact, face is symmetric with normal facial animation and normal facial sensation to LT.  Speech is clear without dysarthria, hypophonia or voice tremor.  Airway examination reveals mild mouth dryness, tongue protrudes centrally and palate elevates symmetrically.  Tongue movements are full.  No carotid bruits. Neck is supple with full range of motion.   Chest: Clear to auscultation without wheezing, rhonchi or crackles noted.   Heart: S1+S2+0, regular and normal without murmurs, rubs or gallops noted.   Abdomen: Soft, non-tender,  not distended.     Extremities: There is no pitting edema in the distal lower extremities bilaterally. Pedal pulses are intact.   Skin: Warm and dry without trophic changes noted. There are no varicose veins.   Musculoskeletal: exam reveals no obvious joint deformities, tenderness or joint swelling or erythema.  Denies any low back pain.   Neurologically: Mental status: The patient is awake, alert and oriented in all 4 spheres. Her immediate and remote memory, attention, language skills and fund of knowledge are appropriate. There is no evidence of aphasia, agnosia, apraxia or anomia. Speech is clear with normal prosody and enunciation. Thought process is linear. Mood is normal and affect is normal. Cranial nerves II - XII are as described above under HEENT exam. In addition: shoulder shrug is normal with equal shoulder height noted. Motor exam: Normal bulk, strength and tone is noted. In particular, no foot drop.  She has no drift, postural or resting tremor in the upper extremities, no intention tremor in the upper extremities, Romberg is negative.  Reflexes are 2+ in the upper extremities and knees, 1+ in the ankles bilaterally.  Toes are  downgoing.    Fine motor skills and coordination: intact with normal finger taps, normal hand movements, normal rapid alternating patting, normal foot taps and normal foot agility. Cerebellar testing: No dysmetria or intention tremor on finger to nose testing. Heel to shin is unremarkable bilaterally. There is no truncal or gait ataxia. Sensory exam: intact to light touch, pinprick, vibration, temperature sense in the upper and right lower extremities and mild decrease to pinprick and temperature sense in the left lateral calf area, no complete numbness.  She also reports more discomfort with temperature and vibration testing in her left toes 3-5.  Gait, station and balance: She stands without difficulty, posture is normal.  Tandem walk is normal.  Good ability to walk on her heels, toes stance normal and symmetrical.     Assessment and Plan:     In summary, Catherine Morris is a very pleasant 40 yo female with an underlying medical history of diabetes, gestational diabetes, knee pain, and bipolar disorder (by chart review), who presents for evaluation of her numbness and tingling affecting her left lateral distal leg and left toes, symptoms ongoing for the past few weeks.  She previously had sciatic pain on the left side with radiating low back pain.  This has actually improved.  Exam currently is quite benign.  Symptoms are intermittent.  She may have some nerve compression, symptoms and examination not telltale for peripheral neuropathy.  We had previously discussed further evaluation with a lumbar spine MRI but I would recommend that we hold off as she has improved with respect to her back pain.  If she were to have worsening and radiating low back pain, she is advised to talk to her primary care about a referral to see a spine specialist.  Referral to sports medicine was considered at the last visit with her OB/GYN provider.  From my end of things, I recommend we proceed with an EMG nerve  conduction velocity test through our office to evaluate for a nerve irritation/nerve compression.  Strength and reflexes are benign, sensory exam is also fairly benign today.  She is largely reassured.  So long as the EMG nerve conduction velocity test shows benign findings, she can follow-up in this clinic on an as-needed basis.  She is advised to follow-up with her OB/GYN as scheduled and discuss further evaluation for recurrence of back  pain as needed.  We will call her with her EMG results.  I answered all her questions today and the patient was in agreement with the plan.  I spent 30 minutes in total face-to-face time and in reviewing records during pre-charting, more than 50% of which was spent in counseling and coordination of care, reviewing test results, reviewing medications and treatment regimen and/or in discussing or reviewing the diagnosis of numbness and tingling of the left lower extremity, the prognosis and treatment options. Pertinent laboratory and imaging test results that were available during this visit with the patient were reviewed by me and considered in my medical decision making (see chart for details).

## 2021-03-25 NOTE — Patient Instructions (Signed)
It was nice to see you again today.  I am glad to hear that your back pain has improved.  For your numbness, tingling and pain of your toes on the left as well as your lower leg on the left, we we will investigate with an EMG and nerve conduction velocity test, which is an electrical nerve and muscle test, which we will schedule. We will call you with the results. So long as your test results are benign we can have you follow-up with your primary care.  If your back pain becomes worse especially if you have shooting pains down your left leg, please talk to your primary care about a referral to see a spine specialist next.

## 2021-04-15 ENCOUNTER — Other Ambulatory Visit: Payer: Self-pay

## 2021-04-15 ENCOUNTER — Telehealth: Payer: Self-pay | Admitting: *Deleted

## 2021-04-15 ENCOUNTER — Encounter (INDEPENDENT_AMBULATORY_CARE_PROVIDER_SITE_OTHER): Payer: Self-pay | Admitting: Diagnostic Neuroimaging

## 2021-04-15 ENCOUNTER — Ambulatory Visit (INDEPENDENT_AMBULATORY_CARE_PROVIDER_SITE_OTHER): Payer: Self-pay | Admitting: Diagnostic Neuroimaging

## 2021-04-15 DIAGNOSIS — R202 Paresthesia of skin: Secondary | ICD-10-CM

## 2021-04-15 DIAGNOSIS — Z0289 Encounter for other administrative examinations: Secondary | ICD-10-CM

## 2021-04-15 DIAGNOSIS — Z8739 Personal history of other diseases of the musculoskeletal system and connective tissue: Secondary | ICD-10-CM

## 2021-04-15 DIAGNOSIS — R200 Anesthesia of skin: Secondary | ICD-10-CM

## 2021-04-15 NOTE — Procedures (Signed)
   [facility]  NCS (NERVE CONDUCTION STUDY) WITH EMG (ELECTROMYOGRAPHY) REPORT   STUDY DATE: 04/15/21 PATIENT NAME: Catherine Morris DOB: [birth year] MRN: [id number]  ORDERING CLINICIAN: Huston Foley, MD PhD   TECHNOLOGIST: Durenda Age ELECTROMYOGRAPHER: Glenford Bayley. Nazifa Trinka, MD  CLINICAL INFORMATION: 40 year old female with left foot / toes numbness.  FINDINGS: NERVE CONDUCTION STUDY:  Left peroneal and left tibial motor responses are normal.  Left sural and left superficial peroneal sensory responses are normal.  Left tibial F-wave latency is normal.   NEEDLE ELECTROMYOGRAPHY:  Needle examination of left lower extremity vastus medialis, tibialis anterior, gastrocnemius is normal.   IMPRESSION:   Normal study.  No electrodiagnostic evidence of large fiber neuropathy this time.   INTERPRETING PHYSICIAN:  Suanne Marker, MD Certified in Neurology, Neurophysiology and Neuroimaging  [facility] [address], [address] [place], [place] [address] [phone]   [facility]    Nerve / Sites Muscle Latency Ref. Amplitude Ref. Rel Amp Segments Distance Velocity Ref. Area    ms ms mV mV %  cm m/s m/s mVms  L Peroneal - EDB     Ankle EDB 4.5 ?6.5 3.1 ?2.0 100 Ankle - EDB 9   14.1     Fib head EDB 9.8  3.0  96.8 Fib head - Ankle 23 44 ?44 10.9     Pop fossa EDB 12.1  3.1  103 Pop fossa - Fib head 10 44 ?44 11.1         Pop fossa - Ankle      L Tibial - AH     Ankle AH 3.7 ?5.8 8.2 ?4.0 100 Ankle - AH 9   15.0     Pop fossa AH 11.7  5.8  71 Pop fossa - Ankle 34 43 ?41 13.5         SNC    Nerve / Sites Rec. Site Peak Lat Ref.  Amp Ref. Segments Distance    ms ms V V  cm  L Sural - Ankle (Calf)     Calf Ankle 3.3 ?4.4 19 ?6 Calf - Ankle 14  L Superficial peroneal - Ankle     Lat leg Ankle 3.6 ?4.4 11 ?6 Lat leg - Ankle 14         F  Wave    Nerve F Lat Ref.   ms ms  L Tibial - AH 46.0 ?56.0       EMG Summary Table     Spontaneous MUAP Recruitment  Muscle IA Fib PSW Fasc Other Amp Dur. Poly Pattern  L. Vastus medialis Normal None None None _______ Normal Normal Normal Normal  L. Tibialis anterior Normal None None None _______ Normal Normal Normal Normal  L. Gastrocnemius (Medial head) Normal None None None _______ Normal Normal Normal Normal

## 2021-04-15 NOTE — Telephone Encounter (Signed)
I called pt thru language interpreters [place] Shows [phone], relayed to have her return call at her convenience for test results as she did not pick up.  Sent mychart message as well.

## 2021-04-15 NOTE — Telephone Encounter (Signed)
-----   Message from Huston Foley, MD sent at 04/15/2021  3:57 PM EDT ----- Interpreter needed.   Please call and advise the patient that the recent EMG and nerve conduction velocity test, which is the electrical nerve and muscle test we we performed, was reported as within normal limits. We checked for abnormal electrical discharges in the muscles or nerves in the L leg, and the report suggested normal findings. No further action is required on this test at this time.  At this juncture, as discussed, she can follow-up with her primary care.

## 2021-04-21 NOTE — Telephone Encounter (Signed)
I called the patient using [employer] spanish speaking representative, Leavy Cella, and I discussed results with patient for EMG nerve conduction study  which suggested normal findings.  The pt verbalized understanding and she did not have any questions.  She was advised to follow-up with primary care.

## 2023-07-27 ENCOUNTER — Ambulatory Visit: Payer: Self-pay | Admitting: Podiatry

## 2023-07-31 ENCOUNTER — Encounter: Payer: Self-pay | Admitting: Podiatry

## 2023-07-31 ENCOUNTER — Ambulatory Visit (INDEPENDENT_AMBULATORY_CARE_PROVIDER_SITE_OTHER): Payer: Self-pay | Admitting: Podiatry

## 2023-07-31 VITALS — Ht 61.0 in | Wt 125.0 lb

## 2023-07-31 DIAGNOSIS — M7751 Other enthesopathy of right foot: Secondary | ICD-10-CM

## 2023-07-31 MED ORDER — BETAMETHASONE SOD PHOS & ACET 6 (3-3) MG/ML IJ SUSP
3.0000 mg | Freq: Once | INTRAMUSCULAR | Status: AC
  Administered 2023-07-31: 3 mg via INTRA_ARTICULAR

## 2023-07-31 NOTE — Progress Notes (Signed)
   Chief Complaint  Patient presents with   Hammer Toe    DMII ( uncontrolled) Patient is here for callus removal between 4th and 5th right toe    HPI: 42 y.o. female presenting today for follow-up evaluation of pain and tenderness associated to her hammertoe to the right fourth digit.  Patient states that she was doing well until about 2 months ago when she began to have pain and tenderness secondary to a tight pair of shoes.  Past Medical History:  Diagnosis Date   Back pain    Bipolar disorder (HCC)    Depression    Diabetes mellitus without complication (HCC)    Gestational diabetes    Knee pain      Physical Exam: General: The patient is alert and oriented x3 in no acute distress.  Dermatology: Skin is warm, dry and supple bilateral lower extremities. Negative for open lesions or macerations.  Vascular: Palpable pedal pulses bilaterally. No edema or erythema noted. Capillary refill within normal limits.  Neurological: Grossly intact via light touch  Musculoskeletal Exam: Range of motion within normal limits to all pedal and ankle joints bilateral. Muscle strength 5/5 in all groups bilateral. Hypertrophic head of the proximal phalanx of the right fourth toe noted with palpation.  There is some sensitivity to palpation as well.  The hypertrophic head of the proximal phalanx is most evident on the lateral aspect that pushes against the fifth toe.  Assessment: 1.  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  Capsulitis right fourth toe   Plan of Care:  -Patient evaluated.  -Currently the patient does not want to pursue surgery.  She says that she received an injection by Dr. Elijah Birk, retired podiatrist and it helped for about 3 years.  She is requesting injection today -Injection 0.5 cc Celestone Soluspan injected around the PIPJ of the right fourth digit -Recommend wiping shoes that do not irritate or constrict the toebox -Return to clinic as needed       Felecia Shelling, DPM [facility]  Dr. Felecia Shelling, DPM    [address] [place], [place] [address]                Office [phone]  Fax [phone]

## 2024-08-16 ENCOUNTER — Encounter: Payer: Self-pay | Admitting: Gastroenterology

## 2024-10-01 ENCOUNTER — Ambulatory Visit: Admitting: Gastroenterology

## 2024-10-01 ENCOUNTER — Encounter: Payer: Self-pay | Admitting: Gastroenterology

## 2024-10-01 ENCOUNTER — Other Ambulatory Visit

## 2024-10-01 VITALS — BP 116/70 | HR 84 | Ht 61.0 in | Wt 145.0 lb

## 2024-10-01 DIAGNOSIS — R748 Abnormal levels of other serum enzymes: Secondary | ICD-10-CM

## 2024-10-01 DIAGNOSIS — K760 Fatty (change of) liver, not elsewhere classified: Secondary | ICD-10-CM

## 2024-10-01 LAB — COMPREHENSIVE METABOLIC PANEL WITH GFR
ALT: 40 U/L — ABNORMAL HIGH (ref 3–35)
AST: 22 U/L (ref 5–37)
Albumin: 4.8 g/dL (ref 3.5–5.2)
Alkaline Phosphatase: 116 U/L (ref 39–117)
BUN: 12 mg/dL (ref 6–23)
CO2: 28 meq/L (ref 19–32)
Calcium: 9.7 mg/dL (ref 8.4–10.5)
Chloride: 103 meq/L (ref 96–112)
Creatinine, Ser: 0.51 mg/dL (ref 0.40–1.20)
GFR: 115.47 mL/min
Glucose, Bld: 116 mg/dL — ABNORMAL HIGH (ref 70–99)
Potassium: 4.1 meq/L (ref 3.5–5.1)
Sodium: 138 meq/L (ref 135–145)
Total Bilirubin: 0.3 mg/dL (ref 0.2–1.2)
Total Protein: 8.4 g/dL — ABNORMAL HIGH (ref 6.0–8.3)

## 2024-10-01 LAB — CBC WITH DIFFERENTIAL/PLATELET
Basophils Absolute: 0 K/uL (ref 0.0–0.1)
Basophils Relative: 0.6 % (ref 0.0–3.0)
Eosinophils Absolute: 0.1 K/uL (ref 0.0–0.7)
Eosinophils Relative: 2.4 % (ref 0.0–5.0)
HCT: 38.9 % (ref 36.0–46.0)
Hemoglobin: 13.6 g/dL (ref 12.0–15.0)
Lymphocytes Relative: 37.3 % (ref 12.0–46.0)
Lymphs Abs: 1.9 K/uL (ref 0.7–4.0)
MCHC: 34.9 g/dL (ref 30.0–36.0)
MCV: 88 fl (ref 78.0–100.0)
Monocytes Absolute: 0.3 K/uL (ref 0.1–1.0)
Monocytes Relative: 5.6 % (ref 3.0–12.0)
Neutro Abs: 2.8 K/uL (ref 1.4–7.7)
Neutrophils Relative %: 54.1 % (ref 43.0–77.0)
Platelets: 394 K/uL (ref 150.0–400.0)
RBC: 4.42 Mil/uL (ref 3.87–5.11)
RDW: 12.4 % (ref 11.5–15.5)
WBC: 5.2 K/uL (ref 4.0–10.5)

## 2024-10-01 LAB — IBC + FERRITIN
Ferritin: 36.9 ng/mL (ref 10.0–291.0)
Iron: 109 ug/dL (ref 42–145)
Saturation Ratios: 25.9 % (ref 20.0–50.0)
TIBC: 421.4 ug/dL (ref 250.0–450.0)
Transferrin: 301 mg/dL (ref 212.0–360.0)

## 2024-10-01 LAB — TSH: TSH: 1.81 u[IU]/mL (ref 0.35–5.50)

## 2024-10-01 LAB — PROTIME-INR
INR: 1 ratio (ref 0.8–1.0)
Prothrombin Time: 11.3 s (ref 9.6–13.1)

## 2024-10-01 NOTE — Patient Instructions (Addendum)
-   Abstain from all alcohol including beer, wine, liquor, and non-alcoholic beer.  - Work to maintain a healthy weight through portion control and exercise  - Maximize control of any hyperglycemia and hyperlipidemia   Please go to the lab in the basement of our building to have lab work done as you leave today. Hit B for basement when you get on the elevator.  When the doors open the lab is on your left.  We will call you with the results. Thank you.  _______________________________________________________  If your blood pressure at your visit was 140/90 or greater, please contact your primary care physician to follow up on this.  _______________________________________________________  If you are age 3 or older, your body mass index should be between 23-30. Your Body mass index is 27.4 kg/m. If this is out of the aforementioned range listed, please consider follow up with your Primary Care Provider.  If you are age 60 or younger, your body mass index should be between 19-25. Your Body mass index is 27.4 kg/m. If this is out of the aformentioned range listed, please consider follow up with your Primary Care Provider.   ________________________________________________________  The [facility] providers would like to encourage you to use MYCHART to communicate with providers for non-urgent requests or questions.  Due to long hold times on the telephone, sending your provider a message by [facility] may be a faster and more efficient way to get a response.  Please allow 48 business hours for a response.  Please remember that this is for non-urgent requests.  _______________________________________________________  [facility] is using a team-based approach to care.  Your team is made up of your doctor and two to three APPS. Our APPS (Nurse Practitioners and Physician Assistants) work with your physician to ensure care continuity for you. They are fully qualified to address your health  concerns and develop a treatment plan. They communicate directly with your gastroenterologist to care for you. Seeing the Advanced Practice Practitioners on your physician's team can help you by facilitating care more promptly, often allowing for earlier appointments, access to diagnostic testing, procedures, and other specialty referrals.   Due to recent changes in healthcare laws, you may see the results of your imaging and laboratory studies on MyChart before your provider has had a chance to review them.  We understand that in some cases there may be results that are confusing or concerning to you. Not all laboratory results come back in the same time frame and the provider may be waiting for multiple results in order to interpret others.  Please give us  48 hours in order for your provider to thoroughly review all the results before contacting the office for clarification of your results.

## 2024-10-01 NOTE — Progress Notes (Signed)
 "  Catherine Morris [id number] June 07, 1981   Chief Complaint: Fatty liver  Referring Provider: Montey Presto, MD Primary GI MD: [place]  HPI: Catherine Morris is a 44 y.o. female with past medical history of bipolar disorder, depression, diabetes who presents today for evaluation of hepatic steatosis.    Patient is referred for evaluation of hepatic steatosis. Liver ultrasound 05/14/2024 showed diffuse hepatic steatosis and otherwise normal.  Labs 04/18/2024: Normal vitamin D , hemoglobin A1c 6.7, elevated LDL cholesterol, low HDL cholesterol, alk phos 865, AST 30, ALT 47, albumin 4.6, total bilirubin 0.4, normal GGT 36   Discussed the use of AI scribe software for clinical note transcription with the patient, who gave verbal consent to proceed.  History of Present Illness Catherine Morris is a 44 year old female who presents for evaluation of hepatic steatosis.  Patient speaks Spanish and an interpreter is present for the visit today.  Hepatic Steatosis: - Abdominal ultrasound within the past year demonstrated fatty infiltration of the liver with otherwise normal findings - No history of hepatitis, other liver infections, intravenous drug use, or alcohol consumption - No prior abdominal surgeries - No family history of liver disease or cirrhosis - No abdominal pain, swelling, rectal bleeding, melena, nausea, vomiting, fever, or jaundice  Gastrointestinal Symptoms: - Occasional diarrhea - Otherwise normal bowel movements  Cardiometabolic Risk Factors: - Personal history of hyperglycemia and hypercholesterolemia - Family history of diabetes (father, mother, and brother affected)  Constitutional and Cardiorespiratory Symptoms: - No chest pain or shortness of breath   Previous GI Procedures/Imaging      Past Medical History:  Diagnosis Date   Back pain    Bipolar disorder (HCC)    Depression    Diabetes mellitus without  complication (HCC)    Gestational diabetes    Knee pain     Past Surgical History:  Procedure Laterality Date   NO PAST SURGERIES      Current Outpatient Medications  Medication Sig Dispense Refill   metFORMIN  (GLUCOPHAGE ) 1000 MG tablet Take 1 tablet (1,000 mg total) by mouth 2 (two) times daily with a meal. 60 tablet 2   VITAMIN D  PO Take 1,000 Units by mouth daily.     Prenatal Vit-Fe Fumarate-FA (PRENATAL MULTIVITAMIN) TABS tablet Take 1 tablet by mouth daily at 12 noon. (Patient not taking: Reported on 10/01/2024)     No current facility-administered medications for this visit.    Allergies as of 10/01/2024   (No Known Allergies)    Family History  Problem Relation Age of Onset   Hypertension Mother    Diabetes Father    Diabetes Brother     Social History[1]   Review of Systems:    Constitutional: No weight loss, fever, chills, weakness or fatigue Eyes: No change in vision Ears, Nose, Throat:  No change in hearing or congestion Skin: No rash or itching Cardiovascular: No chest pain, chest pressure or palpitations   Respiratory: No SOB or cough Gastrointestinal: See HPI and otherwise negative Genitourinary: No dysuria or change in urinary frequency Neurological: No headache, dizziness or syncope Musculoskeletal: No new muscle or joint pain Hematologic: No bleeding or bruising    Physical Exam:  Vital signs: Ht 5' 1 (1.549 m)   Wt 145 lb (65.8 kg)   BMI 27.40 kg/m   Wt Readings from Last 3 Encounters:  10/01/24 145 lb (65.8 kg)  07/31/23 125 lb (56.7 kg)  03/25/21 125 lb 3.2 oz (56.8 kg)     Constitutional: Pleasant, well-appearing female in  NAD, alert and cooperative Head:  Normocephalic and atraumatic.  Respiratory: Respirations even and unlabored. Lungs clear to auscultation bilaterally.  No wheezes, crackles, or rhonchi.  Cardiovascular:  Regular rate and rhythm. No murmurs. No peripheral edema. Gastrointestinal:  Soft, nondistended, nontender.  No rebound or guarding. Normal bowel sounds. No appreciable masses or hepatomegaly. Rectal:  Not performed.  Neurologic:  Alert and oriented x4;  grossly normal neurologically.  Skin:   Dry and intact without significant lesions or rashes. Psychiatric: Oriented to person, place and time. Demonstrates good judgement and reason without abnormal affect or behaviors.   Assessment/Plan:   Assessment & Plan Hepatic steatosis Elevated liver enzymes Suspected metabolic dysfunction associated steatohepatitis (MASH, formerly NASH) by history and imaging. Must exclude other chronic causes of hepatocellular inflammation that can mimic fatty liver on imaging.  - Labs today: CBC, CMP, PT/INR, TSH, HepCAb, HepBsAg, HepBsAb, HepBcAb, HAV, Iron panel, ferritin, IgG, ANA, ASMA, AMA, TTG IgA, IgA, Alpha-one-antitrypsin level, Ceruloplasmin - Calculate FIB-4 from labs today - Consider elastography/FibroScan if FIB-4 suggests advanced fibrosis.  - Abstain from all alcohol including beer, wine, liquor, and non-alcoholic beer.  - Work to maintain a healthy weight through portion control and exercise  - Scheduled follow-up to review labs and determine further management. - Recommended liver function tests every six months to monitor progression. - Advised continued management of hyperglycemia and hyperlipidemia with primary care.   Camie Furbish, PA-C [facility] 10/01/2024, 8:56 AM  Patient Care Team: Patient, No Pcp Per as PCP - General (General Practice)       [1]  Social History Tobacco Use   Smoking status: Never   Smokeless tobacco: Never  Vaping Use   Vaping status: Never Used  Substance Use Topics   Alcohol use: No   Drug use: No   "

## 2024-10-03 LAB — HEPATITIS A ANTIBODY, TOTAL: Hepatitis A AB,Total: REACTIVE — AB

## 2024-10-03 LAB — ALPHA-1-ANTITRYPSIN: A-1 Antitrypsin, Ser: 142 mg/dL (ref 83–199)

## 2024-10-03 LAB — ANTI-SMOOTH MUSCLE ANTIBODY, IGG: Actin (Smooth Muscle) Antibody (IGG): <20 U

## 2024-10-03 LAB — MITOCHONDRIAL ANTIBODIES: Mitochondrial M2 Ab, IgG: 40.5 U — ABNORMAL HIGH

## 2024-10-03 LAB — ANA: Anti Nuclear Antibody (ANA): NEGATIVE

## 2024-10-03 LAB — HEPATITIS C ANTIBODY: Hepatitis C Ab: NONREACTIVE

## 2024-10-03 LAB — HEPATITIS B SURFACE ANTIBODY,QUALITATIVE: Hep B S Ab: REACTIVE — AB

## 2024-10-03 LAB — HEPATITIS B CORE ANTIBODY, TOTAL: Hep B Core Total Ab: NONREACTIVE

## 2024-10-03 LAB — HEPATITIS B SURFACE ANTIGEN: Hepatitis B Surface Ag: NONREACTIVE

## 2024-10-03 LAB — CERULOPLASMIN: Ceruloplasmin: 33 mg/dL (ref 14–48)

## 2024-10-03 LAB — IGA: Immunoglobulin A: 214 mg/dL (ref 47–310)

## 2024-10-08 ENCOUNTER — Ambulatory Visit: Payer: Self-pay | Admitting: Gastroenterology

## 2024-10-10 MED ORDER — URSODIOL 300 MG PO CAPS: 300.0000 mg | ORAL_CAPSULE | Freq: Three times a day (TID) | ORAL | 2 refills | Status: AC

## 2024-11-29 ENCOUNTER — Ambulatory Visit: Admitting: Gastroenterology
